# Patient Record
Sex: Female | Born: 1965 | Race: Black or African American | Hispanic: No | Marital: Single | Smoking: Former smoker
Health system: Southern US, Community
[De-identification: ages and names within clinical notes are randomized; demographics above are authoritative.]

## PROBLEM LIST (undated history)

## (undated) DIAGNOSIS — F329 Major depressive disorder, single episode, unspecified: Secondary | ICD-10-CM

## (undated) DIAGNOSIS — N898 Other specified noninflammatory disorders of vagina: Secondary | ICD-10-CM

## (undated) DIAGNOSIS — K219 Gastro-esophageal reflux disease without esophagitis: Secondary | ICD-10-CM

## (undated) DIAGNOSIS — F32A Depression, unspecified: Secondary | ICD-10-CM

## (undated) DIAGNOSIS — E119 Type 2 diabetes mellitus without complications: Secondary | ICD-10-CM

## (undated) DIAGNOSIS — D259 Leiomyoma of uterus, unspecified: Secondary | ICD-10-CM

## (undated) DIAGNOSIS — E785 Hyperlipidemia, unspecified: Secondary | ICD-10-CM

## (undated) DIAGNOSIS — I10 Essential (primary) hypertension: Secondary | ICD-10-CM

## (undated) DIAGNOSIS — Z720 Tobacco use: Secondary | ICD-10-CM

## (undated) HISTORY — DX: Major depressive disorder, single episode, unspecified: F32.9

## (undated) HISTORY — DX: Tobacco use: Z72.0

## (undated) HISTORY — DX: Essential (primary) hypertension: I10

## (undated) HISTORY — DX: Depression, unspecified: F32.A

## (undated) HISTORY — DX: Gastro-esophageal reflux disease without esophagitis: K21.9

## (undated) HISTORY — PX: OPERATIVE HYSTEROSCOPY: SUR664

## (undated) HISTORY — PX: ABDOMINAL HYSTERECTOMY: SHX81

## (undated) HISTORY — DX: Hyperlipidemia, unspecified: E78.5

## (undated) HISTORY — DX: Leiomyoma of uterus, unspecified: D25.9

## (undated) HISTORY — DX: Type 2 diabetes mellitus without complications: E11.9

## (undated) HISTORY — DX: Other specified noninflammatory disorders of vagina: N89.8

---

## 1997-11-23 ENCOUNTER — Encounter: Admission: RE | Admit: 1997-11-23 | Discharge: 1997-11-23 | Payer: Self-pay | Admitting: Obstetrics

## 1997-12-21 ENCOUNTER — Ambulatory Visit (HOSPITAL_COMMUNITY): Admission: RE | Admit: 1997-12-21 | Discharge: 1997-12-21 | Payer: Self-pay | Admitting: Obstetrics

## 1997-12-21 ENCOUNTER — Encounter: Admission: RE | Admit: 1997-12-21 | Discharge: 1997-12-21 | Payer: Self-pay | Admitting: Obstetrics

## 1997-12-26 ENCOUNTER — Inpatient Hospital Stay (HOSPITAL_COMMUNITY): Admission: AD | Admit: 1997-12-26 | Discharge: 1997-12-26 | Payer: Self-pay | Admitting: *Deleted

## 1998-11-22 ENCOUNTER — Other Ambulatory Visit: Admission: RE | Admit: 1998-11-22 | Discharge: 1998-11-22 | Payer: Self-pay | Admitting: Obstetrics

## 1998-11-22 ENCOUNTER — Encounter: Admission: RE | Admit: 1998-11-22 | Discharge: 1998-11-22 | Payer: Self-pay | Admitting: Obstetrics

## 1998-12-10 ENCOUNTER — Ambulatory Visit (HOSPITAL_COMMUNITY): Admission: RE | Admit: 1998-12-10 | Discharge: 1998-12-10 | Payer: Self-pay | Admitting: Obstetrics

## 1998-12-10 ENCOUNTER — Encounter: Payer: Self-pay | Admitting: Obstetrics

## 1998-12-20 ENCOUNTER — Encounter: Admission: RE | Admit: 1998-12-20 | Discharge: 1998-12-20 | Payer: Self-pay | Admitting: Obstetrics

## 1999-02-26 ENCOUNTER — Encounter: Admission: RE | Admit: 1999-02-26 | Discharge: 1999-02-26 | Payer: Self-pay | Admitting: Obstetrics & Gynecology

## 1999-02-28 ENCOUNTER — Encounter: Admission: RE | Admit: 1999-02-28 | Discharge: 1999-02-28 | Payer: Self-pay | Admitting: Hematology and Oncology

## 1999-04-18 ENCOUNTER — Encounter (INDEPENDENT_AMBULATORY_CARE_PROVIDER_SITE_OTHER): Payer: Self-pay | Admitting: *Deleted

## 1999-04-18 ENCOUNTER — Inpatient Hospital Stay (HOSPITAL_COMMUNITY): Admission: AD | Admit: 1999-04-18 | Discharge: 1999-04-21 | Payer: Self-pay | Admitting: Obstetrics & Gynecology

## 1999-04-30 ENCOUNTER — Encounter: Admission: RE | Admit: 1999-04-30 | Discharge: 1999-04-30 | Payer: Self-pay | Admitting: Obstetrics & Gynecology

## 1999-05-10 ENCOUNTER — Encounter: Admission: RE | Admit: 1999-05-10 | Discharge: 1999-05-10 | Payer: Self-pay | Admitting: Obstetrics & Gynecology

## 1999-05-15 ENCOUNTER — Encounter: Admission: RE | Admit: 1999-05-15 | Discharge: 1999-05-15 | Payer: Self-pay | Admitting: Hematology and Oncology

## 1999-05-31 ENCOUNTER — Encounter: Admission: RE | Admit: 1999-05-31 | Discharge: 1999-05-31 | Payer: Self-pay | Admitting: Internal Medicine

## 1999-09-18 ENCOUNTER — Encounter: Admission: RE | Admit: 1999-09-18 | Discharge: 1999-09-18 | Payer: Self-pay | Admitting: Internal Medicine

## 2000-02-11 ENCOUNTER — Encounter: Admission: RE | Admit: 2000-02-11 | Discharge: 2000-02-11 | Payer: Self-pay | Admitting: Obstetrics

## 2000-05-19 ENCOUNTER — Encounter: Admission: RE | Admit: 2000-05-19 | Discharge: 2000-05-19 | Payer: Self-pay | Admitting: Obstetrics & Gynecology

## 2000-06-10 ENCOUNTER — Inpatient Hospital Stay (HOSPITAL_COMMUNITY): Admission: AD | Admit: 2000-06-10 | Discharge: 2000-06-12 | Payer: Self-pay | Admitting: Obstetrics

## 2000-06-16 ENCOUNTER — Encounter: Admission: RE | Admit: 2000-06-16 | Discharge: 2000-06-16 | Payer: Self-pay | Admitting: Obstetrics & Gynecology

## 2000-06-23 ENCOUNTER — Ambulatory Visit (HOSPITAL_COMMUNITY): Admission: RE | Admit: 2000-06-23 | Discharge: 2000-06-23 | Payer: Self-pay

## 2000-06-29 ENCOUNTER — Inpatient Hospital Stay (HOSPITAL_COMMUNITY): Admission: AD | Admit: 2000-06-29 | Discharge: 2000-06-29 | Payer: Self-pay | Admitting: Obstetrics

## 2000-08-18 ENCOUNTER — Encounter: Admission: RE | Admit: 2000-08-18 | Discharge: 2000-08-18 | Payer: Self-pay | Admitting: Obstetrics & Gynecology

## 2000-09-03 ENCOUNTER — Inpatient Hospital Stay (HOSPITAL_COMMUNITY): Admission: AD | Admit: 2000-09-03 | Discharge: 2000-09-03 | Payer: Self-pay | Admitting: Obstetrics & Gynecology

## 2000-09-25 ENCOUNTER — Encounter: Admission: RE | Admit: 2000-09-25 | Discharge: 2000-09-25 | Payer: Self-pay | Admitting: Obstetrics & Gynecology

## 2000-09-25 ENCOUNTER — Ambulatory Visit (HOSPITAL_COMMUNITY): Admission: RE | Admit: 2000-09-25 | Discharge: 2000-09-25 | Payer: Self-pay | Admitting: Obstetrics & Gynecology

## 2000-11-24 ENCOUNTER — Encounter: Admission: RE | Admit: 2000-11-24 | Discharge: 2000-11-24 | Payer: Self-pay | Admitting: Obstetrics & Gynecology

## 2000-12-08 ENCOUNTER — Encounter: Admission: RE | Admit: 2000-12-08 | Discharge: 2000-12-08 | Payer: Self-pay | Admitting: Obstetrics & Gynecology

## 2001-01-05 ENCOUNTER — Encounter: Admission: RE | Admit: 2001-01-05 | Discharge: 2001-01-05 | Payer: Self-pay | Admitting: Obstetrics & Gynecology

## 2001-03-05 ENCOUNTER — Encounter: Admission: RE | Admit: 2001-03-05 | Discharge: 2001-03-05 | Payer: Self-pay | Admitting: Obstetrics & Gynecology

## 2001-04-06 ENCOUNTER — Encounter: Admission: RE | Admit: 2001-04-06 | Discharge: 2001-04-06 | Payer: Self-pay | Admitting: Obstetrics & Gynecology

## 2001-05-06 ENCOUNTER — Observation Stay (HOSPITAL_COMMUNITY): Admission: AD | Admit: 2001-05-06 | Discharge: 2001-05-07 | Payer: Self-pay | Admitting: Obstetrics & Gynecology

## 2001-05-21 ENCOUNTER — Encounter: Admission: RE | Admit: 2001-05-21 | Discharge: 2001-05-21 | Payer: Self-pay | Admitting: Obstetrics & Gynecology

## 2001-06-03 ENCOUNTER — Inpatient Hospital Stay (HOSPITAL_COMMUNITY): Admission: AD | Admit: 2001-06-03 | Discharge: 2001-06-03 | Payer: Self-pay | Admitting: Obstetrics & Gynecology

## 2001-07-02 ENCOUNTER — Encounter: Admission: RE | Admit: 2001-07-02 | Discharge: 2001-07-02 | Payer: Self-pay | Admitting: Obstetrics & Gynecology

## 2001-09-24 ENCOUNTER — Encounter (INDEPENDENT_AMBULATORY_CARE_PROVIDER_SITE_OTHER): Payer: Self-pay | Admitting: Pulmonary Disease

## 2001-09-24 ENCOUNTER — Encounter: Admission: RE | Admit: 2001-09-24 | Discharge: 2001-09-24 | Payer: Self-pay | Admitting: *Deleted

## 2001-09-24 ENCOUNTER — Encounter (INDEPENDENT_AMBULATORY_CARE_PROVIDER_SITE_OTHER): Payer: Self-pay | Admitting: Hospitalist

## 2001-09-24 LAB — CONVERTED CEMR LAB: Pap Smear: NORMAL

## 2001-10-08 ENCOUNTER — Encounter: Admission: RE | Admit: 2001-10-08 | Discharge: 2001-10-08 | Payer: Self-pay | Admitting: *Deleted

## 2001-10-10 ENCOUNTER — Encounter: Payer: Self-pay | Admitting: Obstetrics and Gynecology

## 2001-10-10 ENCOUNTER — Inpatient Hospital Stay (HOSPITAL_COMMUNITY): Admission: EM | Admit: 2001-10-10 | Discharge: 2001-10-25 | Payer: Self-pay | Admitting: *Deleted

## 2001-10-10 ENCOUNTER — Encounter: Payer: Self-pay | Admitting: Pulmonary Disease

## 2001-10-10 ENCOUNTER — Encounter (INDEPENDENT_AMBULATORY_CARE_PROVIDER_SITE_OTHER): Payer: Self-pay | Admitting: *Deleted

## 2001-10-11 ENCOUNTER — Encounter: Payer: Self-pay | Admitting: Family Medicine

## 2001-10-12 ENCOUNTER — Encounter: Payer: Self-pay | Admitting: Family Medicine

## 2001-10-13 ENCOUNTER — Encounter: Payer: Self-pay | Admitting: Pulmonary Disease

## 2001-10-14 ENCOUNTER — Encounter: Payer: Self-pay | Admitting: Family Medicine

## 2001-10-16 ENCOUNTER — Encounter: Payer: Self-pay | Admitting: Pulmonary Disease

## 2001-10-18 ENCOUNTER — Encounter: Payer: Self-pay | Admitting: Pulmonary Disease

## 2001-10-18 ENCOUNTER — Encounter: Admission: RE | Admit: 2001-10-18 | Discharge: 2001-10-18 | Payer: Self-pay | Admitting: Family Medicine

## 2001-10-20 ENCOUNTER — Encounter: Payer: Self-pay | Admitting: Pulmonary Disease

## 2001-10-23 ENCOUNTER — Encounter: Payer: Self-pay | Admitting: Pulmonary Disease

## 2001-10-28 ENCOUNTER — Inpatient Hospital Stay (HOSPITAL_COMMUNITY): Admission: AD | Admit: 2001-10-28 | Discharge: 2001-10-28 | Payer: Self-pay | Admitting: *Deleted

## 2001-11-30 ENCOUNTER — Encounter: Admission: RE | Admit: 2001-11-30 | Discharge: 2001-11-30 | Payer: Self-pay | Admitting: *Deleted

## 2002-05-06 ENCOUNTER — Encounter: Admission: RE | Admit: 2002-05-06 | Discharge: 2002-05-06 | Payer: Self-pay | Admitting: Internal Medicine

## 2003-04-27 ENCOUNTER — Encounter: Admission: RE | Admit: 2003-04-27 | Discharge: 2003-04-27 | Payer: Self-pay | Admitting: Obstetrics and Gynecology

## 2003-12-05 ENCOUNTER — Encounter: Admission: RE | Admit: 2003-12-05 | Discharge: 2003-12-05 | Payer: Self-pay | Admitting: Internal Medicine

## 2004-01-04 ENCOUNTER — Encounter: Admission: RE | Admit: 2004-01-04 | Discharge: 2004-01-04 | Payer: Self-pay | Admitting: Internal Medicine

## 2004-02-15 ENCOUNTER — Encounter: Admission: RE | Admit: 2004-02-15 | Discharge: 2004-02-15 | Payer: Self-pay | Admitting: Internal Medicine

## 2005-09-09 ENCOUNTER — Ambulatory Visit: Payer: Self-pay | Admitting: Internal Medicine

## 2005-11-19 ENCOUNTER — Ambulatory Visit: Payer: Self-pay | Admitting: Internal Medicine

## 2006-06-29 ENCOUNTER — Encounter (INDEPENDENT_AMBULATORY_CARE_PROVIDER_SITE_OTHER): Payer: Self-pay | Admitting: Hospitalist

## 2006-06-29 DIAGNOSIS — Z9079 Acquired absence of other genital organ(s): Secondary | ICD-10-CM | POA: Insufficient documentation

## 2006-06-29 DIAGNOSIS — D259 Leiomyoma of uterus, unspecified: Secondary | ICD-10-CM

## 2006-06-29 DIAGNOSIS — R58 Hemorrhage, not elsewhere classified: Secondary | ICD-10-CM | POA: Insufficient documentation

## 2006-06-29 DIAGNOSIS — F17201 Nicotine dependence, unspecified, in remission: Secondary | ICD-10-CM | POA: Insufficient documentation

## 2006-06-29 DIAGNOSIS — F17200 Nicotine dependence, unspecified, uncomplicated: Secondary | ICD-10-CM | POA: Insufficient documentation

## 2006-06-29 DIAGNOSIS — I10 Essential (primary) hypertension: Secondary | ICD-10-CM | POA: Insufficient documentation

## 2006-10-16 ENCOUNTER — Emergency Department (HOSPITAL_COMMUNITY): Admission: EM | Admit: 2006-10-16 | Discharge: 2006-10-17 | Payer: Self-pay | Admitting: Emergency Medicine

## 2006-11-24 ENCOUNTER — Telehealth (INDEPENDENT_AMBULATORY_CARE_PROVIDER_SITE_OTHER): Payer: Self-pay | Admitting: *Deleted

## 2006-12-01 ENCOUNTER — Encounter (INDEPENDENT_AMBULATORY_CARE_PROVIDER_SITE_OTHER): Payer: Self-pay | Admitting: Pulmonary Disease

## 2006-12-01 ENCOUNTER — Ambulatory Visit: Payer: Self-pay | Admitting: Hospitalist

## 2006-12-01 DIAGNOSIS — K219 Gastro-esophageal reflux disease without esophagitis: Secondary | ICD-10-CM

## 2006-12-01 LAB — CONVERTED CEMR LAB
BUN: 10 mg/dL (ref 6–23)
CO2: 27 meq/L (ref 19–32)
Calcium: 9.3 mg/dL (ref 8.4–10.5)
Chloride: 102 meq/L (ref 96–112)
Creatinine, Ser: 0.85 mg/dL (ref 0.40–1.20)
Glucose, Bld: 100 mg/dL — ABNORMAL HIGH (ref 70–99)
HCT: 45.1 % (ref 36.0–46.0)
Hemoglobin: 14.4 g/dL (ref 12.0–15.0)
MCHC: 31.9 g/dL (ref 30.0–36.0)
MCV: 98.3 fL (ref 78.0–100.0)
Platelets: 264 10*3/uL (ref 150–400)
Potassium: 4.4 meq/L (ref 3.5–5.3)
RBC: 4.59 M/uL (ref 3.87–5.11)
RDW: 13.7 % (ref 11.5–14.0)
Sodium: 138 meq/L (ref 135–145)
WBC: 8.1 10*3/uL (ref 4.0–10.5)

## 2007-09-06 ENCOUNTER — Emergency Department (HOSPITAL_COMMUNITY): Admission: EM | Admit: 2007-09-06 | Discharge: 2007-09-06 | Payer: Self-pay | Admitting: Family Medicine

## 2007-12-21 ENCOUNTER — Telehealth: Payer: Self-pay | Admitting: *Deleted

## 2007-12-27 ENCOUNTER — Encounter (INDEPENDENT_AMBULATORY_CARE_PROVIDER_SITE_OTHER): Payer: Self-pay | Admitting: *Deleted

## 2007-12-27 ENCOUNTER — Ambulatory Visit: Payer: Self-pay | Admitting: *Deleted

## 2007-12-27 LAB — CONVERTED CEMR LAB
BUN: 5 mg/dL — ABNORMAL LOW (ref 6–23)
Bilirubin Urine: NEGATIVE
CO2: 28 meq/L (ref 19–32)
Calcium: 10 mg/dL (ref 8.4–10.5)
Chloride: 99 meq/L (ref 96–112)
Creatinine, Ser: 0.67 mg/dL (ref 0.40–1.20)
Glucose, Bld: 105 mg/dL — ABNORMAL HIGH (ref 70–99)
Hemoglobin, Urine: NEGATIVE
Ketones, ur: NEGATIVE mg/dL
Leukocytes, UA: NEGATIVE
Nitrite: NEGATIVE
Potassium: 3.4 meq/L — ABNORMAL LOW (ref 3.5–5.3)
Protein, ur: NEGATIVE mg/dL
Sodium: 142 meq/L (ref 135–145)
Specific Gravity, Urine: 1.007 (ref 1.005–1.03)
Urine Glucose: NEGATIVE mg/dL
Urobilinogen, UA: 0.2 (ref 0.0–1.0)
pH: 7.5 (ref 5.0–8.0)

## 2008-03-17 ENCOUNTER — Emergency Department (HOSPITAL_COMMUNITY): Admission: EM | Admit: 2008-03-17 | Discharge: 2008-03-17 | Payer: Self-pay | Admitting: Family Medicine

## 2008-04-29 ENCOUNTER — Emergency Department (HOSPITAL_COMMUNITY): Admission: EM | Admit: 2008-04-29 | Discharge: 2008-04-29 | Payer: Self-pay | Admitting: Family Medicine

## 2008-07-26 ENCOUNTER — Telehealth (INDEPENDENT_AMBULATORY_CARE_PROVIDER_SITE_OTHER): Payer: Self-pay | Admitting: Internal Medicine

## 2008-08-01 ENCOUNTER — Ambulatory Visit: Payer: Self-pay | Admitting: Infectious Disease

## 2008-08-01 ENCOUNTER — Encounter (INDEPENDENT_AMBULATORY_CARE_PROVIDER_SITE_OTHER): Payer: Self-pay | Admitting: Internal Medicine

## 2008-08-02 DIAGNOSIS — E785 Hyperlipidemia, unspecified: Secondary | ICD-10-CM | POA: Insufficient documentation

## 2008-08-02 LAB — CONVERTED CEMR LAB
Cholesterol: 303 mg/dL — ABNORMAL HIGH (ref 0–200)
HDL: 36 mg/dL — ABNORMAL LOW (ref >39)
LDL Cholesterol: 233 mg/dL — ABNORMAL HIGH (ref 0–99)
Total CHOL/HDL Ratio: 8.4
Triglycerides: 170 mg/dL — ABNORMAL HIGH (ref <150)
VLDL: 34 mg/dL (ref 0–40)

## 2008-08-21 ENCOUNTER — Telehealth (INDEPENDENT_AMBULATORY_CARE_PROVIDER_SITE_OTHER): Payer: Self-pay | Admitting: Internal Medicine

## 2008-11-25 ENCOUNTER — Emergency Department (HOSPITAL_COMMUNITY): Admission: EM | Admit: 2008-11-25 | Discharge: 2008-11-25 | Payer: Self-pay | Admitting: Family Medicine

## 2009-03-09 ENCOUNTER — Ambulatory Visit: Payer: Self-pay | Admitting: Internal Medicine

## 2009-03-09 LAB — CONVERTED CEMR LAB
ALT: 8 units/L (ref 0–35)
AST: 10 units/L (ref 0–37)
Albumin: 4.6 g/dL (ref 3.5–5.2)
Alkaline Phosphatase: 59 units/L (ref 39–117)
BUN: 7 mg/dL (ref 6–23)
CO2: 27 meq/L (ref 19–32)
Calcium: 9.9 mg/dL (ref 8.4–10.5)
Candida species: NEGATIVE
Chloride: 99 meq/L (ref 96–112)
Cholesterol: 339 mg/dL — ABNORMAL HIGH (ref 0–200)
Creatinine, Ser: 0.69 mg/dL (ref 0.40–1.20)
Gardnerella vaginalis: NEGATIVE
Glucose, Bld: 91 mg/dL (ref 70–99)
HDL: 41 mg/dL (ref >39)
LDL Cholesterol: 266 mg/dL — ABNORMAL HIGH (ref 0–99)
Potassium: 4.2 meq/L (ref 3.5–5.3)
Sodium: 137 meq/L (ref 135–145)
Total Bilirubin: 0.4 mg/dL (ref 0.3–1.2)
Total CHOL/HDL Ratio: 8.3
Total Protein: 8.1 g/dL (ref 6.0–8.3)
Triglycerides: 161 mg/dL — ABNORMAL HIGH (ref <150)
VLDL: 32 mg/dL (ref 0–40)

## 2009-06-21 ENCOUNTER — Telehealth (INDEPENDENT_AMBULATORY_CARE_PROVIDER_SITE_OTHER): Payer: Self-pay | Admitting: Internal Medicine

## 2009-09-05 ENCOUNTER — Emergency Department (HOSPITAL_COMMUNITY): Admission: EM | Admit: 2009-09-05 | Discharge: 2009-09-05 | Payer: Self-pay | Admitting: Emergency Medicine

## 2009-09-26 ENCOUNTER — Emergency Department (HOSPITAL_COMMUNITY): Admission: EM | Admit: 2009-09-26 | Discharge: 2009-09-26 | Payer: Self-pay | Admitting: Family Medicine

## 2009-11-06 ENCOUNTER — Ambulatory Visit: Payer: Self-pay | Admitting: Internal Medicine

## 2009-11-09 ENCOUNTER — Telehealth: Payer: Self-pay | Admitting: *Deleted

## 2009-11-09 LAB — CONVERTED CEMR LAB
ALT: 9 units/L (ref 0–35)
AST: 10 units/L (ref 0–37)
Albumin: 4.7 g/dL (ref 3.5–5.2)
Alkaline Phosphatase: 56 units/L (ref 39–117)
BUN: 10 mg/dL (ref 6–23)
CO2: 24 meq/L (ref 19–32)
Calcium: 10 mg/dL (ref 8.4–10.5)
Candida species: NEGATIVE
Chlamydia, DNA Probe: NEGATIVE
Chloride: 101 meq/L (ref 96–112)
Cholesterol: 333 mg/dL — ABNORMAL HIGH (ref 0–200)
Creatinine, Ser: 0.69 mg/dL (ref 0.40–1.20)
GC Probe Amp, Genital: NEGATIVE
Gardnerella vaginalis: POSITIVE — AB
Glucose, Bld: 85 mg/dL (ref 70–99)
HDL: 42 mg/dL (ref >39)
LDL Cholesterol: 266 mg/dL — ABNORMAL HIGH (ref 0–99)
Potassium: 4.3 meq/L (ref 3.5–5.3)
Sodium: 137 meq/L (ref 135–145)
TSH: 0.86 microintl units/mL (ref 0.350–4.5)
Total Bilirubin: 0.4 mg/dL (ref 0.3–1.2)
Total CHOL/HDL Ratio: 7.9
Total Protein: 7.8 g/dL (ref 6.0–8.3)
Triglycerides: 123 mg/dL (ref <150)
VLDL: 25 mg/dL (ref 0–40)

## 2009-11-20 ENCOUNTER — Telehealth (INDEPENDENT_AMBULATORY_CARE_PROVIDER_SITE_OTHER): Payer: Self-pay | Admitting: Internal Medicine

## 2010-01-24 ENCOUNTER — Emergency Department (HOSPITAL_COMMUNITY): Admission: EM | Admit: 2010-01-24 | Discharge: 2010-01-24 | Payer: Self-pay | Admitting: Family Medicine

## 2010-02-06 ENCOUNTER — Telehealth: Payer: Self-pay | Admitting: Internal Medicine

## 2010-03-15 ENCOUNTER — Telehealth: Payer: Self-pay | Admitting: Internal Medicine

## 2010-04-26 ENCOUNTER — Emergency Department (HOSPITAL_COMMUNITY): Admission: EM | Admit: 2010-04-26 | Discharge: 2010-04-26 | Payer: Self-pay | Admitting: Family Medicine

## 2010-06-13 ENCOUNTER — Telehealth (INDEPENDENT_AMBULATORY_CARE_PROVIDER_SITE_OTHER): Payer: Self-pay | Admitting: *Deleted

## 2010-06-29 ENCOUNTER — Emergency Department (HOSPITAL_COMMUNITY)
Admission: EM | Admit: 2010-06-29 | Discharge: 2010-06-29 | Payer: Self-pay | Source: Home / Self Care | Admitting: Family Medicine

## 2010-07-11 ENCOUNTER — Telehealth (INDEPENDENT_AMBULATORY_CARE_PROVIDER_SITE_OTHER): Payer: Self-pay | Admitting: *Deleted

## 2010-07-18 ENCOUNTER — Telehealth: Payer: Self-pay | Admitting: Internal Medicine

## 2010-07-22 ENCOUNTER — Ambulatory Visit: Admission: RE | Admit: 2010-07-22 | Discharge: 2010-07-22 | Payer: Self-pay | Source: Home / Self Care

## 2010-07-22 DIAGNOSIS — F329 Major depressive disorder, single episode, unspecified: Secondary | ICD-10-CM | POA: Insufficient documentation

## 2010-07-22 LAB — CONVERTED CEMR LAB
ALT: <8 units/L (ref 0–35)
AST: 11 units/L (ref 0–37)
Albumin: 4.8 g/dL (ref 3.5–5.2)
Alkaline Phosphatase: 54 units/L (ref 39–117)
BUN: 10 mg/dL (ref 6–23)
CO2: 28 meq/L (ref 19–32)
Calcium: 9.9 mg/dL (ref 8.4–10.5)
Chloride: 98 meq/L (ref 96–112)
Cholesterol: 292 mg/dL — ABNORMAL HIGH (ref 0–200)
Creatinine, Ser: 0.75 mg/dL (ref 0.40–1.20)
Glucose, Bld: 98 mg/dL (ref 70–99)
HCT: 45.3 % (ref 36.0–46.0)
HDL: 40 mg/dL (ref >39)
HIV: NONREACTIVE
Hemoglobin: 15.3 g/dL — ABNORMAL HIGH (ref 12.0–15.0)
LDL Cholesterol: 220 mg/dL — ABNORMAL HIGH (ref 0–99)
MCHC: 33.8 g/dL (ref 30.0–36.0)
MCV: 94 fL (ref 78.0–100.0)
Platelets: 278 10*3/uL (ref 150–400)
Potassium: 3.8 meq/L (ref 3.5–5.3)
RBC: 4.82 M/uL (ref 3.87–5.11)
RDW: 12.9 % (ref 11.5–15.5)
Sodium: 140 meq/L (ref 135–145)
Total Bilirubin: 0.2 mg/dL — ABNORMAL LOW (ref 0.3–1.2)
Total CHOL/HDL Ratio: 7.3
Total Protein: 7.8 g/dL (ref 6.0–8.3)
Triglycerides: 158 mg/dL — ABNORMAL HIGH (ref <150)
VLDL: 32 mg/dL (ref 0–40)
WBC: 9.6 10*3/uL (ref 4.0–10.5)

## 2010-07-27 ENCOUNTER — Emergency Department (HOSPITAL_COMMUNITY)
Admission: EM | Admit: 2010-07-27 | Discharge: 2010-07-27 | Payer: Self-pay | Source: Home / Self Care | Admitting: Family Medicine

## 2010-07-29 LAB — WET PREP, GENITAL
Trich, Wet Prep: NONE SEEN
Yeast Wet Prep HPF POC: NONE SEEN

## 2010-07-29 LAB — POCT URINALYSIS DIPSTICK
Bilirubin Urine: NEGATIVE
Hgb urine dipstick: NEGATIVE
Ketones, ur: NEGATIVE mg/dL
Nitrite: NEGATIVE
Protein, ur: NEGATIVE mg/dL
Specific Gravity, Urine: <=1.005 (ref 1.005–1.030)
Urine Glucose, Fasting: NEGATIVE mg/dL
Urobilinogen, UA: 0.2 mg/dL (ref 0.0–1.0)
pH: 7 (ref 5.0–8.0)

## 2010-07-31 LAB — GC/CHLAMYDIA PROBE AMP, GENITAL
Chlamydia, DNA Probe: NEGATIVE
GC Probe Amp, Genital: NEGATIVE

## 2010-07-31 LAB — URINE CULTURE
Colony Count: 25000
Culture  Setup Time: 201201142021

## 2010-08-05 ENCOUNTER — Encounter: Payer: Self-pay | Admitting: Internal Medicine

## 2010-08-13 ENCOUNTER — Telehealth (INDEPENDENT_AMBULATORY_CARE_PROVIDER_SITE_OTHER): Payer: Self-pay | Admitting: *Deleted

## 2010-08-15 NOTE — Progress Notes (Signed)
Summary: Yeast infection  Phone Note Call from Patient Message from:  Patient on July 11, 2010 1:40 PM  Caller: Patient Call For: Whitney Post MD Summary of Call: Message  from would like to get something for a yeast infection.  Pt said that she has recently been on Metronitazole.  Attempts to call pt to ask about her discharge.  Having the cottage cheese like discharge.  Had gone to Urgent Care got an antibiotic for BV.  Pt said that she is having the vaginal itching as well  Pt uses the [facility] on [address]. Angelina Ok RN  July 11, 2010 1:48 PM  Initial call taken by: Angelina Ok RN,  July 11, 2010 1:43 PM  Follow-up for Phone Call        Will give one dose of Diflucan -if not better she needs to come in for evaluation Follow-up by: Julaine Fusi  DO,  July 11, 2010 1:47 PM    Prescriptions: FLUCONAZOLE 150 MG TABS (FLUCONAZOLE) take one by mouth, may repeat in 3 days if still having symptoms  #2 x 0   Entered and Authorized by:   Julaine Fusi  DO   Signed by:   Julaine Fusi  DO on 07/11/2010   Method used:   Electronically to        [facility] [phone]* (retail)       [address]       [place], [place]  [address]       Ph: [phone]       Fax: [phone]   RxID:   [id number]

## 2010-08-15 NOTE — Assessment & Plan Note (Signed)
Summary: EST-CK/FU/MEDS/CFB   Vital Signs:  Patient profile:   45 year old female Height:      65 inches (165.10 cm) Weight:      161.0 pounds (73.18 kg) BMI:     26.89 Temp:     98.8 degrees F (37.11 degrees C) oral Pulse rate:   93 / minute BP sitting:   129 / 93  (right arm)  Vitals Entered By: Chinita Pester RN (November 06, 2009 3:47 PM) CC: Having vaginal discharge (re-occurring).  Also med. refill. Is Patient Diabetic? No Pain Assessment Patient in pain? no      Nutritional Status BMI of 25 - 29 = overweight  Have you ever been in a relationship where you felt threatened, hurt or afraid?No   Does patient need assistance? Functional Status Self care Ambulation Normal   CC:  Having vaginal discharge (re-occurring).  Also med. refill.Marland Kitchen  History of Present Illness: 43yof with pmh of HLD, HTN, smoking is here for check up so that she can get refills.  Her major concern is vaginal discharge that has been coming and going for 7 months.  She has been treated for both yeast and BV at urgent care, which works for a little while and then the discharge comes back.  She has no fevers, chills or abd pain.  She has had a hysterectomy.  Otherwise no complaints.    Preventive Screening-Counseling & Management  Alcohol-Tobacco     Alcohol drinks/day: 0     Smoking Status: current     Smoking Cessation Counseling: yes     Packs/Day: 0.5     Year Started: smoking x 20 yrs  Caffeine-Diet-Exercise     Does Patient Exercise: yes     Type of exercise: WALKING  Current Medications (verified): 1)  Hydrochlorothiazide 25 Mg Tabs (Hydrochlorothiazide) .... Take 1 Tablet By Mouth Once A Day 2)  Lisinopril 10 Mg Tabs (Lisinopril) .... Take 1 Tablet By Mouth Once A Day 3)  Prilosec 20 Mg Cpdr (Omeprazole) .... Take 1 Tablet By Mouth Once Daily  Allergies: 1)  Metoprolol Tartrate (Metoprolol Tartrate)  Review of Systems       per hpi  Physical Exam  General:  alert and  well-developed.   Lungs:  normal respiratory effort and normal breath sounds.   Heart:  normal rate, regular rhythm, no murmur, and no gallop.   Genitalia:  normal introitus, no external lesions, and vaginal discharge.  discharge is thick and white without odor.  uterus is abcent.  no ulcerations. There is a cuff scar.   Impression & Recommendations:  Problem # 1:  VAGINITIS (ICD-616.10)  discharge on and off for 7 months.  has been treated repeatedly for BV and yeast. has had hysterectomy. sexually active with one partner only, and has been with him for a long time will check wet prep, gc and UA.  Orders: T-Urinalysis Dipstick only ([id number]) T-Wet Prep by Molecular Probe [phone]) T-Chlamydia & GC Probe, Genital ([id number])  Problem # 2:  HYPERLIPIDEMIA (ICD-272.4) had been on pravachol... but has not been on it for a long time. will check lipids and cmet today.  The following medications were removed from the medication list:    Pravachol 40 Mg Tabs (Pravastatin sodium) .Marland Kitchen... Take one tablet daily to lower cholesterol.  Orders: T-Lipid Profile ([id number]) T-Comprehensive Metabolic Panel ([id number])  Problem # 3:  SMOKER (ICD-305.1) still smoking about 1/2 ppd. we discussed this at length.  encouraged cessation, she will think about it.  Problem # 4:  HYPERTENSION (ICD-401.9) has not had lisinopril for a week.  bp is usually lower at work.   Her updated medication list for this problem includes:    Hydrochlorothiazide 25 Mg Tabs (Hydrochlorothiazide) .Marland Kitchen... Take 1 tablet by mouth once a day    Lisinopril 10 Mg Tabs (Lisinopril) .Marland Kitchen... Take 1 tablet by mouth once a day  BP today: 129/93 Prior BP: 109/74 (03/09/2009)  Labs Reviewed: K+: 4.2 (03/09/2009) Creat: : 0.69 (03/09/2009)   Chol: 339 (03/09/2009)   HDL: 41 (03/09/2009)   LDL: 266 (03/09/2009)   TG: 161 (03/09/2009)  Complete Medication List: 1)  Hydrochlorothiazide 25 Mg Tabs (Hydrochlorothiazide)  .... Take 1 tablet by mouth once a day 2)  Lisinopril 10 Mg Tabs (Lisinopril) .... Take 1 tablet by mouth once a day 3)  Prilosec 20 Mg Cpdr (Omeprazole) .... Take 1 tablet by mouth once daily  Other Orders: T-Hgb A1C (in-house) ([id number]) T-TSH [phone])  Patient Instructions: 1)  You had labwork done today, we will call you if there is anything that needs to be addressed. 2)  Please schedule a follow-up appointment in 3 months. 3)  Tobacco is very bad for your health and your loved ones! You Should stop smoking!. Prescriptions: LISINOPRIL 10 MG TABS (LISINOPRIL) Take 1 tablet by mouth once a day  #31 x 6   Entered and Authorized by:   Elby Showers MD   Signed by:   Elby Showers MD on 11/06/2009   Method used:   Electronically to        [facility]  [address] [phone]* (retail)       [address]       [place], [place]  [address]       Ph: [phone]       Fax: [phone]   RxID:   [id number] HYDROCHLOROTHIAZIDE 25 MG TABS (HYDROCHLOROTHIAZIDE) Take 1 tablet by mouth once a day  #31 x 6   Entered and Authorized by:   Elby Showers MD   Signed by:   Elby Showers MD on 11/06/2009   Method used:   Electronically to        [facility]  [address] [phone]* (retail)       [address]       [place], [place]  [address]       Ph: [phone]       Fax: [phone]   RxID:   [id number]   Prevention & Chronic Care Immunizations   Influenza vaccine: Not documented    Tetanus booster: 03/09/2009: Td    Pneumococcal vaccine: Not documented  Other Screening   Pap smear: Normal  (09/24/2001)   Pap smear action/deferral: Not indicated S/P hysterectomy  (11/06/2009)    Mammogram: Not documented   Mammogram action/deferral: Ordered  (03/09/2009)   Smoking status: current  (11/06/2009)   Smoking cessation counseling: yes  (11/06/2009)  Lipids   Total Cholesterol: 339  (03/09/2009)   Lipid panel action/deferral: Lipid  Panel ordered   LDL: 266  (03/09/2009)   LDL Direct: Not documented   HDL: 41  (03/09/2009)   Triglycerides: 161  (03/09/2009)    SGOT (AST): 10  (03/09/2009)   BMP action: Ordered   SGPT (ALT): 8  (03/09/2009) CMP ordered    Alkaline phosphatase: 59  (03/09/2009)   Total bilirubin: 0.4  (03/09/2009)    Lipid flowsheet reviewed?: Yes  Progress toward LDL goal: Unchanged  Hypertension   Last Blood Pressure: 129 / 93  (11/06/2009)   Serum creatinine: 0.69  (03/09/2009)   Serum potassium 4.2  (03/09/2009) CMP ordered     Hypertension flowsheet reviewed?: Yes   Progress toward BP goal: Deteriorated  Self-Management Support :   Personal Goals (by the next clinic visit) :      Personal blood pressure goal: 140/90  (11/06/2009)     Personal LDL goal: 100  (11/06/2009)    Patient will work on the following items until the next clinic visit to reach self-care goals:     Medications and monitoring: take my medicines every day, check my blood pressure, bring all of my medications to every visit  (11/06/2009)     Eating: eat more vegetables, use fresh or frozen vegetables, eat foods that are low in salt, eat baked foods instead of fried foods, eat fruit for snacks and desserts  (11/06/2009)     Activity: take a 30 minute walk every day, take the stairs instead of the elevator, park at the far end of the parking lot  (11/06/2009)    Hypertension self-management support: Education handout, Resources for patients handout, Written self-care plan  (11/06/2009)   Hypertension self-care plan printed.   Hypertension education handout printed    Lipid self-management support: Education handout, Resources for patients handout, Written self-care plan  (11/06/2009)   Lipid self-care plan printed.   Lipid education handout printed      Resource handout printed.   Nursing Instructions: Pap smear today   Process Orders Check Orders Results:     [facility]: ABN not required  for this insurance Tests Sent for requisitioning (November 06, 2009 4:43 PM):     11/06/2009: [facility] -- T-Lipid Profile [phone] (signed)     11/06/2009: [facility] -- T-Comprehensive Metabolic Panel [[id number]] (signed)     11/06/2009: [facility] -- T-TSH [phone] (signed)     11/06/2009: [facility] -- T-Wet Prep by Molecular Probe [phone] (signed)     11/06/2009: [facility] -- T-Chlamydia & GC Probe, Genital [[id number]] (signed)    Appended Document: EST-CK/FU/MEDS/CFB  Laboratory Results   Urine Tests  Date/Time Recieved: 04.26.11   4:40pm Date/Time Reported: 04.26.11   4:40pm  Routine Urinalysis   Color: lt. yellow Glucose: negative   (Normal Range: Negative) Bilirubin: negative   (Normal Range: Negative) Ketone: negative   (Normal Range: Negative) Spec. Gravity: 1.010   (Normal Range: 1.003-1.035) Blood: trace-intact   (Normal Range: Negative) pH: 5.0   (Normal Range: 5.0-8.0) Protein: negative   (Normal Range: Negative) Urobilinogen: 0.2   (Normal Range: 0-1) Nitrite: negative   (Normal Range: Negative) Leukocyte Esterace: small   (Normal Range: Negative)        Appended Document: Lab Order/a1c results    Lab Visit  Laboratory Results   Blood Tests   Date/Time Received: November 06, 2009 4:57 PM Date/Time Reported: Alric Quan  November 06, 2009 4:57 PM   HGBA1C: 5.3%   (Normal Range: Non-Diabetic - 3-6%   Control Diabetic - 6-8%)    Orders Today:   Appended Document: EST-CK/FU/MEDS/CFB Lisinopril and HCTZ  Rxs. called to [facility] pharmacy on [address] per pt.'s request.

## 2010-08-15 NOTE — Progress Notes (Signed)
Summary: Refill/gh  Phone Note Refill Request Message from:  Patient on July 18, 2010 9:51 AM  Refills Requested: Medication #1:  LISINOPRIL 10 MG TABS Take 1 tablet by mouth once a day  Medication #2:  PRAVACHOL 40 MG TABS Take one tablet daily to reduce cholesterol. Completely out of these meds.  Has an appointment on Monday.  Would like enough until the visit.  Last visit with labs was 11/06/2009.   Method Requested: Electronic Initial call taken by: Angelina Ok RN,  July 18, 2010 9:51 AM    Prescriptions: PRAVACHOL 40 MG TABS (PRAVASTATIN SODIUM) Take one tablet daily to reduce cholesterol.  #30 x 0   Entered and Authorized by:   Blanch Media MD   Signed by:   Blanch Media MD on 07/18/2010   Method used:   Electronically to        [facility] [phone]* (retail)       [address]       [place], [place]  [address]       Ph: [phone]       Fax: [phone]   RxID:   [id number] LISINOPRIL 10 MG TABS (LISINOPRIL) Take 1 tablet by mouth once a day  #30 x 0   Entered and Authorized by:   Blanch Media MD   Signed by:   Blanch Media MD on 07/18/2010   Method used:   Electronically to        [facility] [phone]* (retail)       [address]       [place], [place]  [address]       Ph: [phone]       Fax: [phone]   RxID:   [id number]

## 2010-08-15 NOTE — Progress Notes (Signed)
Summary: Yeast Infection  Phone Note Call from Patient   Caller: Patient Call For: Whitney Post MD Complaint: Abdominal Pain Action Taken: Provider Notified Summary of Call: Call from pt.  Said that she has a yeast infection-was tpld not to use the over the counter medication.  Pt was told to call for yeast medicine. Angelina Ok RN  February 06, 2010 10:54 AM  Initial call taken by: Angelina Ok RN,  February 06, 2010 10:54 AM  Follow-up for Phone Call        Diflucan 150 mg once.  Call in one tab.  If not effective, should be seen. Follow-up by: Ulyess Mort MD,  February 06, 2010 3:06 PM  Additional Follow-up for Phone Call Additional follow up Details #1::        Diflucan 150 mg tablet called to the [facility] 1 tablet by mouth x 1 with no refills.  Pt was called and informed that Diflucan 150 mg tablet had been called in.  If no relief she will need to come in for an appointment. Angelina Ok RN  February 07, 2010 11:42 AM  Additional Follow-up by: Angelina Ok RN,  February 07, 2010 11:42 AM

## 2010-08-15 NOTE — Assessment & Plan Note (Addendum)
Summary: Est-ck/fu/meds/cfb   Vital Signs:  Patient profile:   45 year old female Height:      65 inches (165.10 cm) Weight:      167.6 pounds (76.18 kg) BMI:     27.99 Temp:     97.1 degrees F (36.17 degrees C) oral Pulse rate:   105 / minute BP sitting:   129 / 77  (left arm)  Vitals Entered By: Stanton Kidney Ditzler RN (July 22, 2010 1:25 PM) CC: vaginal itching, burning Is Patient Diabetic? No Pain Assessment Patient in pain? no      Nutritional Status BMI of 25 - 29 = overweight Nutritional Status Detail appetite good  Have you ever been in a relationship where you felt threatened, hurt or afraid?denies   Does patient need assistance? Functional Status Self care Ambulation Normal Comments Past 3 days white vag discharge with itching. Has yeast odor.   Primary Care Provider:  Whitney Post MD  CC:  vaginal itching and burning.  History of Present Illness: 45yo W with HTN, HL, recurrent BV presents for evaluation of: 1. vaginal yeast infection? Patient has had multiple past yeast infections. She recently began experiencing vaginal itching/burning with cottage cheese white discharge around 12/29 after completing a course of flagyl for BV. She is very familiar with the symptoms of yeast infection and has had at least 3 other infections in the past 12 months. She was previously seen by urgent care in december -- pelvic exam was done with negative GC/Ch cultures. She reports being last tested for HIV one year ago, which was negative. She has one sexual partner, who she has been with exclusively for several years.  2. depression. She has recently been taking st johns wort (600mg  in the morning and at night) for tx of depressive symptoms (she has not noticed a difference with st johns wort). She says that for the past 4 months, she has felt more "emotional", cries easily, and has had some depressed mood. Depression runs in her family. She also reports sleeping more, having trouble  getting out of bed, eating more. She denies SI/HI.   Depression History:      The patient denies a depressed mood most of the day and a diminished interest in her usual daily activities.         Preventive Screening-Counseling & Management  Alcohol-Tobacco     Alcohol drinks/day: 0     Smoking Status: current     Smoking Cessation Counseling: yes     Packs/Day: 12-14 cigs per day     Year Started: smoking x 20 yrs  Caffeine-Diet-Exercise     Does Patient Exercise: yes     Type of exercise: WALKING     Exercise (avg: min/session): 30-60     Times/week: 5  Current Medications (verified): 1)  Hydrochlorothiazide 25 Mg Tabs (Hydrochlorothiazide) .... Take 1 Tablet By Mouth Once A Day 2)  Lisinopril 10 Mg Tabs (Lisinopril) .... Take 1 Tablet By Mouth Once A Day 3)  Prilosec 20 Mg Cpdr (Omeprazole) .... Take 1 Tablet By Mouth Once Daily 4)  Pravachol 40 Mg Tabs (Pravastatin Sodium) .... Take One Tablet Daily To Reduce Cholesterol. 5)  Metronidazole 500 Mg Tabs (Metronidazole) .... Take One Tablet Two Times A Day For 7 Days. 6)  Fluconazole 150 Mg Tabs (Fluconazole) .... 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 CV:  Denies chest pain or discomfort. GI:  Denies abdominal pain and change in bowel habits. GU:  Complains of discharge; denies abnormal vaginal bleeding and genital sores. Derm:  Complains of itching.  Physical Exam  General:  alert and cooperative to examination.   Head:  normocephalic and atraumatic.   Eyes:  vision grossly intact, pupils equal, pupils round, and pupils reactive to light.   Mouth:  pharynx pink and moist.   Neck:  supple.   Lungs:  normal breath sounds, no crackles, and no wheezes.   Heart:  normal rate, regular rhythm, no murmur, no gallop, and no rub.   Abdomen:  soft and non-tender.   Genitalia:  normal introitus and no external lesions. small amount of cottage cheese like vaginal discharge.  Msk:  normal ROM.   Extremities:  No edema.  Neurologic:  alert & oriented X3, cranial nervesgrossly intact, strength normal in all extremities, and sensation intact to light touch.   Skin:  turgor normal and no rashes.   Psych:  Oriented X3, memory intact for recent and remote, normally interactive, good eye contact, not anxious appearing, and not depressed appearing.     Impression & Recommendations:  Problem # 1:  VAGINITIS (ICD-616.10) Given recurrent candidal vaginitis, will prescribe weekly fluconazole for next several months in an effort to prevent recurrences. Will also recheck HIV Ab.   Her updated medication list for this problem includes:    Metronidazole 500 Mg Tabs (Metronidazole) .Marland Kitchen... Take one tablet two times a day for 7 days.  Orders: T-CBC No Diff ([id number]) T-HIV Antibody  (Reflex) ([id number])  Problem # 2:  HYPERLIPIDEMIA (ICD-272.4) Will check lipids today, although patient not fasting (reports difficulty returning for fasting lipid panel b/c of work).   Her updated medication list for this problem includes:    Pravachol 40 Mg Tabs (Pravastatin sodium) .Marland Kitchen... Take one tablet daily to reduce cholesterol.  Orders: T-Comprehensive  Metabolic Panel [phone]) T-Lipid Profile [id number])  Problem # 3:  HYPERTENSION (ICD-401.9) Controlled. Continue current managment. Will check CMET today.   Her updated medication list for this problem includes:    Hydrochlorothiazide 25 Mg Tabs (Hydrochlorothiazide) .Marland Kitchen... Take 1 tablet by mouth once a day    Lisinopril 10 Mg Tabs (Lisinopril) .Marland Kitchen... Take 1 tablet by mouth once a day  Orders: T-Comprehensive Metabolic Panel ([id number])  Problem # 4:  DEPRESSION (ICD-311) Patient reports depressive symptoms for past 4 months, not helped with [facility]. Patient advised of possible drug interactions with [facility] Wort -- she will consider stopping it. She is not ready to start antidepressant at this time but she will consider for the future.   Complete Medication List: 1)  Hydrochlorothiazide 25 Mg Tabs (Hydrochlorothiazide) .... Take 1 tablet by mouth once a day 2)  Lisinopril 10 Mg Tabs (Lisinopril) .... Take 1 tablet  by mouth once a day 3)  Prilosec 20 Mg Cpdr (Omeprazole) .... Take 1 tablet by mouth once daily 4)  Pravachol 40 Mg Tabs (Pravastatin sodium) .... Take one tablet daily to reduce cholesterol. 5)  Metronidazole 500 Mg Tabs (Metronidazole) .... Take one tablet two times a day for 7 days. 6)  Fluconazole 150 Mg Tabs (Fluconazole) .... Take one by mouth on day 1, take one by mouth on day 4, take one by mouth on day 7, then take one pill every week  Patient Instructions: 1)  Please schedule a follow-up appointment in 6 months. Prescriptions: FLUCONAZOLE 150 MG TABS (FLUCONAZOLE) take one by mouth on day 1, take one by mouth on day 4, take one by mouth on day 7, then take one pill every week  #6 x 4   Entered and Authorized by:   Whitney Post MD   Signed by:   Whitney Post MD on 07/22/2010   Method used:   Electronically to        [facility] [phone]* (retail)       [address]       [place], [place]  [address]       Ph: [phone]       Fax:  [phone]   RxID:   [id number] PRAVACHOL 40 MG TABS (PRAVASTATIN SODIUM) Take one tablet daily to reduce cholesterol.  #30 x 6   Entered and Authorized by:   Whitney Post MD   Signed by:   Whitney Post MD on 07/22/2010   Method used:   Electronically to        [facility] [phone]* (retail)       [address]       [place], [place]  [address]       Ph: [phone]       Fax: [phone]   RxID:   [id number] LISINOPRIL 10 MG TABS (LISINOPRIL) Take 1 tablet by mouth once a day  #30 x 6   Entered and Authorized by:   Whitney Post MD   Signed by:   Whitney Post MD on 07/22/2010   Method used:   Electronically to        [facility] [phone]* (retail)       [address]       [place], [place]  [address]       Ph: [phone]       Fax: [phone]   RxID:   [id number] HYDROCHLOROTHIAZIDE 25 MG TABS (HYDROCHLOROTHIAZIDE) Take 1 tablet by mouth once a day  #31 x 6   Entered and Authorized by:   Whitney Post MD   Signed by:   Whitney Post MD on 07/22/2010   Method used:   Electronically to        [facility] [phone]* (retail)       [address]       [place], [place]  [address]       Ph: [phone]       Fax: [phone]   RxID:   [id number]    Orders Added: 1)  T-Comprehensive Metabolic Panel [[id number]] 2)  T-CBC No Diff [[id number]] 3)  T-HIV Antibody  (Reflex) [[id number]] 4)  T-Lipid Profile [[id number]] 5)  Est. Patient Level IV [[address]]   Process Orders Check Orders Results:     [facility]: ABN not required for this insurance Tests Sent for requisitioning (July 22, 2010 11:07 PM):     07/22/2010: [facility] -- [employer] manager (signed)     07/22/2010: [facility] -- T-CBC No Diff [[id number]] (signed)     07/22/2010: [facility] -- T-HIV Antibody  (Reflex) [[id number]] (signed)     07/22/2010: [facility]  -- T-Lipid Profile [phone] (signed)     Prevention & Chronic Care Immunizations   Influenza vaccine: Not documented    Tetanus booster: 03/09/2009: Td    Pneumococcal vaccine: Not documented  Other Screening   Pap smear: Normal  (09/24/2001)   Pap smear action/deferral: Not indicated S/P hysterectomy  (11/06/2009)    Mammogram: Not documented   Mammogram action/deferral: Ordered  (03/09/2009)   Smoking status: current  (07/22/2010)   Smoking cessation counseling: yes  (07/22/2010)  Lipids   Total Cholesterol: 333  (11/06/2009)   Lipid panel action/deferral: Lipid Panel ordered   LDL: 266  (11/06/2009)   LDL Direct: Not documented   HDL: 42  (11/06/2009)   Triglycerides: 123  (11/06/2009)    SGOT (AST): 10  (11/06/2009)   BMP action: Ordered   SGPT (ALT): 9  (11/06/2009) CMP ordered    Alkaline phosphatase: 56  (11/06/2009)   Total bilirubin: 0.4  (11/06/2009)    Lipid flowsheet reviewed?: Yes   Progress toward LDL goal: Unchanged  Hypertension   Last Blood Pressure: 129 / 77  (07/22/2010)   Serum creatinine: 0.69  (11/06/2009)   Serum potassium 4.3  (11/06/2009) CMP ordered     Hypertension flowsheet reviewed?: Yes   Progress toward BP goal: At goal  Self-Management Support :   Personal Goals (by the next clinic visit) :      Personal blood pressure goal: 140/90  (11/06/2009)     Personal LDL goal: 100  (11/06/2009)    Patient will work on the following items until the next clinic visit to reach self-care goals:     Medications and monitoring: take my medicines every day, bring all of my medications to every visit  (07/22/2010)     Eating: eat more vegetables, use fresh or frozen vegetables, eat fruit for snacks and desserts  (07/22/2010)     Activity: take a 30 minute walk every day  (07/22/2010)    Hypertension self-management support: Written self-care plan, Education handout, Resources for patients handout  (07/22/2010)   Hypertension self-care  plan printed.   Hypertension education handout printed    Lipid self-management support: Written self-care plan, Education handout, Resources for patients handout  (07/22/2010)   Lipid self-care plan printed.   Lipid education handout printed      Resource handout printed.  Appended Document: Est-ck/fu/meds/cfb Lipid panel demonstrated significantly elevated total and LDL cholesterol (292 and 220, respectively). Called patient and informed her of results. Changed from pravastatin to higher potency statin. Selected simvastatin because it is generic and should be more affordable for patient, who does not have insurance, than other high potency statins. Will continue to follow hyperlipidemia at subsequent visits.   Prescriptions: SIMVASTATIN 40 MG TABS (SIMVASTATIN) Take 1 tablet by mouth once a day  #30 x 6   Entered and Authorized by:   Whitney Post MD   Signed by:   Whitney Post MD on 07/30/2010   Method used:   Electronically to        [facility] [phone]* (retail)       [address]       [place], [place]  [address]       Ph: [phone]  Fax: [phone]   RxID:   [id number]

## 2010-08-15 NOTE — Progress Notes (Signed)
Summary: vaginal yeast/ hla  Phone Note Call from Patient   Summary of Call: pt calls and states since she has taken the metronidazole she has developed a vaginal yeast, could you please send a script to the pharm? Initial call taken by: Marin Roberts RN,  Nov 20, 2009 10:12 AM  Follow-up for Phone Call        Pt. has had recurrent bacterial vaginosis and yeast infections repeatedly treated. We can treat this time. But if this keep happenning, Dr. Clent Ridges might want to consider sending her to gyn.  Will Treat with diflucan 150mg  times one which may be repeated in 3 days if she still has symptoms. Follow-up by: Zoila Shutter MD,  Nov 21, 2009 10:36 AM  Additional Follow-up for Phone Call Additional follow up Details #1::        Pt informed med is ready at pharmacy Additional Follow-up by: Merrie Roof RN,  Nov 21, 2009 10:53 AM    New/Updated Medications: FLUCONAZOLE 150 MG TABS (FLUCONAZOLE) take one by mouth, may repeat in 3 days if still having symptoms Prescriptions: FLUCONAZOLE 150 MG TABS (FLUCONAZOLE) take one by mouth, may repeat in 3 days if still having symptoms  #2 x 1   Entered by:   Zoila Shutter MD   Authorized by:   Elby Showers MD   Signed by:   Zoila Shutter MD on 11/21/2009   Method used:   Electronically to        [facility] [phone]* (retail)       [address]       [place], [place]  [address]       Ph: [phone]       Fax: [phone]   RxID:   [id number]

## 2010-08-15 NOTE — Progress Notes (Signed)
----   Converted from flag ---- ---- 11/09/2009 11:38 AM, Chinita Pester RN wrote: Pt. was called and made awared of bact.vaginosis and new rxs for Metronidazole and Pravachol per Dr. Clent Ridges.  ---- 11/09/2009 9:59 AM, Elby Showers MD wrote: Please let Ms. Shaver know that she has bacterial vaginosis and I have written for a 7 day prescription of metronidazole.  Also her cholesterol is high, and given that she also smokes and has high blood pressure we will need to put her on a medication to bring it down.  Prescription for pravachol written. thank you, Santina Evans ------------------------------

## 2010-08-15 NOTE — Progress Notes (Signed)
Summary: Refill/gh  Phone Note Refill Request Message from:  Fax from Pharmacy on March 15, 2010 2:28 PM  Refills Requested: Medication #1:  PRAVACHOL 40 MG TABS Take one tablet daily to reduce cholesterol.   Last Refilled: 02/20/2010  Method Requested: Electronic Initial call taken by: Angelina Ok RN,  March 15, 2010 2:28 PM  Follow-up for Phone Call        Last FLP horrible. Was to F/U 3 months after March appt. Sent flag to sch appt. Follow-up by: Blanch Media MD,  March 15, 2010 3:35 PM    Prescriptions: PRAVACHOL 40 MG TABS (PRAVASTATIN SODIUM) Take one tablet daily to reduce cholesterol.  #30 x 3   Entered by:   Blanch Media MD   Authorized by:   Marland Kitchen [facility] ATTENDING DESKTOP   Signed by:   Blanch Media MD on 03/15/2010   Method used:   Faxed to ...       [facility] [address] [phone]* (retail)       [address]       [place], [place]  [address]       Ph: [phone]       Fax: [phone]   RxID:   [id number]

## 2010-08-15 NOTE — Progress Notes (Signed)
Summary: Refill/gh  Phone Note Refill Request Message from:  Patient on June 13, 2010 11:01 AM  Refills Requested: Medication #1:  LISINOPRIL 10 MG TABS Take 1 tablet by mouth once a day  Medication #2:  PRAVACHOL 40 MG TABS Take one tablet daily to reduce cholesterol. Last office visit and labs were 10/2009.   Method Requested: Electronic Initial call taken by: Angelina Ok RN,  June 13, 2010 11:01 AM  Follow-up for Phone Call        Last appt April. Cancelled Nov appt. Will give 1 month and pt must keep appt to get anymore med refills. I sent a flag to Ms Lissa Hoard to schedule an appt.  Follow-up by: Blanch Media MD,  June 13, 2010 11:15 AM    Prescriptions: PRAVACHOL 40 MG TABS (PRAVASTATIN SODIUM) Take one tablet daily to reduce cholesterol.  #30 x 0   Entered and Authorized by:   Blanch Media MD   Signed by:   Blanch Media MD on 06/13/2010   Method used:   Electronically to        [facility] [phone]* (retail)       [address]       [place], [place]  [address]       Ph: [phone]       Fax: [phone]   RxID:   [id number] LISINOPRIL 10 MG TABS (LISINOPRIL) Take 1 tablet by mouth once a day  #30 x 0   Entered and Authorized by:   Blanch Media MD   Signed by:   Blanch Media MD on 06/13/2010   Method used:   Electronically to        [facility] [phone]* (retail)       [address]       [place], [place]  [address]       Ph: [phone]       Fax: [phone]   RxID:   [id number]

## 2010-08-21 NOTE — Progress Notes (Addendum)
Summary: BV  Phone Note Call from Patient   Caller: Patient Call For: Whitney Post MD Summary of Call: Call from pt said that she has a Bacterial Infection.  Said that the Flagyl does not work. Has white discharge.  Just started has a fishy odor has had this before.  Has always been given Flagyl that does not work.  Wants to know what she should do.  Can she get something else.  Partner is never treated.  Does use the condoms.  Pt doesnot have intercourse during the treatment phase.  Uses the [facility] on [address]. Initial call taken by: Angelina Ok RN,  August 13, 2010 12:17 PM  Follow-up for Phone Call        Will try cleocin- she needs evaluatiion for this if this does not work. Prior wet prep showed BV. Follow-up by: Julaine Fusi  DO,  August 13, 2010 12:44 PM  Additional Follow-up for Phone Call Additional follow up Details #1::        Pt called and informed that prescription has been sent to the pharmacy.  Pt to call if no relief.Angelina Ok RN  August 13, 2010 2:52 PM  Additional Follow-up by: Angelina Ok RN,  August 13, 2010 2:52 PM    New/Updated Medications: CLEOCIN 300 MG CAPS (CLINDAMYCIN HCL) Take 1 tablet by mouth two times a day Prescriptions: CLEOCIN 300 MG CAPS (CLINDAMYCIN HCL) Take 1 tablet by mouth two times a day  #14 x 0   Entered and Authorized by:   Julaine Fusi  DO   Signed by:   Julaine Fusi  DO on 08/13/2010   Method used:   Electronically to        [facility] [phone]* (retail)       [address]       [place], [place]  [address]       Ph: [phone]       Fax: [phone]   RxID:   [id number]

## 2010-09-23 LAB — POCT URINALYSIS DIPSTICK
Bilirubin Urine: NEGATIVE
Glucose, UA: NEGATIVE mg/dL
Hgb urine dipstick: NEGATIVE
Ketones, ur: NEGATIVE mg/dL
Nitrite: NEGATIVE
Protein, ur: NEGATIVE mg/dL
Specific Gravity, Urine: 1.01 (ref 1.005–1.030)
Urobilinogen, UA: 0.2 mg/dL (ref 0.0–1.0)
pH: 7 (ref 5.0–8.0)

## 2010-09-23 LAB — WET PREP, GENITAL
Trich, Wet Prep: NONE SEEN
Yeast Wet Prep HPF POC: NONE SEEN

## 2010-09-23 LAB — GC/CHLAMYDIA PROBE AMP, GENITAL
Chlamydia, DNA Probe: NEGATIVE
GC Probe Amp, Genital: NEGATIVE

## 2010-09-23 LAB — POCT PREGNANCY, URINE: Preg Test, Ur: NEGATIVE

## 2010-09-25 LAB — POCT PREGNANCY, URINE: Preg Test, Ur: NEGATIVE

## 2010-09-25 LAB — GC/CHLAMYDIA PROBE AMP, GENITAL
Chlamydia, DNA Probe: NEGATIVE
GC Probe Amp, Genital: NEGATIVE

## 2010-09-25 LAB — WET PREP, GENITAL
Trich, Wet Prep: NONE SEEN
Yeast Wet Prep HPF POC: NONE SEEN

## 2010-09-29 LAB — GC/CHLAMYDIA PROBE AMP, GENITAL: Chlamydia, DNA Probe: NEGATIVE

## 2010-09-29 LAB — WET PREP, GENITAL
Trich, Wet Prep: NONE SEEN
WBC, Wet Prep HPF POC: NONE SEEN
Yeast Wet Prep HPF POC: NONE SEEN

## 2010-10-02 LAB — WET PREP, GENITAL: Yeast Wet Prep HPF POC: NONE SEEN

## 2010-10-02 LAB — GC/CHLAMYDIA PROBE AMP, GENITAL: GC Probe Amp, Genital: NEGATIVE

## 2010-10-04 LAB — WET PREP, GENITAL
Clue Cells Wet Prep HPF POC: NONE SEEN
Trich, Wet Prep: NONE SEEN

## 2010-11-29 NOTE — Op Note (Signed)
. [facility]  Patient:    Ann Stone, Ann Stone Visit Number: [id number] MRN: [id number]          Service Type: MED Location: MICU [id number] Attending Physician:  Willow Ora Dictated by:   Shearon Balo, M.D. Proc. Date: 10/11/01 Admit Date:  10/10/2001   CC:         Conni Elliot, M.D.   Operative Report  PREOPERATIVE DIAGNOSIS:  A 45 year old black female with uterine hemorrhage secondary to leiomyomata with severe anemia.  POSTOPERATIVE DIAGNOSIS:  A 45 year old black female with uterine hemorrhage secondary to leiomyomata with severe anemia.  PROCEDURE:  Total abdominal hysterectomy.  SURGEON:  Shearon Balo, M.D.  ASSISTANT:  Conni Elliot, M.D.  ANESTHESIA:  General endotracheal.  COMPLICATIONS:  None.  ESTIMATED BLOOD LOSS:  20 cc.  FINDINGS:  Dense adhesions of the anterior uterus to the anterior abdominal wall, normal ovaries and tubes bilaterally, enlarged uterus, multiple leiomyomata.  DISPOSITION:  Intensive care unit in critical condition.  INDICATIONS FOR PROCEDURE:  The patient is a 45 year old black female who is well known to me through the [facility], who presented to the hospital on October 10, 2001, with onset of vaginal bleeding earlier in that day.  She was admitted to [facility] after being transferred from [facility] to the emergency department secondary to tachycardia.  She was admitted to the [facility], and overnight continued to have heavy vaginal bleeding despite treatment with IV Premarin.  The patients hemoglobin dropped from initial of 11.2 to 6.9 to 5.2 the following morning. The patient is a Air traffic controller Witness, and during my previous interactions with the patient has declined blood transfusions.  The issue of blood transfusion was discussed with the patient again early in the morning of surgery, however, the patient and her family members declined blood transfusion  for her.  The patient was evaluated by me on the morning of surgery, and consult with interventional radiology was performed for emergency embolization of her uterine arteries.  The decision was made to proceed with a technique to minimize any blood loss that may be associated with surgery.  The patient was taken for uterine artery embolization, but this procedure was unsuccessful and emergency hysterectomy was arranged.  DESCRIPTION OF PROCEDURE:  The patient was taken to the operating room where she was placed under general endotracheal anesthesia.  She was prepped and draped in a sterile fashion.  A midline vertical incision was performed and carried down to the fascia with the Bovie.  The fascia was entered sharply and dissected superiorly and inferiorly.  The midline was identified, and the underlying ______ and sharply with Metzenbaum scissors.  There were dense adhesions to the anterior uterus, to the anterior abdominal wall which required extensive dissection.  This was performed with both Bovie and Metzenbaum scissors.  Once the anterior abdominal wall was deflected away from the uterus, the pelvis was relatively free of adhesions to this point.  The patients right round ligament was identified and ligated with a 0 Vicryl suture.  This was transected with a Bovie, and the round ligament was ______ along the infundibulopelvic vessels.  The right uterine ovarian pedicle was isolated and clamped with a Heaney clamp.  A second clamp was placed proximal to the uterus.  This was transected and doubly ligated with 0 Vicryl sutures. The patients left round ligament was identified in a similar fashion and ligated.  Skin transected with the Bovie, and the  round ligament was dissected along the infundibulopelvic vessels.  The utero-ovarian pedicle was again identified and clamped with a Heaney clamp.  A second clamp was placed proximal to the uterus.  This was transected and ligated with 0  Vicryl sutures.  A bladder flap was created by dissecting the peritoneum across the anterior surface of the uterus.  The uterine vessels were skeletonized and clamped with Heaney clamps.  These were transected and ligated with 0 Vicryl sutures.  Alternating bites were taken with Heaney clamps along the remaining corpus and cervix.  These were all transected and ligated with 0 Vicryl sutures.  Heaney sutures were placed at the corners of the vaginal cuff at the level of the uterosacral ligaments.  Again, these were ligated with 0 Vicryl sutures.  The vagina was entered, and the vaginal cuff was dissected away from the cervix.  The uterus was handed off for pathology.  The remaining vaginal cuff was closed with interrupted sutures of 0 Vicryl.  The pelvis was irrigated with copious amounts of saline.  All pedicles were inspected and noted to be hemostatic.  The bowel retractor was removed from the abdomen. The fascia was closed with a looped 0 PDS in a modified Smead-Jones fashion. The subcutaneous tissues were irrigated with copious amounts of saline, and the subcutaneous tissue was reapproximated with remaining 0 Vicryl sutures. Staples were used to close the skin edges.  The patient tolerated the procedure well.  Intraoperative hemoglobin was 2.3.  The intravenous fluids during the case were 500 cc of Hespan, 1000 cc of normal saline, and 2400 cc of lactated ringers.  The patient was taken directly to the intensive care unit where she remained intubated and in critical condition.  Sponge, needle, and instrument counts were correct at the end of the procedure. Dictated by:   Shearon Balo, M.D. Attending Physician:  Willow Ora DD:  10/12/01 TD:  10/13/01 Job: [id number]

## 2010-11-29 NOTE — Discharge Summary (Signed)
[facility]  Patient:    Ann Stone, Ann Stone                       MRN: [id number] Adm. Date:  [id number] Disc. Date: 06/12/00 Attending:  Tammi Sou Dictator:   Zella Ball, M.D.                           Discharge Summary  DATE OF BIRTH:                May 28, 1966.  DISCHARGE DIAGNOSES:          1. Uterine fibroids, with history of myomectomy                                  for fibroids in 2000.                               2. Severe anemia secondary to uterine bleeding.                               3. Hypertension.  DISCHARGE MEDICATIONS:        1. Provera 10 mg one pill p.o. q.d.                               2. Premarin 2.5 mg one pill t.i.d. x 3 days,                                  then one pill b.i.d.                               3. FeSO4 325 mg one tab p.o. t.i.d.                               4. Colace 100 mg one pill q.d. or b.i.d. p.r.n.                                  constipation.  PRESENTING HISTORY:           This is a 45 year old G1, P0 who has been seen at the [facility] previously for uterine fibroids, with fibroid removal in October of 2000, who had re-presented to the GYN clinic with heavy periods times the past several months.  She was seen in the GYN clinic one week before presentation to [facility] with a complaint of heavy menstrual bleeding and at that time, was started on oral Provera, which patient said at the time decreased her menstrual bleeding, but when she was finished with her Provera prescription, she began to bleed quite heavily vaginally once again.  Patients only significant medical history is a history of hypertension and the aforementioned myomectomy for fibroids.  MEDICATION ON PRESENTATION:   Hydrochlorothiazide.  ALLERGIES:                    No known drug allergies.  PHYSICAL EXAMINATION ON PRESENTATION:  Patient had a temperature of 99.0, respiratory rate of 20 and  pulse of 106 and blood pressure of 131/61. GENERAL:  Patient was alert and in no acute distress.  HEENT:  Sclerae were clear.  Tongue and mucous membranes were pale; however, moist.  LUNGS:  Clear bilaterally.  CARDIOVASCULAR:  Tachycardic to the 130s but regular rhythm. ABDOMEN:  Positive bowel sounds.  Nontender.  No guarding.  PELVIC:  Vaginal exam showed normal external female genitalia.  Vaginal vault had moderate bleeding with some large clots.  Cervix was grossly within normal limits.  PRESENTING LABORATORY DATA:   Patient had a hemoglobin of 4.1 on presentation, a hematocrit of 14.4.  She had a wet prep which was done which was negative, GC and Chlamydia probes were negative and beta hCG was negative.  ASSESSMENT:                   Assessment at that time was a patient with very profound anemia secondary to menorrhagia, secondary to likely a recurrence of uterine fibroids; therefore, patient was admitted for medical control of her uterine bleeding.  HOSPITAL COURSE:              Patient was admitted and placed on IV Premarin 25 mg q.6h. and with marked decrease in her uterine bleeding.  After two days of being on the IV Premarin, patient was switched to an oral Provera and Premarin regimen as well as had iron added to her medication regimen.  Patient seemed to respond quite well and on date of discharge, patient was having very mild uterine bleeding.  Due to the fact that the patient was a [employer], she was unable or unwilling to accept blood products and therefore, transfusion could not be performed on this patient.  Secondary to this also, power of attorney and advanced directives were gone over with the patient and it was explained to the patient should she have another profound episode of bleeding, if she were to come into the hospital obtunded or otherwise unable to make decisions for herself, likely she would receive blood products, therefore, it would be prudent  before she leaves the hospital to establish her advanced directive that this indeed would be against her wishes.  These papers were given to the patient and I believe she was to give them to social work prior to her discharge from the hospital.  DISPOSITION:                  Patient was discharged in stable condition.  FOLLOWUP:                     Patient was to follow up on Tuesday, December 4th, at the GYN clinic with Dr. Roseanna Rainbow.  SPECIAL INSTRUCTIONS:         Patient was instructed to greatly limit her activity level this weekend and to be sure to take all of her medications and the risks of another severe bleed were gone over in depth with the patient. DD:  06/12/00 TD:  06/12/00 Job: [id number]

## 2010-11-29 NOTE — H&P (Signed)
[facility]  Patient:    Ann Stone, Ann Stone Visit Number: [id number] MRN: [id number]          Service Type: MED Location: [phone] Attending Physician:  Willow Ora Dictated by:   Vear Clock, M.D. Admit Date:  10/10/2001                           History and Physical  PRIMARY MEDICAL DOCTOR:  Patient is unassigned.  GYNECOLOGIST:  Sees Dr. Shearon Balo for GYN issues.  CHIEF COMPLAINT:  Seizures.  HISTORY OF PRESENT ILLNESS:  [age]-year-old African American female who presents with new-onset grand mal seizure.  Patient presented to [facility] this morning due to increased vaginal bleeding.  She has known and significant history of dysfunctional uterine bleeding secondary to a submucosal leiomyoma.  She is currently planning to have a TAH after a trial of Lupron Depot, as she has failed trials with OCPs and Depo-Provera. At [facility] this morning, patient was found to be tachycardic and was sent to [facility] for evaluation.  She was stabilized here with normal sinus tachycardia and potassium repletion and set for discharge, when she became confused upon standing from a chair and subsequently had a generalized tonic-clonic seizure, which is her first ever.  Positive loss of bladder continence, positive postictal state.  We were called by EDP for admission.  Note:  Denies sick contacts.  Only new medications were eight ibuprofen yesterday.  PAST MEDICAL HISTORY: 1. Hypertension. 2. Submucosal leiomyoma. 3. Dysfunctional uterine bleeding secondary to #2, status post fibroidectomy    in 1999 with worsening dysfunctional uterine bleeding six months    thereafter.  MEDICATIONS: 1. Hydrochlorothiazide 25 mg p.o. q.d. 2. Ferrous sulfate 325 mg q.i.d. 3. Note:  Patient previously on OCPs per day.  When this failed, patient was    changed to two shots of Depo-Provera q.2wks.  She improved and  this and    graduated to one shot of Depo-Provera on June 26, 2001 and    September 24, 2001.  ALLERGIES:  No known drug allergies, however, patient will not receive blood products secondary to religious beliefs.  REVIEW OF SYSTEMS:  Positive for heat intolerance, tachycardia, decreased p.o., slight diarrhea and constipation.  Negative for bloody stools, headache, fatigue, weight changes, shortness of breath, fever and chills, nausea, vomiting, neck muscle or joint pains or dysuria.  SOCIAL HISTORY:  Patient lives with her parents, has a high school education. No children but cares for three family members children during the day. Positive tobacco, one-half pack per day.  Rare alcohol.  Patient is a Curator.  No blood products.  FAMILY HISTORY:  Mother is alive with lupus, diabetes, fibroids and status post thyromegaly.  Father is alive with hypertension and diabetes.  Brothers and sisters healthy.  No family history of breast or uterine cancer.  Several aunts with fibroids, status post TAHs.  PHYSICAL EXAMINATION:  VITAL SIGNS:  T 99.4, pulse 121 to 141, blood pressure 124/60, respirations 22, saturating 99% on room air.  GENERAL:  Patient talkative, in no acute distress, lying in bed.  HEENT:  Normal fundi.  TMs clear.  OP without erythema.  PERRLA. Normocephalic/atraumatic.  Positive conjunctival pallor.  NECK:  Supple.  No lymphadenopathy.  Questionable right versus greater than left thyromegaly without nodules.  CARDIOVASCULAR:  Regular rhythm with tachycardia.  No murmurs, rubs, or gallops.  LUNGS:  Clear to auscultation bilaterally.  ABDOMEN:  Fundus at the level of the umbilicus, nontender.  EXTREMITIES:  No clubbing, cyanosis, edema or calf tenderness.  NEUROLOGIC:  Cranial nerves grossly intact.  Good strength to any range of motion.  DTRs 1+ bilaterally.  No focal deficits.  LABORATORY AND ACCESSORY DATA:  CK 216, MB 1.3, index 0.6 and troponin  0.01. TSH 1.560.  WBC 10.8, 53% neutrophils, 41% lymphocytes, hemoglobin 9.4, which is decreased from approximately 11 at [facility] this morning, platelets 238,000.  Head CT normal.  ASSESSMENT:  [age]-year-old African American with new-onset seizure, increased heart rate and dysfunctional uterine bleeding.  PLAN: 1. DUB:  Premarin 25 mg IV x1, may repeat in 6 to 12 hours p.r.n.  This should    stop bleeding immediately; if so, can consult Dr. Orlene Erm in a.m. for further    plan.  Questionable need for hysterectomy or Lupron at this time. 2. 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  Patient will not accept blood transfusion, which is very    concerning.  Will readdress if absolutely necessary but will abide by    patients religious beliefs, according to her wishes.  As patient had    significant decrease in hemoglobin from [facility],    will recheck CBC now and proceed accordingly.  Consider serial CBCs    overnight, although this would further deplete her already diminished blood    supply. 3. Tachycardia:  Patient states she has baseline increased heart rate but    records are not currently available.  We will monitor her in step-down unit    overnight.  Could be secondary to acute blood loss versus baseline versus    questionable other etiology.  If febrile overnight, will check blood    cultures x2. 4. Seizure:  Very unusual for seizure to be secondary to rapid/acute blood    loss but we have no other explanation at this time.  We will check    electrolytes.  Head CT normal.  Consider neurology consult in a.m.,    especially with repeat seizure activity. 5. Hypertension:  Patient not currently with elevated BP and at high risk for    decreased blood pressure with ongoing bleeding.  Will hold medications for    now. Dictated by:   Vear Clock, M.D. Attending Physician:  Willow Ora DD:   10/10/01 TD:  10/11/01 Job: [id number]

## 2010-11-29 NOTE — Discharge Summary (Signed)
Ann Stone, Ann Stone                            ACCOUNT NO.:  [id number]   MEDICAL RECORD NO.:  [id number]                  PATIENT TYPE:   LOCATION:                                       FACILITY:   PHYSICIAN:  Jeoffrey Massed, M.D.             DATE OF BIRTH:   DATE OF ADMISSION:  10/10/2001  DATE OF DISCHARGE:  10/25/2001                                 DISCHARGE SUMMARY   ADMISSION DIAGNOSES:  1. Dysfunctional uterine bleeding.  2. Anemia.  3. Tachycardia.  4. Seizure.  5. Known Jehovah's Witness.   DISCHARGE DIAGNOSES:  1. Dysfunctional uterine bleeding.  2. Anemia.  3. Tachycardia.  4. Seizure.  5. Known Jehovah's Witness.  6. Status post total abdominal hysterectomy.   DISCHARGE MEDICATIONS:  1. Hydrochlorothiazide 25 mg one tab daily to be started after she follows     up with her primary M.D.  2. Iron sulfate 325 mg one tab daily.  3. Folate 1 mg one tab daily for three months.   CONSULTANTS:  1. OB/GYN, Dr. Enid Cutter, was consulted on admission regarding     dysfunctional uterine bleeding.  Please see hospital course for further     information on this.  2. Critical care medicine was consulted on October 11, 2001 for ventilator     management and critical care issues, status post total abdominal     hysterectomy.  3. Interventional radiology on October 11, 2001.  Interventional radiology was     consulted for attempted uterine artery embolization for dysfunctional     uterine bleeding.  Unfortunately, this procedure was unsuccessful.   PROCEDURES:  1. On October 11, 2001, uterine artery embolization was attempted by     interventional radiology, however, this was ineffective and the patient     had to be taken for emergent hysterectomy.  2. Emergent total abdominal hysterectomy performed on October 11, 2001 by Dr.     Orlene Erm and Dr. Conni Elliot.   HISTORY OF PRESENT ILLNESS:  For complete H&P, please see resident H&P in  chart.  Briefly, this was a  45 year old African American female who had a  known history of dysfunctional uterine bleeding secondary to submucosal  leiomyomata.  She was known to Dr. Orlene Erm in OB/GYN and in the past, she had  maintained that her religious beliefs (Jehovah's Witness) precluded her from  receiving any blood transfusions.   The patient presented to [facility] on October 10, 2001  with vaginal bleeding that had begun that day.  She was given some IV fluids  and remained persistently tachycardic, so was transferred to the [facility] for further evaluation.  At [facility] Emergency Department, she  was noted to have continued vaginal bleeding and a resting tachycardia in  the range of 120 to 140.  Blood pressure remained in normal to low-normal  range.  The patient was  initially alert and talkative and in no distress.  Neurologic exam revealed no deficits and laboratory work did reveal a  hemoglobin level of 9.4, which was down from a hemoglobin of 11 earlier in  the day at [facility].  Again, she refused transfusion and further  stabilization was continued with normal saline infusion.  While in the  emergency department, she proceeded to have a generalized tonic-clonic  seizure which was presumed to be secondary to rapid acute blood loss.  She  was admitted to the hospital after this and hospital course is as follows by  problem:   HOSPITAL COURSE:  1. DYSFUNCTIONAL UTERINE BLEEDING:  Initially, an attempt to control     bleeding was made with Premarin IV.  Dr. Orlene Erm of OB/GYN was consulted     and he recommended continuation of normal saline as well as the Premarin     and he came and evaluated the patient.  He consulted interventional     radiology for possible uterine artery embolization if she did not respond     to the Premarin.  Further discussions were carried out with the patient     regarding the need for blood transfusion and she continued to decline     them  and her family was also adamant about declining these.  The patient     continued to have vaginal bleeding and remained tachycardic.  Hemoglobin     dropped to 9.4 and then to 6.9.  Interventional radiology attempted to     perform uterine artery embolization but this was unsuccessful.     Hemoglobin was then 5.2.  It was determined that the last chance to stop     her bleeding would be emergent hysterectomy, and this was carried out on     October 11, 2001 by Dr. Orlene Erm and Dr. Gavin Potters.  Intraoperative hemoglobin     was 2.3.  Please see dictation of operative note for findings.  The     patient did survive the procedure and was transferred in critical     condition to the ICU on the ventilator.  Pulmonary/critical care medicine     was consulted and assisted with ventilator management as well as     management of her anemia.  Postoperatively, she continued on the     ventilator while critically ill in order to decrease metabolic demands.     She remained with a compensatory tachycardia secondary to her profound     anemia.  She and her family continued to refuse transfusions.  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  She showed no signs of multiorgan dysfunction.   1. ANEMIA:  As stated before in the initial H&P, her hemoglobin did continue     to drop and reached a low on the third day after surgery at 2.2.  She was     begun on aggressive erythropoietin and IV iron therapy.  Her hemoglobin     did begin to rise after reaching this low of 2.2 and was at 3.6 on October 18, 2001.  Her hemoglobin and hematocrit continued to gradually respond to     erythropoietin and InFeD therapy and she was slowly weaned off the InFeD     and put onto oral iron therapy after she was extubated.  At the     recommendations of critical care medicine, her Epogen  was continued up    until the time of discharge but was not continued as an  outpatient.     Hemoglobin level on the day prior to discharge was 6.6.  Of note, she did     remain with a slight tachycardia all the way up until the time of     discharge in the range of 110 to 115 without any signs of heart failure.   1. SEIZURE:  The patient did suffer that initial seizure in the [facility]     Emergency Department upon presentation but did not have any further     seizure activity.  No further testing was done to look into possible     etiologies for the seizure other than rapid acute blood loss and likely     cerebral ischemia.   1. HYPERTENSION:  This was a chronic diagnosis but of course with her     initial low-normal blood pressure and medical situation,     antihypertensives were held during the hospitalization and she was     instructed to follow up this problem with her primary M.D. after     discharge.  She had no hypertension during this hospitalization.    DISPOSITION:  On October 25, 2001, the patient was discharged to home in  stable and much improved condition on the previously mentioned discharge  medications.  She was instructed to follow up with Dr. Orlene Erm later in the  week.                                               Jeoffrey Massed, M.D.    PHM/MEDQ  D:  03/06/2002  T:  03/08/2002  Job:  [id number]   cc:   Enid Cutter, M.D.   Bradly Bienenstock, M.D.  [facility]  Family Practice Resident  [place] [address]  Fax: [phone]   Oley Balm. Sung Amabile, M.D. [facility]

## 2011-01-14 ENCOUNTER — Other Ambulatory Visit: Payer: Self-pay | Admitting: *Deleted

## 2011-01-14 MED ORDER — FLUCONAZOLE 150 MG PO TABS: ORAL_TABLET | ORAL | Status: DC

## 2011-01-14 NOTE — Telephone Encounter (Signed)
Pt last seen Jan 2012. Will ask that appt be sch.  Will give enough to take weekly for 2 months so that she has a chance to get in to be seen

## 2011-01-14 NOTE — Telephone Encounter (Signed)
Pt was called and informed that her prescription had been refilled and that she will need an appointment within the next 2 months.

## 2011-01-14 NOTE — Telephone Encounter (Signed)
Pt said that she was told by her previous doctor that she is prone to chronic yeast infections and should continue to be on the medication to prevent flar ups.  Pt said that she is out of refills at this time.

## 2011-02-19 ENCOUNTER — Encounter: Payer: Self-pay | Admitting: Internal Medicine

## 2011-03-06 ENCOUNTER — Encounter: Payer: Self-pay | Admitting: Internal Medicine

## 2011-03-13 ENCOUNTER — Encounter: Payer: Self-pay | Admitting: Internal Medicine

## 2011-03-13 ENCOUNTER — Ambulatory Visit (INDEPENDENT_AMBULATORY_CARE_PROVIDER_SITE_OTHER): Payer: Self-pay | Admitting: Internal Medicine

## 2011-03-13 VITALS — BP 119/77 | HR 80 | Temp 98.4°F | Ht 65.0 in | Wt 166.8 lb

## 2011-03-13 DIAGNOSIS — E785 Hyperlipidemia, unspecified: Secondary | ICD-10-CM

## 2011-03-13 DIAGNOSIS — N760 Acute vaginitis: Secondary | ICD-10-CM

## 2011-03-13 DIAGNOSIS — I10 Essential (primary) hypertension: Secondary | ICD-10-CM

## 2011-03-13 DIAGNOSIS — N898 Other specified noninflammatory disorders of vagina: Secondary | ICD-10-CM

## 2011-03-13 DIAGNOSIS — F329 Major depressive disorder, single episode, unspecified: Secondary | ICD-10-CM

## 2011-03-13 DIAGNOSIS — F17200 Nicotine dependence, unspecified, uncomplicated: Secondary | ICD-10-CM

## 2011-03-13 MED ORDER — METRONIDAZOLE 0.75 % VA GEL: VAGINAL | Status: DC

## 2011-03-13 MED ORDER — SIMVASTATIN 40 MG PO TABS: 40.0000 mg | ORAL_TABLET | Freq: Every day | ORAL | Status: DC

## 2011-03-13 MED ORDER — HYDROCHLOROTHIAZIDE 25 MG PO TABS: 25.0000 mg | ORAL_TABLET | Freq: Every day | ORAL | Status: DC

## 2011-03-13 MED ORDER — LISINOPRIL 10 MG PO TABS: 10.0000 mg | ORAL_TABLET | Freq: Every day | ORAL | Status: DC

## 2011-03-13 MED ORDER — METRONIDAZOLE 500 MG PO TABS: 500.0000 mg | ORAL_TABLET | Freq: Two times a day (BID) | ORAL | Status: AC

## 2011-03-13 MED ORDER — FLUCONAZOLE 150 MG PO TABS: ORAL_TABLET | ORAL | Status: DC

## 2011-03-13 NOTE — Assessment & Plan Note (Signed)
Patient on hydrochlorothiazide 25 mg and lisinopril 10 mg. Blood pressure today 119/77. No change to medication at today's visit. We'll recheck in 6 months.

## 2011-03-13 NOTE — Assessment & Plan Note (Signed)
See progress note.

## 2011-03-13 NOTE — Patient Instructions (Addendum)
You were seen today for vaginal discharge. We are going to give you flagyl (metronidazole) to be taken twice a day for 7 days. We will also give you some of that medicine in gel form that you can use as if it were a lubricant before having sex to avoid future infections. If your symptoms worsen or do not improve in 5 days please feel free to call us back. Please come back some morning before you have eaten to get a fasting cholesterol check. We will see you back in 6 months. If you are having problems or feel you need to be seen sooner please call our office. Our number is [phone]. Please strongly consider quitting smoking!This is one of the best things you can do for your body!  Smoking Cessation This document explains the best ways for you to quit smoking and new treatments to help. It lists new medicines that can double or triple your chances of quitting and quitting for good. It also considers ways to avoid relapses and concerns you may have about quitting, including weight gain. NICOTINE: A POWERFUL ADDICTION If you have tried to quit smoking, you know how hard it can be. It is hard because nicotine is a very addictive drug. For some people, it can be as addictive as heroin or cocaine. Usually, people make 2 or 3 tries, or more, before finally being able to quit. Each time you try to quit, you can learn about what helps and what hurts. Quitting takes hard work and a lot of effort, but you can quit smoking. QUITTING SMOKING IS ONE OF THE MOST IMPORTANT THINGS YOU WILL EVER DO:  You will live longer, feel better, and live better.   The impact on your body of quitting smoking is felt almost immediately:   Within 20 minutes, blood pressure decreases. Pulse returns to its normal level.   After 8 hours, carbon monoxide levels in the blood return to normal. Oxygen level increases.   After 24 hours, chance of heart attack starts to decrease. Breath, hair, and body stop smelling like smoke.    After 48 hours, damaged nerve endings begin to recover. Sense of taste and smell improve.   After 72 hours, the body is virtually free of nicotine. Bronchial tubes relax and breathing becomes easier.   After 2 to 12 weeks, lungs can hold more air. Exercise becomes easier and circulation improves.   Quitting will lower your chance of having a heart attack, stroke, cancer, or lung disease:   After 1 year, the risk of coronary heart disease is cut in half.   After 5 years, the risk of stroke falls to the same as a nonsmoker.   After 10 years, the risk of lung cancer is cut in half and the risk of other cancers decreases significantly.   After 15 years, the risk of coronary heart disease drops, usually to the level of a nonsmoker.   If you are pregnant, quitting smoking will improve your chances of having a healthy baby.   The people you live with, especially your children, will be healthier.   You will have extra money to spend on things other than cigarettes.  FIVE KEYS TO QUITTING Studies have shown that these 5 steps will help you quit smoking and quit for good. You have the best chances of quitting if you use them together: 1. Get ready.  2. Get support and encouragement.  3. Learn new skills and behaviors.  4. Get medicine to reduce  your nicotine addiction and use it correctly.  5. Be prepared for relapse or difficult situations. Be determined to continue trying to quit, even if you do not succeed at first.  1. GET READY  Set a quit date.   Change your environment.   Get rid of ALL cigarettes, ashtrays, matches, and lighters in your home, car, and place of work.   Do not let people smoke in your home.   Review your past attempts to quit. Think about what worked and what did not.   Once you quit, do not smoke. NOT EVEN A PUFF!  2. GET SUPPORT AND ENCOURAGEMENT Studies have shown that you have a better chance of being successful if you have help. You can get support in  many ways.  Tell your family, friends, and coworkers that you are going to quit and need their support. Ask them not to smoke around you.   Talk to your caregivers (doctor, dentist, nurse, pharmacist, psychologist, and/or smoking counselor).   Get individual, group, or telephone counseling and support. The more counseling you have, the better your chances are of quitting. Programs are available at [employer] and health centers. Call your local health department for information about programs in your area.   Spiritual beliefs and practices may help some smokers quit.   Quit meters are Photographer that keep track of quit statistics, such as amount of "quit-time," cigarettes not smoked, and money saved.   Many smokers find one or more of the many self-help books available useful in helping them quit and stay off tobacco.  3. LEARN NEW SKILLS AND BEHAVIORS  Try to distract yourself from urges to smoke. Talk to someone, go for a walk, or occupy your time with a task.   When you first try to quit, change your routine. Take a different route to work. Drink tea instead of coffee. Eat breakfast in a different place.   Do something to reduce your stress. Take a hot bath, exercise, or read a book.   Plan something enjoyable to do every day. Reward yourself for not smoking.   Explore interactive web-based programs that specialize in helping you quit.  4. GET MEDICINE AND USE IT CORRECTLY Medicines can help you stop smoking and decrease the urge to smoke. Combining medicine with the above behavioral methods and support can quadruple your chances of successfully quitting smoking. The [place]. Food and Drug Administration (FDA) has approved 7 medicines to help you quit smoking. These medicines fall into 3 categories.  Nicotine replacement therapy (delivers nicotine to your body without the negative effects and risks of smoking):   Nicotine gum: Available  over-the-counter.   Nicotine lozenges: Available over-the-counter.   Nicotine inhaler: Available by prescription.   Nicotine nasal spray: Available by prescription.   Nicotine skin patches (transdermal): Available by prescription and over-the-counter.   Antidepressant medicine (helps people abstain from smoking, but how this works is unknown):   Bupropion sustained-release (SR) tablets: Available by prescription.   Nicotinic receptor partial agonist (simulates the effect of nicotine in your brain):   Varenicline tartrate tablets: Available by prescription.   Ask your caregiver for advice about which medicines to use and how to use them. Carefully read the information on the package.   Everyone who is trying to quit may benefit from using a medicine. If you are pregnant or trying to become pregnant, nursing an infant, you are under age 25, or you smoke fewer than 10 cigarettes per  day, talk to your caregiver before taking any nicotine replacement medicines.   You should stop using a nicotine replacement product and call your caregiver if you experience nausea, dizziness, weakness, vomiting, fast or irregular heartbeat, mouth problems with the lozenge or gum, or redness or swelling of the skin around the patch that does not go away.   Do not use any other product containing nicotine while using a nicotine replacement product.   Talk to your caregiver before using these products if you have diabetes, heart disease, asthma, stomach ulcers, you had a recent heart attack, you have high blood pressure that is not controlled with medicine, a history of irregular heartbeat, or you have been prescribed medicine to help you quit smoking.  5. BE PREPARED FOR RELAPSE OR DIFFICULT SITUATIONS  Most relapses occur within the first 3 months after quitting. Do not be discouraged if you start smoking again. Remember, most people try several times before they finally quit.   You may have symptoms of  withdrawal because your body is used to nicotine. You may crave cigarettes, be irritable, feel very hungry, cough often, get headaches, or have difficulty concentrating.   The withdrawal symptoms are only temporary. They are strongest when you first quit, but they will go away within 10 to 14 days.  Here are some difficult situations to watch for:  Alcohol. Avoid drinking alcohol. Drinking lowers your chances of successfully quitting.   Caffeine. Try to reduce the amount of caffeine you consume. It also lowers your chances of successfully quitting.   Other smokers. Being around smoking can make you want to smoke. Avoid smokers.   Weight gain. Many smokers will gain weight when they quit, usually less than 10 pounds. Eat a healthy diet and stay active. Do not let weight gain distract you from your main goal, quitting smoking. Some medicines that help you quit smoking may also help delay weight gain. You can always lose the weight gained after you quit.   Bad mood or depression. There are a lot of ways to improve your mood other than smoking.  If you are having problems with any of these situations, talk to your caregiver. SPECIAL SITUATIONS OR CONDITIONS Studies suggest that everyone can quit smoking. Your situation or condition can give you a special reason to quit.  Pregnant women/New mothers: By quitting, you protect your baby's health and your own.   Hospitalized patients: By quitting, you reduce health problems and help healing.   Heart attack patients: By quitting, you reduce your risk of a second heart attack.   Lung, head, and neck cancer patients: By quitting, you reduce your chance of a second cancer.   Parents of children and adolescents: By quitting, you protect your children from illnesses caused by secondhand smoke.  QUESTIONS TO THINK ABOUT Think about the following questions before you try to stop smoking. You may want to talk about your answers with your caregiver.  Why  do you want to quit?   If you tried to quit in the past, what helped and what did not?   What will be the most difficult situations for you after you quit? How will you plan to handle them?   Who can help you through the tough times? Your family? Friends? Caregiver?   What pleasures do you get from smoking? What ways can you still get pleasure if you quit?  Here are some questions to ask your caregiver:  How can you help me to be successful at  quitting?   What medicine do you think would be best for me and how should I take it?   What should I do if I need more help?   What is smoking withdrawal like? How can I get information on withdrawal?  Quitting takes hard work and a lot of effort, but you can quit smoking. FOR MORE INFORMATION Smokefree.gov (http://www.davis-sullivan.com/) provides free, accurate, evidence-based information and professional assistance to help support the immediate and long-term needs of people trying to quit smoking. Document Released: 06/24/2001 Document Re-Released: 12/18/2009 [facility] Patient Information 2011 [place], [place].  Depression You have signs of depression. This is a common problem. It can occur at any age. It is often hard to recognize. People can suffer from depression and still have moments of enjoyment. Depression interferes with your basic ability to function in life. It upsets your relationships, sleep, eating, and work habits. CAUSES Depression is believed to be caused by an imbalance in brain chemicals. It may be triggered by an unpleasant event. Relationship crises, a death in the family, financial worries, retirement, or other stressors are normal causes of depression. Depression may also start for no known reason. Other factors that may play a part include medical illnesses, some medicines, genetics, and alcohol or drug abuse. SYMPTOMS  Feeling unhappy or worthless.   Long-lasting (chronic) tiredness or worn-out feeling.   Self-destructive  thoughts and actions.   Not being able to sleep or sleeping too much.   Eating more than usual or not eating at all.   Headaches or feeling anxious.   Trouble concentrating or making decisions.   Unexplained physical problems and substance abuse.  TREATMENT Depression usually gets better with treatment. This can include:  Antidepressant medicines. It can take weeks before the proper dose is achieved and benefits are reached.   Talking with a therapist, clergyperson, counselor, or friend. These people can help you gain insight into your problem and regain control of your life.   Eating a good diet.   Getting regular physical exercise, such as walking for 30 minutes every day.   Not abusing alcohol or drugs.  Treating depression often takes 6 months or longer. This length of treatment is needed to keep symptoms from returning. Call your caregiver and arrange for follow-up care as suggested. SEEK IMMEDIATE MEDICAL CARE IF:  You start to have thoughts of hurting yourself or others.   Call your local emergency services (911 in [place]   Go to your local medical emergency department.   Call the National Suicide Prevention Lifeline: 1-800-273-TALK (908)727-3450).  Document Released: 06/30/2005 Document Re-Released: 12/18/2009 [facility] Patient Information 2011 [place], [place].

## 2011-03-13 NOTE — Assessment & Plan Note (Signed)
Patiently currently smoking approximately one pack per day. She does not wish to quit at today's visit. I did counsel her to quit and give her information on quitting. She states she'll think about and we'll talk about it at future visit.

## 2011-03-13 NOTE — Assessment & Plan Note (Signed)
I did order a fasting lipid panel at today's visit. She will return to get this blood work drawn. She is currently taking simvastatin 40 mg daily. No change to medication at today's visit.

## 2011-03-13 NOTE — Progress Notes (Signed)
Subjective:    Patient ID: Ann Stone, female    DOB: [birth year], 45 y.o.   MRN: [id number]  HPI: The patient is a 45 year old female comes in today for a routine checkup visit who is also having vaginal discharge. She states that the discharge started 2-3 days ago and is creamy in color. She states that she has had prior yeast infections in the past however this does not seem typical for her and she is on a prophylactic dosing once weekly. She states that she is also had BV in the past and this is similar to this episode. She states that if she was not get this treated probably in about a week or so that discharge would be a lot increased, there be burning, and this malady much worse. She states that she has routinely been checked for HIV, gonorrhea, chlamydia. She has not changed sexual partners in the past 6 months. She has only one sexual partner. She has not had any fevers or chills at home. She is having no other symptoms. She has been taking her medications as prescribed. She states that she has been feeling occasional periods of decreased mood in the past 3 months. She states that they normally last just a couple days and felt close to what her PMS symptoms felt like back when she had periods. She said that she never feels suicidal, and that she never feels homicidal. She states that usually only lasts a day or so and then goes away. She is still smoking approximately one pack per day and does not wish to quit at this time.   Review of Systems  Constitutional: Negative.  Negative for fever, chills, activity change, appetite change, fatigue and unexpected weight change.  HENT: Negative.   Eyes: Negative.   Respiratory: Negative for cough and chest tightness.   Cardiovascular: Negative.  Negative for chest pain.  Gastrointestinal: Negative.  Negative for nausea, vomiting, abdominal pain, diarrhea, constipation and blood in stool.  Genitourinary: Positive for vaginal discharge. Negative for  dysuria, urgency, frequency, hematuria, flank pain, decreased urine volume, vaginal bleeding, enuresis, difficulty urinating, genital sores, vaginal pain, menstrual problem, pelvic pain and dyspareunia.       Patient states discharge is creamy and white in origin. States this is like a prior episode of BV that she had.  Musculoskeletal: Negative.   Skin: Negative.   Neurological: Negative.   Hematological: Negative.   Psychiatric/Behavioral: Positive for dysphoric mood. Negative for suicidal ideas, hallucinations, behavioral problems, confusion, sleep disturbance, self-injury, decreased concentration and agitation. The patient is not nervous/anxious and is not hyperactive.     Vitals: BP: 119/77 Pulse: 80 Temperature: 98.27F Height: 5 feet 5 inches Weight: 166 pounds     Objective:   Physical Exam  Constitutional: She is oriented to person, place, and time. She appears well-developed and well-nourished.  HENT:  Head: Normocephalic and atraumatic.  Eyes: EOM are normal. Pupils are equal, round, and reactive to light.  Neck: Normal range of motion. Neck supple. No tracheal deviation present. No thyromegaly present.  Cardiovascular: Normal rate, regular rhythm and normal heart sounds.   No murmur heard. Pulmonary/Chest: Effort normal and breath sounds normal. No respiratory distress. She has no wheezes. She has no rales.  Abdominal: Soft. Bowel sounds are normal. She exhibits no distension. There is no tenderness. There is no rebound and no guarding.  Genitourinary: Vaginal discharge found.       Patient status post hysterectomy, there was a slight white discharge in  the vaginal vault.  Musculoskeletal: Normal range of motion. She exhibits no edema and no tenderness.  Lymphadenopathy:    She has no cervical adenopathy.  Neurological: She is alert and oriented to person, place, and time. No cranial nerve deficit.  Skin: Skin is warm and dry. No rash noted. No erythema. No pallor.    Psychiatric: She has a normal mood and affect. Her behavior is normal. Judgment and thought content normal.          Assessment & Plan:   1. Vaginal discharge-we did do a pelvic exam today, and check a wet prep. She is already on prophylactic treatment for yeast infection so this seems less likely. Patient states this is like her prior episode of BV. There is no a lot of discharge on exam, however what prep was sent. We will treat prophylactically for BV with metronidazole 500 twice daily for 7 days. We will call her with the results of the wet prep if there is are any changes. If the symptoms do not resolve in the next 3-4 days she is asked to call us back and possibly come back. She has had multiple negative screenings for gonorrhea and chlamydia. She has not changed sexual partners so it does not seem prudent to check a gonorrhea Chlamydia today. We also told the patient to have her partner shower prior to sex in order to avoid infection and we prescribed her a vaginal metronidazole gel to use as prophylaxis for bacterial vaginosis.  2. Please see problem-oriented charting.  3. Disposition-patient will be seen back in 6 months. She is asked to call our office if her vaginal discharge does not resolve or worsens.

## 2011-03-13 NOTE — Assessment & Plan Note (Signed)
Patient currently unmedicated, she states that she does have occasional decrease in her mood. She states that they'll with rebound. She is not suicidal or homicidal at today's visit. She states that she's never been suicidal. I advised her to increase her exercise level and to work on taking time for herself. I've advised her that this does worsen or she does feel like she might need medication in the future to please call our office we would love to see her back.

## 2011-03-14 LAB — WET PREP BY MOLECULAR PROBE
Candida species: NEGATIVE
Trichomonas vaginosis: NEGATIVE

## 2011-04-04 LAB — WET PREP, GENITAL: Yeast Wet Prep HPF POC: NONE SEEN

## 2011-04-04 LAB — GC/CHLAMYDIA PROBE AMP, GENITAL: Chlamydia, DNA Probe: NEGATIVE

## 2011-04-14 LAB — POCT URINALYSIS DIP (DEVICE)
Ketones, ur: NEGATIVE
Nitrite: NEGATIVE
Operator id: 235561
Protein, ur: NEGATIVE
Urobilinogen, UA: 1
pH: 8.5 — ABNORMAL HIGH

## 2011-04-14 LAB — WET PREP, GENITAL: Trich, Wet Prep: NONE SEEN

## 2011-04-14 LAB — GC/CHLAMYDIA PROBE AMP, GENITAL: GC Probe Amp, Genital: NEGATIVE

## 2011-04-16 LAB — WET PREP, GENITAL
Trich, Wet Prep: NONE SEEN
Yeast Wet Prep HPF POC: NONE SEEN

## 2011-04-16 LAB — POCT URINALYSIS DIP (DEVICE)
Bilirubin Urine: NEGATIVE
Glucose, UA: NEGATIVE
Ketones, ur: NEGATIVE
Operator id: 247071
Protein, ur: 30 — AB
Specific Gravity, Urine: 1.015

## 2011-04-16 LAB — POCT PREGNANCY, URINE: Preg Test, Ur: NEGATIVE

## 2011-04-16 LAB — GC/CHLAMYDIA PROBE AMP, GENITAL
Chlamydia, DNA Probe: NEGATIVE
GC Probe Amp, Genital: NEGATIVE

## 2011-09-06 ENCOUNTER — Other Ambulatory Visit: Payer: Self-pay | Admitting: Family Medicine

## 2011-09-07 NOTE — Telephone Encounter (Signed)
Refill request

## 2011-09-09 ENCOUNTER — Other Ambulatory Visit: Payer: Self-pay | Admitting: *Deleted

## 2011-09-09 MED ORDER — METRONIDAZOLE 0.75 % VA GEL: VAGINAL | Status: DC

## 2011-09-09 MED ORDER — FLUCONAZOLE 150 MG PO TABS: 150.0000 mg | ORAL_TABLET | Freq: Every day | ORAL | Status: DC

## 2011-09-09 NOTE — Telephone Encounter (Signed)
Will Rx but pt needs to be seen, does she have any future appointments? If not, can you schedule her with me? Thanks!

## 2011-09-09 NOTE — Telephone Encounter (Signed)
Message sent to front desk

## 2011-09-11 NOTE — Telephone Encounter (Signed)
Correction made on directions of diflucan, pt to take once a week not daily.

## 2011-09-24 ENCOUNTER — Encounter: Payer: Self-pay | Admitting: Internal Medicine

## 2011-09-24 ENCOUNTER — Ambulatory Visit (INDEPENDENT_AMBULATORY_CARE_PROVIDER_SITE_OTHER): Payer: Self-pay | Admitting: Internal Medicine

## 2011-09-24 VITALS — BP 121/74 | HR 89 | Temp 99.4°F | Ht 65.0 in | Wt 170.3 lb

## 2011-09-24 DIAGNOSIS — F329 Major depressive disorder, single episode, unspecified: Secondary | ICD-10-CM

## 2011-09-24 DIAGNOSIS — I10 Essential (primary) hypertension: Secondary | ICD-10-CM

## 2011-09-24 DIAGNOSIS — E785 Hyperlipidemia, unspecified: Secondary | ICD-10-CM

## 2011-09-24 MED ORDER — LISINOPRIL 10 MG PO TABS: 10.0000 mg | ORAL_TABLET | Freq: Every day | ORAL | Status: DC

## 2011-09-24 MED ORDER — SIMVASTATIN 40 MG PO TABS: 40.0000 mg | ORAL_TABLET | Freq: Every day | ORAL | Status: DC

## 2011-09-24 MED ORDER — CITALOPRAM HYDROBROMIDE 20 MG PO TABS: 20.0000 mg | ORAL_TABLET | Freq: Every day | ORAL | Status: DC

## 2011-09-24 MED ORDER — FLUCONAZOLE 150 MG PO TABS: ORAL_TABLET | ORAL | Status: DC

## 2011-09-24 MED ORDER — HYDROCHLOROTHIAZIDE 25 MG PO TABS: 25.0000 mg | ORAL_TABLET | Freq: Every day | ORAL | Status: DC

## 2011-09-24 MED ORDER — METRONIDAZOLE 0.75 % VA GEL: VAGINAL | Status: DC

## 2011-09-24 NOTE — Assessment & Plan Note (Signed)
Well controlled. Will continue current regimen.

## 2011-09-24 NOTE — Assessment & Plan Note (Signed)
LDL of 222 in January of 2012. Will need to repeat a lipid panel next office visit because she may need to increase her simvastatin dosage. I will defer this to her PCP -Will continue simvastatin 40 mg by mouth daily for now

## 2011-09-24 NOTE — Progress Notes (Signed)
History of present illness: [name] is a 46 year old woman with past medical history hyperlipidemia, hypertension, GERD, depression presents today for followup. Patient has not been seen since August of 2012.  She was calling for refill but the direction on the refill for the Diflucan was wrong (q daily instead of q weekly) so she came in today.  She denies any complaints such as vaginal discharge, n/v, abdominal pain, SOB.  She does have 6 months of depression in which she feels sad, loss of interest, + guilt, sleeping more, does not enjoy things she used to do. She states that she just goes to work and then home and sleep.  She denies any suicidal or homicidal ideation. She would like to be on SSRI but states that she can probably cannot afford it.  Would like to try it if it's on the $4 plan at [facility]. No other complaints today.  PH Q9 :  11 which is moderate depression  Review of system: as per history of present illness  PE: General: alert, well-developed, and cooperative to examination.  Lungs: normal respiratory effort, no accessory muscle use, normal breath sounds, no crackles, and no wheezes. Heart: normal rate, regular rhythm, no murmur, no gallop, and no rub.  Abdomen: soft, non-tender, normal bowel sounds, no distention, no guarding, no rebound tenderness, no hepatomegaly, and no splenomegaly.  Msk: no joint swelling, no joint warmth, and no redness over joints.  Pulses: 2+ DP/PT pulses bilaterally Extremities: No cyanosis, clubbing, edema Neurologic: alert & oriented X3, cranial nerves II-XII intact, strength normal in all extremities, sensation intact to light touch, and gait normal.  Psych: slightly depressed.

## 2011-09-24 NOTE — Patient Instructions (Signed)
Start taking Celexa 20 mg by mouth daily, this medication may make you more depressed or if he starts feeling suicidal, you need to inform our office and stopped taking the medication. Continued your current medications Followup with Dr. Anselm Jungling in 2-3 weeks

## 2011-09-24 NOTE — Assessment & Plan Note (Signed)
Not well-controlled. She reports having depression symptoms in the past 6 months. She denies any grief reaction or adjustment. I did her PHQ-9 question which shows a score of 11-moderate depression. Patient does report taking St Wort in the past but now stopped because it does not help her anymore. She was not started on an SSRI due to financial problem. She would like to start an antidepressant today. -Will start Celexa 20 mg by mouth daily. I explained the side effects of SSRI including increasing depression, suicidal, homicidal ideation. Patient was instructed to stop the medication if she experienced these symptoms and call office. She voiced her understanding. -Will followup in 2-3 weeks to reevaluate. I will do another PHQ- 9 question next office visit to see if there is any improvement because we may need to try to treat up the dosage

## 2011-10-14 ENCOUNTER — Encounter: Payer: Self-pay | Admitting: Internal Medicine

## 2011-10-29 ENCOUNTER — Ambulatory Visit (INDEPENDENT_AMBULATORY_CARE_PROVIDER_SITE_OTHER): Payer: Self-pay | Admitting: Internal Medicine

## 2011-10-29 ENCOUNTER — Encounter: Payer: Self-pay | Admitting: Internal Medicine

## 2011-10-29 VITALS — BP 123/78 | HR 91 | Temp 97.8°F | Wt 168.0 lb

## 2011-10-29 DIAGNOSIS — E785 Hyperlipidemia, unspecified: Secondary | ICD-10-CM

## 2011-10-29 DIAGNOSIS — F329 Major depressive disorder, single episode, unspecified: Secondary | ICD-10-CM

## 2011-10-29 NOTE — Assessment & Plan Note (Signed)
Check FLP Forward results to pcp if not at goal

## 2011-10-29 NOTE — Progress Notes (Signed)
Patient ID: Ann Stone, female   DOB: Jul 13, 1966, 46 y.o.   MRN: [id number]  46 Y/o w with pmh listed below comes for checkup No complaints today Complaint with medications No medications side effect Uptodate on refills See individual a/p for further details.   Physical exam  General Appearance:     Filed Vitals:   10/29/11 1507  BP: 123/78  Pulse: 91  Temp: 97.8 F (36.6 C)  TempSrc: Oral  Weight: 168 lb (76.204 kg)     Alert, cooperative, no distress, appears stated age  Head:    Normocephalic, without obvious abnormality, atraumatic  Eyes:    PERRL, conjunctiva/corneas clear, EOM's intact, fundi    benign, both eyes       Neck:   Supple, symmetrical, trachea midline, no adenopathy;       thyroid:  No enlargement/tenderness/nodules; no carotid   bruit or JVD  Lungs:     Clear to auscultation bilaterally, respirations unlabored  Chest wall:    No tenderness or deformity  Heart:    Regular rate and rhythm, S1 and S2 normal, no murmur, rub   or gallop  Abdomen:     Soft, non-tender, bowel sounds active all four quadrants,    no masses, no organomegaly  Extremities:   Extremities normal, atraumatic, no cyanosis or edema  Pulses:   2+ and symmetric all extremities  Skin:   Skin color, texture, turgor normal, no rashes or lesions  Neurologic:  nonfocal grossly    ROS  Constitutional: Denies fever, chills, diaphoresis, appetite change and fatigue.  Respiratory: Denies SOB, DOE, cough, chest tightness,  and wheezing.   Cardiovascular: Denies chest pain, palpitations and leg swelling.  Gastrointestinal: Denies nausea, vomiting, abdominal pain, diarrhea, constipation, blood in stool and abdominal distention.  Skin: Denies pallor, rash and wound.  Neurological: Denies dizziness, light-headedness, numbness and headaches.

## 2011-10-29 NOTE — Assessment & Plan Note (Signed)
Patient is feeling much better PHQ 9- 2-3 Celexa 20 mg for 4 weeks now  Continue current dose.  Encouraged her to let us know if symptoms getting worse so we can go up on the dose or change to a different medication

## 2011-10-30 LAB — LIPID PANEL
HDL: 41 mg/dL (ref >39)
LDL Cholesterol: 134 mg/dL — ABNORMAL HIGH (ref 0–99)
Total CHOL/HDL Ratio: 5.2 Ratio
Triglycerides: 197 mg/dL — ABNORMAL HIGH (ref <150)
VLDL: 39 mg/dL (ref 0–40)

## 2012-02-25 ENCOUNTER — Encounter: Payer: Self-pay | Admitting: Internal Medicine

## 2012-02-25 ENCOUNTER — Ambulatory Visit (INDEPENDENT_AMBULATORY_CARE_PROVIDER_SITE_OTHER): Payer: Self-pay | Admitting: Internal Medicine

## 2012-02-25 VITALS — BP 123/76 | HR 98 | Temp 100.0°F | Ht 65.0 in | Wt 174.5 lb

## 2012-02-25 DIAGNOSIS — F17200 Nicotine dependence, unspecified, uncomplicated: Secondary | ICD-10-CM

## 2012-02-25 DIAGNOSIS — I10 Essential (primary) hypertension: Secondary | ICD-10-CM

## 2012-02-25 DIAGNOSIS — F329 Major depressive disorder, single episode, unspecified: Secondary | ICD-10-CM

## 2012-02-25 DIAGNOSIS — E785 Hyperlipidemia, unspecified: Secondary | ICD-10-CM

## 2012-02-25 MED ORDER — METRONIDAZOLE 0.75 % VA GEL: VAGINAL | Status: DC

## 2012-02-25 MED ORDER — HYDROCHLOROTHIAZIDE 25 MG PO TABS: 25.0000 mg | ORAL_TABLET | Freq: Every day | ORAL | Status: DC

## 2012-02-25 MED ORDER — SIMVASTATIN 40 MG PO TABS: 40.0000 mg | ORAL_TABLET | Freq: Every day | ORAL | Status: DC

## 2012-02-25 MED ORDER — FLUCONAZOLE 150 MG PO TABS: ORAL_TABLET | ORAL | Status: DC

## 2012-02-25 MED ORDER — CITALOPRAM HYDROBROMIDE 40 MG PO TABS: 40.0000 mg | ORAL_TABLET | Freq: Every day | ORAL | Status: DC

## 2012-02-25 MED ORDER — LISINOPRIL 10 MG PO TABS: 10.0000 mg | ORAL_TABLET | Freq: Every day | ORAL | Status: DC

## 2012-02-25 NOTE — Assessment & Plan Note (Signed)
Blood pressure well-controlled at today's visit on hydrochlorothiazide and lisinopril. Will not change her medical regimen at today's visit. We'll see her back in 6 months.

## 2012-02-25 NOTE — Assessment & Plan Note (Addendum)
The patient states that the Celexa originally prescribed in May did help partially but she is still having a little bit of depression. Will increase her dose from 20 mg to 40 mg at today's visit. Denies SI/HI.

## 2012-02-25 NOTE — Progress Notes (Signed)
Subjective:     Patient ID: Ann Stone, female   DOB: 01-13-1966, 46 y.o.   MRN: [id number]  HPI The patient is a 46 year old female who comes in today for a regular checkup visit. She has significant past medical history of hyperlipidemia, smoking, hypertension, GERD. She also has history of depression and was re\re she only started on Celexa and may. She states that it was working originally however she's not getting quit the benefit she used to at this time. She also is continuing to smoke but would like to quit soon. No other complaints at today's visit.   Review of Systems  Constitutional: Negative for fever, chills, activity change, fatigue and unexpected weight change.  Respiratory: Negative for choking, chest tightness and wheezing.   Cardiovascular: Negative for chest pain, palpitations and leg swelling.  Gastrointestinal: Negative for nausea, vomiting, abdominal pain, diarrhea and constipation.  Musculoskeletal: Negative.   Skin: Negative.   Neurological: Negative.   Psychiatric/Behavioral: Positive for dysphoric mood. Negative for suicidal ideas, hallucinations, behavioral problems, confusion, disturbed wake/sleep cycle, self-injury, decreased concentration and agitation. The patient is not nervous/anxious and is not hyperactive.        Objective:   Physical Exam  Constitutional: She is oriented to person, place, and time. She appears well-developed and well-nourished.  HENT:  Head: Normocephalic and atraumatic.  Eyes: EOM are normal. Pupils are equal, round, and reactive to light.  Neck: Normal range of motion. Neck supple.  Cardiovascular: Normal rate and regular rhythm.   Pulmonary/Chest: Effort normal and breath sounds normal.  Abdominal: Soft. Bowel sounds are normal.  Musculoskeletal: Normal range of motion.  Neurological: She is alert and oriented to person, place, and time.  Skin: Skin is warm and dry.  Psychiatric: She has a normal mood and affect. Her behavior is  normal. Judgment and thought content normal.       Assessment/Plan:   1. Please see problem-oriented charting.  2. Disposition-the patient will have increasing her Celexa to 40 mg daily, no other changes to medications at today's visit. Will be seen back in 6 months.

## 2012-02-25 NOTE — Patient Instructions (Addendum)
You were seen today for a check up and we are going to increase your celexa dose to 40 mg daily. Until you get the new prescription you can take 2 of your current pills daily. We will see you back in 6 months if everything is going well. If you have problems or questions before then please call our office at [phone].

## 2012-02-25 NOTE — Assessment & Plan Note (Signed)
The patient is currently still smoking about half to one pack per day. She states that she would like to quit and she is planning to do the patches. I did talk to her about 1 800 quit now.

## 2012-02-25 NOTE — Assessment & Plan Note (Signed)
Patient continues to take simvastatin 40 mg daily and advised to work on diet and exercise to bring her cholesterol closer to goal. She does not wish to switch medicines at this time as there are no $4 medications that are stronger than her current medication.

## 2012-03-24 ENCOUNTER — Encounter: Payer: Self-pay | Admitting: Internal Medicine

## 2012-03-24 ENCOUNTER — Ambulatory Visit (INDEPENDENT_AMBULATORY_CARE_PROVIDER_SITE_OTHER): Payer: Self-pay | Admitting: Internal Medicine

## 2012-03-24 VITALS — BP 132/85 | HR 94 | Temp 98.8°F | Ht 65.0 in | Wt 178.3 lb

## 2012-03-24 DIAGNOSIS — S0300XA Dislocation of jaw, unspecified side, initial encounter: Secondary | ICD-10-CM | POA: Insufficient documentation

## 2012-03-24 DIAGNOSIS — X58XXXA Exposure to other specified factors, initial encounter: Secondary | ICD-10-CM

## 2012-03-24 MED ORDER — CYCLOBENZAPRINE HCL 10 MG PO TABS: 10.0000 mg | ORAL_TABLET | Freq: Three times a day (TID) | ORAL | Status: AC | PRN

## 2012-03-24 MED ORDER — NAPROXEN 500 MG PO TABS: 500.0000 mg | ORAL_TABLET | Freq: Two times a day (BID) | ORAL | Status: AC

## 2012-03-24 NOTE — Patient Instructions (Addendum)
Start taking Naproxen 500mg  one tablet twice daily x 14 days Start taking Flexeril 10mg  up to 3 tablets daily as needed, be caution that this medication may make you very drowsy Follow up with Dr. Anselm Jungling in 2 weeks if no improvement

## 2012-03-24 NOTE — Progress Notes (Signed)
History of present illness: [name] is a 46 year old woman with past medical history of hypertension, smoker, hyperlipidemia, GERD, depression presents today for jaw pain. She states that the jaw pain has been going on for 2 weeks.  She also had an episode similar to this last year and it resolved.  This time she has ear pain which last for a few seconds and she can hear it popping and then lock her jaw, cannot really opens her mouth big.  Has pain when she chews on food.  She has been taking ibuprofen ~6 tablets per day with some relief.  Pain is worse when she wakes up in the morning.  She states that her depression is improving with the increased dose of celexa Headache: no  Ear pain//stuffiness:yes, popping sensation Facial pain:no  ROS: as per HPI  PE: General: alert, well-developed, and cooperative to examination.  Mouth: tenderness to palpation at TMJ on right side, no tenderness to palpation of the mastication muscle, no erythema or drainage noted.  Mouth opening is limited. No cavities Lungs: normal respiratory effort, no accessory muscle use, normal breath sounds, no crackles, and no wheezes. Heart: normal rate, regular rhythm, no murmur, no gallop, and no rub.  Abdomen: soft, non-tender, normal bowel sounds, no distention, no guarding, no rebound tenderness Neurologic: alert & oriented X3, cranial nerves II-XII intact, strength normal in all extremities, sensation intact to light touch, and gait normal.  Skin: turgor normal and no rashes.  Psych: Oriented X3, memory intact for recent and remote, normally interactive, good eye contact, not anxious appearing, and not depressed appearing.

## 2012-03-24 NOTE — Assessment & Plan Note (Signed)
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 Patient has been taking ibuprofen sporadically with some relief. -Will prescribe naproxen 500 mg by mouth twice a day x14 days -Prescribe flexeril 10 mg by mouth 8 hours when necessary #20 -I also gave patient information on TMJ from up to date:    Things that she can do a home to help with her TMJ include:  Avoid doing things that make the pain worse, such opening your mouth too wide.  Eat soft foods that don't require a lot of chewing.  Practice relaxing - You can learn methods to relax your body, such as doing deep breathing exercises. Ask your doctor or nurse about these methods. Relaxing the mind can help with how the body feels pain. People can learn to quiet their pain or make it less bothersome.  Use ice packs to ease the pain - Use a bag of ice, bag of frozen peas, or cold gel pack once every 2 hours, for 20 minutes each time. Put a towel or cloth between the ice and your skin. Do not put the ice directly on your skin.  Put heat on the painful area - Wet a clean washcloth with warm water and put it on the area. When the washcloth cools, reheat it with warm water and put it back on. Repeat these steps for 10 to 15 minutes every few hours.  Avoid stimulants, such as coffee, tea, colas, or decongestant medicines, since these can make your anxiety worse. -Followup in 2 weeks if no improvement, she may need mouth guard the patient does not have insurance.

## 2012-11-10 ENCOUNTER — Other Ambulatory Visit: Payer: Self-pay | Admitting: Internal Medicine

## 2012-11-10 DIAGNOSIS — F329 Major depressive disorder, single episode, unspecified: Secondary | ICD-10-CM

## 2013-01-12 ENCOUNTER — Other Ambulatory Visit: Payer: Self-pay | Admitting: *Deleted

## 2013-01-12 MED ORDER — FLUCONAZOLE 150 MG PO TABS: ORAL_TABLET | ORAL | Status: DC

## 2013-01-12 NOTE — Telephone Encounter (Signed)
Will fill this rx with no refills but she needs to be seen back in the office before any more refills can be given.  Dr. Dorise Hiss

## 2013-02-26 ENCOUNTER — Other Ambulatory Visit: Payer: Self-pay | Admitting: Internal Medicine

## 2013-02-28 NOTE — Telephone Encounter (Signed)
Pt has appt 8/29 w/ you, plans to keep it

## 2013-03-11 ENCOUNTER — Encounter: Payer: Self-pay | Admitting: Internal Medicine

## 2013-03-17 ENCOUNTER — Other Ambulatory Visit: Payer: Self-pay | Admitting: Internal Medicine

## 2013-03-28 ENCOUNTER — Other Ambulatory Visit: Payer: Self-pay | Admitting: *Deleted

## 2013-03-28 DIAGNOSIS — F329 Major depressive disorder, single episode, unspecified: Secondary | ICD-10-CM

## 2013-03-28 MED ORDER — HYDROCHLOROTHIAZIDE 25 MG PO TABS: ORAL_TABLET | ORAL | Status: DC

## 2013-03-28 MED ORDER — CITALOPRAM HYDROBROMIDE 40 MG PO TABS: 40.0000 mg | ORAL_TABLET | Freq: Every day | ORAL | Status: DC

## 2013-03-28 MED ORDER — LISINOPRIL 10 MG PO TABS: 10.0000 mg | ORAL_TABLET | Freq: Every day | ORAL | Status: DC

## 2013-03-28 NOTE — Telephone Encounter (Signed)
Pt is asking for 1 month refill.  She will make appointment before she runs out.

## 2013-03-28 NOTE — Telephone Encounter (Signed)
Pt informed

## 2013-03-28 NOTE — Telephone Encounter (Signed)
Patient no showed for last appointment and needs to be seen prior to future refills. Please schedule her a visit and clarify to her that she needs to come in.  Dr. Dorise Hiss

## 2013-04-21 ENCOUNTER — Other Ambulatory Visit: Payer: Self-pay | Admitting: *Deleted

## 2013-04-21 DIAGNOSIS — F329 Major depressive disorder, single episode, unspecified: Secondary | ICD-10-CM

## 2013-04-21 NOTE — Telephone Encounter (Signed)
These will be declined as she was informed 1 month ago that she needed to be seen within the month before her medications ran out and then she did not show to an appointment. She can call in and make an appointment with someone in the clinic emergently for medication refill.  Dr. Dorise Hiss

## 2013-04-22 NOTE — Telephone Encounter (Signed)
Pt is out of meds, scheduled appointment made for Monday so she can get refills.  Also has appointment 11/3 with [facility]

## 2013-04-25 ENCOUNTER — Ambulatory Visit (INDEPENDENT_AMBULATORY_CARE_PROVIDER_SITE_OTHER): Payer: Self-pay | Admitting: Internal Medicine

## 2013-04-25 ENCOUNTER — Encounter: Payer: Self-pay | Admitting: Internal Medicine

## 2013-04-25 VITALS — BP 158/85 | HR 85 | Temp 97.0°F | Ht 65.0 in | Wt 198.2 lb

## 2013-04-25 DIAGNOSIS — F329 Major depressive disorder, single episode, unspecified: Secondary | ICD-10-CM

## 2013-04-25 DIAGNOSIS — I10 Essential (primary) hypertension: Secondary | ICD-10-CM

## 2013-04-25 DIAGNOSIS — E785 Hyperlipidemia, unspecified: Secondary | ICD-10-CM

## 2013-04-25 DIAGNOSIS — F3289 Other specified depressive episodes: Secondary | ICD-10-CM

## 2013-04-25 MED ORDER — CITALOPRAM HYDROBROMIDE 40 MG PO TABS: 40.0000 mg | ORAL_TABLET | Freq: Every day | ORAL | Status: DC

## 2013-04-25 MED ORDER — HYDROCHLOROTHIAZIDE 25 MG PO TABS: ORAL_TABLET | ORAL | Status: DC

## 2013-04-25 MED ORDER — BLOOD PRESSURE KIT: 1.0000 | PACK | Freq: Two times a day (BID) | Status: DC

## 2013-04-25 MED ORDER — SIMVASTATIN 40 MG PO TABS: 40.0000 mg | ORAL_TABLET | Freq: Every day | ORAL | Status: DC

## 2013-04-25 MED ORDER — LISINOPRIL 20 MG PO TABS: 20.0000 mg | ORAL_TABLET | Freq: Every day | ORAL | Status: DC

## 2013-04-25 NOTE — Progress Notes (Signed)
Patient ID: Ann Stone, female   DOB: March 08, 1966, 47 y.o.   MRN: [id number]   Subjective:   HPI: [name] is a 47 y.o. with this history of depression, hypertension, hyperlipidemia to cigarette smoking, presents to the clinic for routine followup visit.  She has no specific complaints. She requests for refill of her medications. Her last visit was more than a year ago. She reports, that she's been trying to spread out her pills of high blood pressure so that they can last to this appointment. However, she took her hydrochlorothiazide and lisinopril in the morning of presentation.  Kindly see the A&P for the status of the pt's chronic medical problems.  Past Medical History  Diagnosis Date  . Leiomyoma of uterus     s/p hysterectomy, hx of uterine hemorrhage  . HTN (hypertension)   . Hyperlipemia   . Depression   . Tobacco abuse   . GERD (gastroesophageal reflux disease)    Current Outpatient Prescriptions  Medication Sig Dispense Refill  . citalopram (CELEXA) 40 MG tablet Take 1 tablet (40 mg total) by mouth daily.  30 tablet  0  . fluconazole (DIFLUCAN) 150 MG tablet TAKE ONE TABLET BY MOUTH WEEKLY, FOR FLARES TAKE 1 TABLET ONCE DAILY FOR 3 DAYS.  7 tablet  0  . hydrochlorothiazide (HYDRODIURIL) 25 MG tablet TAKE ONE TABLET BY MOUTH EVERY DAY  90 tablet  3  . lisinopril (PRINIVIL,ZESTRIL) 20 MG tablet Take 1 tablet (20 mg total) by mouth daily.  30 tablet  3  . metroNIDAZOLE (METROGEL VAGINAL) 0.75 % vaginal gel Apply to your vagina prior to having sex.  70 g  3  . simvastatin (ZOCOR) 40 MG tablet Take 1 tablet (40 mg total) by mouth at bedtime.  30 tablet  6  . Blood Pressure KIT 1 kit by Does not apply route 2 (two) times daily.  1 each  0   No current facility-administered medications for this visit.   No family history on file. History   Social History  . Marital Status: Single    Spouse Name: N/A    Number of Children: N/A  . Years of Education: N/A   Social  History Main Topics  . Smoking status: Current Every Day Smoker -- 0.50 packs/day    Types: Cigarettes  . Smokeless tobacco: None  . Alcohol Use: No  . Drug Use: No  . Sexual Activity: None   Other Topics Concern  . None   Social History Narrative  . None   Review of Systems: Constitutional: Denies fever, chills, diaphoresis, appetite change and fatigue.  Respiratory: Denies SOB, DOE, cough, chest tightness, and wheezing. Denies chest pain. Cardiovascular: No chest pain, palpitations and leg swelling.  Gastrointestinal: No abdominal pain, nausea, vomiting, bloody stools Genitourinary: No dysuria, frequency, hematuria, or flank pain.  Musculoskeletal: No myalgias, back pain, joint swelling, arthralgias  Psych: She reports that her depression symptoms where initially well controlled with Citalopram but recently this medication has not been very effective. No SI or SA.   Objective:  Physical Exam: Filed Vitals:   04/25/13 1350  BP: 158/85  Pulse: 85  Temp: 97 F (36.1 C)  TempSrc: Oral  Height: 5\' 5"  (1.651 m)  Weight: 198 lb 3.2 oz (89.903 kg)  SpO2: 99%   General: Well nourished. No acute distress.  HEENT: Normal oral mucosa. MMM.  Lungs: CTA bilaterally. Heart: RRR; no extra sounds or murmurs  Abdomen: Non-distended, normal BS, soft, nontender; no hepatosplenomegaly  Extremities: No pedal edema. No joint swelling or tenderness. Neurologic: Normal EOM,  Alert and oriented x3. No obvious neurologic/cranial nerve deficits.  Assessment & Plan:  I have discussed my assessment and plan  with  my attending in the clinic, Dr. Josem Kaufmann  as detailed under problem based charting.

## 2013-04-25 NOTE — Assessment & Plan Note (Signed)
BP Readings from Last 3 Encounters:  04/25/13 158/85  03/24/12 132/85  02/25/12 123/76    Lab Results  Component Value Date   NA 140 07/22/2010   K 3.8 07/22/2010   CREATININE 0.75 07/22/2010    Assessment: Blood pressure control: moderately elevated Progress toward BP goal:  deteriorated Comments: she has been away from clinic for more than a year. She has been spreading out her meds lately so that they can last longer.  Plan: Medications:  Increase Lisinopril from 10 mg to 20 mg daily. Continue with HCTZ at 25 mg once daily.  Educational resources provided:   Self management tools provided: home blood pressure logbook;home blood pressure meter Other plans: Will check BMET today  Will follow up in one months with PCP.

## 2013-04-25 NOTE — Patient Instructions (Addendum)
General Instructions: Please increase the dose of Lisinopril to 20 mg daily  Please take the rest of your medications as before  Please follow up with your regular doctors in one months.    Treatment Goals:  Goals (1 Years of Data) as of 04/25/13         As of Today 03/24/12     Blood Pressure    . Blood Pressure < 140/90  158/85 132/85      Progress Toward Treatment Goals:  Treatment Goal 04/25/2013  Blood pressure deteriorated    Self Care Goals & Plans:  Self Care Goal 04/25/2013  Manage my medications take my medicines as prescribed; bring my medications to every visit; refill my medications on time    No flowsheet data found.   Care Management & Community Referrals:  No flowsheet data found.

## 2013-04-25 NOTE — Progress Notes (Signed)
Case discussed with Dr. Kazibwe at the time of the visit.  We reviewed the resident's history and exam and pertinent patient test results.  I agree with the assessment, diagnosis and plan of care documented in the resident's note. 

## 2013-04-25 NOTE — Assessment & Plan Note (Signed)
Pt reports that she feels like her depression is less well controlled of late. No SI or SA. She has been on Citalopram for 1 year and it used to be more effective when she first started. I offered to change her medication today but she wishes to discuss this with her Dr Dorise Hiss during her appt on 05/16/2013. Will defer to Dr Dorise Hiss.

## 2013-04-26 LAB — BASIC METABOLIC PANEL WITH GFR
Calcium: 9 mg/dL (ref 8.4–10.5)
Chloride: 99 mEq/L (ref 96–112)
Creat: 0.62 mg/dL (ref 0.50–1.10)
GFR, Est Non African American: >89 mL/min
Potassium: 3.5 mEq/L (ref 3.5–5.3)

## 2013-05-16 ENCOUNTER — Encounter: Payer: Self-pay | Admitting: Internal Medicine

## 2013-05-16 ENCOUNTER — Ambulatory Visit (INDEPENDENT_AMBULATORY_CARE_PROVIDER_SITE_OTHER): Payer: Self-pay | Admitting: Internal Medicine

## 2013-05-16 VITALS — BP 136/91 | HR 76 | Temp 98.5°F | Ht 65.0 in | Wt 199.5 lb

## 2013-05-16 DIAGNOSIS — E785 Hyperlipidemia, unspecified: Secondary | ICD-10-CM

## 2013-05-16 DIAGNOSIS — I10 Essential (primary) hypertension: Secondary | ICD-10-CM

## 2013-05-16 DIAGNOSIS — F329 Major depressive disorder, single episode, unspecified: Secondary | ICD-10-CM

## 2013-05-16 DIAGNOSIS — F17200 Nicotine dependence, unspecified, uncomplicated: Secondary | ICD-10-CM

## 2013-05-16 DIAGNOSIS — Z23 Encounter for immunization: Secondary | ICD-10-CM

## 2013-05-16 MED ORDER — FLUCONAZOLE 150 MG PO TABS: ORAL_TABLET | ORAL | Status: DC

## 2013-05-16 MED ORDER — FLUOXETINE HCL 20 MG PO CAPS: 20.0000 mg | ORAL_CAPSULE | Freq: Every day | ORAL | Status: DC

## 2013-05-16 NOTE — Assessment & Plan Note (Signed)
  Assessment: Progress toward smoking cessation:  smoking the same amount Barriers to progress toward smoking cessation:  stress Comments: She would like to try to quit after the holidays  Plan: Instruction/counseling given:  I counseled patient on the dangers of tobacco use, advised patient to stop smoking, and reviewed strategies to maximize success. Educational resources provided:    Self management tools provided:    Medications to assist with smoking cessation:  None Patient agreed to the following self-care plans for smoking cessation:    Other plans:

## 2013-05-16 NOTE — Assessment & Plan Note (Signed)
BP Readings from Last 3 Encounters:  05/16/13 150/92  04/25/13 158/85  03/24/12 132/85   Recheck BP today was 134/90  Lab Results  Component Value Date   NA 139 04/25/2013   K 3.5 04/25/2013   CREATININE 0.62 04/25/2013    Assessment: Blood pressure control: mildly elevated Progress toward BP goal:  improved Comments:   Plan: Medications:  Continue HCTZ 25 mg daily and increase lisinopril to 20 mg daily Educational resources provided:   Self management tools provided: instructions for home blood pressure monitoring Other plans: Last BMP normal without hypokalemia or signs of renal insufficiency

## 2013-05-16 NOTE — Patient Instructions (Addendum)
General Instructions:  We will have you stop the celexa and start fluoxetine (prozac). It should be $4 at [facility] and will work on your mood as well. We will see you back in February and can make adjustments then. If you have any problems prior to then please call us and come in sooner.   We have given you the flu shot today.   Call us with any questions or problems at [phone].  Treatment Goals:  Goals (1 Years of Data) as of 05/16/13         As of Today 04/25/13 03/24/12     Blood Pressure    . Blood Pressure < 140/90  150/92 158/85 132/85      Progress Toward Treatment Goals:  Treatment Goal 05/16/2013  Blood pressure improved  Stop smoking smoking the same amount    Self Care Goals & Plans:  Self Care Goal 05/16/2013  Manage my medications take my medicines as prescribed  Monitor my health keep track of my weight  Eat healthy foods eat foods that are low in salt  Be physically active take a walk every day  Meeting treatment goals maintain the current self-care plan    No flowsheet data found.   Care Management & Community Referrals:  Referral 05/16/2013  Referrals made for care management support none needed

## 2013-05-16 NOTE — Progress Notes (Signed)
Case discussed with Dr. Kollar soon after the resident saw the patient.  We reviewed the resident's history and exam and pertinent patient test results.  I agree with the assessment, diagnosis, and plan of care documented in the resident's note. 

## 2013-05-16 NOTE — Assessment & Plan Note (Signed)
Continue Simvastatin 40 mg daily and will recheck lipid panel at next visit. Last LDL 134 and at goal.

## 2013-05-16 NOTE — Progress Notes (Signed)
Subjective:     Patient ID: Ann Stone, female   DOB: June 26, 1966, 47 y.o.   MRN: [id number]  HPI  The patient is a 47 year old female who comes in today for a regular checkup visit. She was recently seen and evaluated for her blood pressure and her lisinopril was increased from 10 mg to 20 mg daily and she states that she has not made that change yet. She does not think that her celexa is working well for her moods and she still has some mood swings. She has significant past medical history of hyperlipidemia, smoking, hypertension. She is smoking about 2/3 of a pack per day and would like to stop smoking but thinks that a realistic time to try to quit would be after the holidays so we will revisit that at next visit. No other complaints at today's visit. She denies chest pain or breathing problems. She states that she does not have any new stressors in her life. She would like to get the flu shot.   Review of Systems  Constitutional: Negative for fever, chills, activity change, fatigue and unexpected weight change.  Respiratory: Negative for choking, chest tightness and wheezing.   Cardiovascular: Negative for chest pain, palpitations and leg swelling.  Gastrointestinal: Negative for nausea, vomiting, abdominal pain, diarrhea and constipation.  Musculoskeletal: Negative.   Skin: Negative.   Neurological: Negative.   Psychiatric/Behavioral: Positive for dysphoric mood. Negative for suicidal ideas, hallucinations, behavioral problems, confusion, sleep disturbance, self-injury, decreased concentration and agitation. The patient is not nervous/anxious and is not hyperactive.        Objective:   Physical Exam  Constitutional: She is oriented to person, place, and time. She appears well-developed and well-nourished.  HENT:  Head: Normocephalic and atraumatic.  Eyes: EOM are normal. Pupils are equal, round, and reactive to light.  Neck: Normal range of motion. Neck supple.  Cardiovascular:  Normal rate and regular rhythm.   No murmur heard. Pulmonary/Chest: Effort normal and breath sounds normal.  Abdominal: Soft. Bowel sounds are normal.  Musculoskeletal: Normal range of motion.  Neurological: She is alert and oriented to person, place, and time.  Skin: Skin is warm and dry.  Psychiatric: She has a normal mood and affect. Her behavior is normal. Judgment and thought content normal.       Assessment/Plan:   1. Please see problem-oriented charting.  2. Disposition-the patient will stop celexa and start prozac 20 mg daily. She will try to bring back papers for [facility]. She will increase lisinopril to 20 mg daily. She was given flu shot. Will be seen back in February.

## 2013-05-16 NOTE — Assessment & Plan Note (Signed)
The patient is not having benefit from celexa and will stop and switch to prozac 20 mg daily. Have advised her that full effect may take 4-6 weeks and if she is not feeling better or if she feels worse to call us back and come back sooner than February.

## 2013-05-18 ENCOUNTER — Ambulatory Visit (INDEPENDENT_AMBULATORY_CARE_PROVIDER_SITE_OTHER): Payer: Self-pay | Admitting: Internal Medicine

## 2013-05-18 ENCOUNTER — Encounter: Payer: Self-pay | Admitting: Internal Medicine

## 2013-05-18 VITALS — BP 142/85 | HR 99 | Temp 99.1°F | Ht 65.0 in | Wt 199.6 lb

## 2013-05-18 DIAGNOSIS — I10 Essential (primary) hypertension: Secondary | ICD-10-CM

## 2013-05-18 DIAGNOSIS — T7840XA Allergy, unspecified, initial encounter: Secondary | ICD-10-CM

## 2013-05-18 DIAGNOSIS — F17200 Nicotine dependence, unspecified, uncomplicated: Secondary | ICD-10-CM

## 2013-05-18 NOTE — Progress Notes (Signed)
HPI The patient is a 47 y.o. female with a history of HTN, tobacco use, depression, presenting for an acute visit for left shoulder pain.  The patient received the flu vaccine 2 days ago in her left shoulder.  Since that time, the patient notes a dull left shoulder pain which developed a few hours later, worse with palpation, then progressed to swelling as well.  She notes associated warmth at the area.  Symptoms have starting to improve.  She has been taking ibuprofen for pain.  No difficulty breathing, tongue/lip swelling, itching, rash, flushing, LH, rhinorrhea, watery eyes.  The patient states she has never taken the flu shot before.  However, she notes that when she has received a TB skin test in the past, she had significant swelling at the site, similar to this.  She notes no allergies to eggs or latex.  The patient is trying to cut back on smoking, now smoking about one half of a pack per day. She is not using any smoking cessation aids.  ROS: General: no fevers, chills, changes in weight, changes in appetite Skin: see HPI HEENT: no blurry vision, hearing changes, sore throat Pulm: no dyspnea, coughing, wheezing CV: no chest pain, palpitations, shortness of breath Abd: no abdominal pain, nausea/vomiting, diarrhea/constipation GU: no dysuria, hematuria, polyuria Ext: see HPI Neuro: no weakness, numbness, or tingling  Filed Vitals:   05/18/13 1334  BP: 142/85  Pulse: 99  Temp: 99.1 F (37.3 C)    PEX General: alert, cooperative, and in no apparent distress HEENT: pupils equal round and reactive to light, vision grossly intact, oropharynx clear and non-erythematous.  No swelling in lips or tongue Neck: supple, no lymphadenopathy Lungs: clear to ascultation bilaterally, normal work of respiration, no wheezes, rales, ronchi Heart: regular rate and rhythm, no murmurs, gallops, or rubs Abdomen: soft, non-tender, non-distended, normal bowel sounds Extremities: left deltoid with a  large area of edema, mild warmth, and mild tenderness to palpation.  No significant erythema, surrounding erythema/skin changes, or drainage. Neurologic: alert & oriented X3, cranial nerves II-XII intact, strength grossly intact, sensation intact to light touch  Current Outpatient Prescriptions on File Prior to Visit  Medication Sig Dispense Refill  . Blood Pressure KIT 1 kit by Does not apply route 2 (two) times daily.  1 each  0  . fluconazole (DIFLUCAN) 150 MG tablet TAKE ONE TABLET BY MOUTH WEEKLY, FOR FLARES TAKE 1 TABLET ONCE DAILY FOR 3 DAYS.  7 tablet  3  . FLUoxetine (PROZAC) 20 MG capsule Take 1 capsule (20 mg total) by mouth daily.  30 capsule  2  . hydrochlorothiazide (HYDRODIURIL) 25 MG tablet TAKE ONE TABLET BY MOUTH EVERY DAY  90 tablet  3  . lisinopril (PRINIVIL,ZESTRIL) 20 MG tablet Take 1 tablet (20 mg total) by mouth daily.  30 tablet  3  . simvastatin (ZOCOR) 40 MG tablet Take 1 tablet (40 mg total) by mouth at bedtime.  30 tablet  6   No current facility-administered medications on file prior to visit.    Assessment/Plan

## 2013-05-18 NOTE — Assessment & Plan Note (Signed)
The patient presents with localized swelling, tenderness of her left shoulder, likely representing a mild focal allergic reaction to her flu vaccine two days ago. The patient has no systemic symptoms, including no airway swelling or tongue swelling. She notes a similar reaction with a TB skin test in the past. Her reaction is likely to one of the preservatives in the vaccine, rather than the virus itself, though she notes no egg or latex allergy. -area improving on its own -can take benadryl, as well as ibuprofen or tylenol for swelling and pain -patient can likely take the flu vaccine in the future, but will likely have this reaction again.  A discussion of risks and benefits is warranted before her next flu shot (benefits will likely increase with age)

## 2013-05-18 NOTE — Patient Instructions (Signed)
General Instructions: The swelling in your arm is likely a small allergic reaction.  This will improve on its own, but you can take Benadryl (25-50 mg every 6 hours as needed) and ibuprofen for your symptoms.  This doesn't mean that you can never get the flu shot again, but you will likely have this reaction again.  If you develop symptoms of airway, tongue, or lip swelling, please seek immediate care.  Quitting smoking is the best thing you can do for your health!  Please review the pamphlet I've given you.  Please return for a follow-up visit in 6 months.   Treatment Goals:  Goals (1 Years of Data) as of 05/18/13         As of Today 05/16/13 05/16/13 04/25/13 03/24/12     Blood Pressure    . Blood Pressure < 140/90  142/85 136/91 150/92 158/85 132/85      Progress Toward Treatment Goals:  Treatment Goal 05/18/2013  Blood pressure at goal  Stop smoking smoking less    Self Care Goals & Plans:  Self Care Goal 05/16/2013  Manage my medications take my medicines as prescribed  Monitor my health keep track of my weight  Eat healthy foods eat foods that are low in salt  Be physically active take a walk every day  Meeting treatment goals maintain the current self-care plan    No flowsheet data found.   Care Management & Community Referrals:  Referral 05/18/2013  Referrals made for care management support none needed

## 2013-05-18 NOTE — Assessment & Plan Note (Signed)
BP Readings from Last 3 Encounters:  05/18/13 142/85  05/16/13 136/91  04/25/13 158/85    Lab Results  Component Value Date   NA 139 04/25/2013   K 3.5 04/25/2013   CREATININE 0.62 04/25/2013    Assessment: Blood pressure control: controlled Progress toward BP goal:  at goal Comments: BP only mildly elevated, in the setting of pain. Will not change medications at this time.  Plan: Medications:  continue current medications Educational resources provided:   Self management tools provided:   Other plans: Recheck at next visit

## 2013-05-18 NOTE — Assessment & Plan Note (Signed)
  Assessment: Progress toward smoking cessation:  smoking less Barriers to progress toward smoking cessation:  withdrawal symptoms Comments: The patient is smoking less on her own, but does not want to try smoking cessation aid at this time.  Plan: Instruction/counseling given:  I counseled patient on the dangers of tobacco use, advised patient to stop smoking, and reviewed strategies to maximize success. Educational resources provided:    Self management tools provided:    Medications to assist with smoking cessation:  None Patient agreed to the following self-care plans for smoking cessation:    Other plans: Reassess at next visit

## 2013-05-20 NOTE — Progress Notes (Signed)
Case discussed with Dr. Brown at the time of the visit.  We reviewed the resident's history and exam and pertinent patient test results.  I agree with the assessment, diagnosis, and plan of care documented in the resident's note. 

## 2013-05-29 ENCOUNTER — Other Ambulatory Visit: Payer: Self-pay | Admitting: Internal Medicine

## 2013-05-30 ENCOUNTER — Ambulatory Visit: Payer: Self-pay

## 2013-05-31 ENCOUNTER — Ambulatory Visit: Payer: Self-pay

## 2013-06-05 ENCOUNTER — Emergency Department (INDEPENDENT_AMBULATORY_CARE_PROVIDER_SITE_OTHER)
Admission: EM | Admit: 2013-06-05 | Discharge: 2013-06-05 | Disposition: A | Discharge status: Home / Self Care | Payer: Self-pay | Source: Home / Self Care | Attending: Emergency Medicine | Admitting: Emergency Medicine

## 2013-06-05 ENCOUNTER — Encounter (HOSPITAL_COMMUNITY): Payer: Self-pay | Admitting: Emergency Medicine

## 2013-06-05 DIAGNOSIS — M461 Sacroiliitis, not elsewhere classified: Secondary | ICD-10-CM

## 2013-06-05 DIAGNOSIS — M47818 Spondylosis without myelopathy or radiculopathy, sacral and sacrococcygeal region: Secondary | ICD-10-CM

## 2013-06-05 LAB — POCT URINALYSIS DIP (DEVICE)
Glucose, UA: NEGATIVE mg/dL
Ketones, ur: NEGATIVE mg/dL
Leukocytes, UA: NEGATIVE
Nitrite: NEGATIVE
Protein, ur: NEGATIVE mg/dL
Urobilinogen, UA: 0.2 mg/dL (ref 0.0–1.0)
pH: 7 (ref 5.0–8.0)

## 2013-06-05 MED ORDER — KETOROLAC TROMETHAMINE 60 MG/2ML IM SOLN
60.0000 mg | Freq: Once | INTRAMUSCULAR | Status: AC
  Administered 2013-06-05: 60 mg via INTRAMUSCULAR

## 2013-06-05 MED ORDER — METHYLPREDNISOLONE ACETATE 80 MG/ML IJ SUSP
80.0000 mg | Freq: Once | INTRAMUSCULAR | Status: AC
  Administered 2013-06-05: 80 mg via INTRAMUSCULAR

## 2013-06-05 MED ORDER — KETOROLAC TROMETHAMINE 60 MG/2ML IM SOLN
INTRAMUSCULAR | Status: AC
  Filled 2013-06-05: qty 2

## 2013-06-05 MED ORDER — PREDNISONE 20 MG PO TABS: ORAL_TABLET | ORAL | Status: DC

## 2013-06-05 MED ORDER — MELOXICAM 15 MG PO TABS: 15.0000 mg | ORAL_TABLET | Freq: Every day | ORAL | Status: DC

## 2013-06-05 MED ORDER — TRAMADOL HCL 50 MG PO TABS: 100.0000 mg | ORAL_TABLET | Freq: Three times a day (TID) | ORAL | Status: DC | PRN

## 2013-06-05 MED ORDER — METHYLPREDNISOLONE ACETATE 80 MG/ML IJ SUSP
INTRAMUSCULAR | Status: AC
  Filled 2013-06-05: qty 1

## 2013-06-05 NOTE — ED Notes (Signed)
C/O low mid-back pain without injury x approx1.5 wks.  Walking is okay, sitting is okay, but going from sitting to standing position is very painful.  Today pain has radiated down bilat legs; denies any parasthesias.  Has tried IBU, Tyl, and heat packs without relief.

## 2013-06-05 NOTE — ED Provider Notes (Signed)
Chief Complaint:   Chief Complaint  Patient presents with  . Back Pain    History of Present Illness:   Ann Stone is a 47 year old female who has had a one half week history of pain in the right lower back area with radiation to the right buttock. She points to the area of the sacroiliac joint. She denies any radiation down the leg, numbness, tingling, or weakness. She has no bladder or bowel dysfunction. No abdominal pain, fever, chills, unintentional weight loss, or other joint pain. The pain is worse if she gets up from a sitting or lying position, and in general seems to be worse after immobility and better with activity. She tried Tylenol, ibuprofen, and heat without much improvement. She has not had back problems before and has no history of a back injury.  Review of Systems:  Other than noted above, the patient denies any of the following symptoms: Systemic:  No fever, chills, severe fatigue, or unexplained weight loss. GI:  No abdominal pain, nausea, vomiting, diarrhea, constipation, incontinence of bowel, or blood in stool. GU:  No dysuria, frequency, urgency, or hematuria. No incontinence of urine or difficulty urinating.  M-S:  No neck pain, joint pain, arthritis, or myalgias. Neuro:  No paresthesias, saddle anesthesia, muscular weakness, or progressive neurological deficit.  PMFSH:  Past medical history, family history, social history, meds, and allergies were reviewed. Specifically, there is no history of cancer, major trauma, osteoporosis, immunosuppression, or HIV infection. She has high blood pressure and takes hydrochlorothiazide, lisinopril, and fluoxetine.  Physical Exam:   Vital signs:  BP 105/65  Pulse 85  Temp(Src) 99.2 F (37.3 C) (Oral)  Resp 16  SpO2 98% General:  Alert, oriented, in no distress. Abdomen:  Soft, non-tender.  No organomegaly or mass.  No pulsatile midline abdominal mass or bruit. Back:  There is tenderness to palpation right over the right  sacroiliac joint. Her back has a fairly good range of motion with pain on movement. Straight leg raising was negative. Faber maneuver was positive. Fadir maneuver was negative. Neuro:  Normal muscle strength, sensations and DTRs. Extremities: Pedal pulses were full, there was no edema. Skin:  Clear, warm and dry.  No rash.  Labs:   Results for orders placed during the hospital encounter of 06/05/13  POCT URINALYSIS DIP (DEVICE)      Result Value Range   Glucose, UA NEGATIVE  NEGATIVE mg/dL   Bilirubin Urine NEGATIVE  NEGATIVE   Ketones, ur NEGATIVE  NEGATIVE mg/dL   Specific Gravity, Urine 1.020  1.005 - 1.030   Hgb urine dipstick NEGATIVE  NEGATIVE   pH 7.0  5.0 - 8.0   Protein, ur NEGATIVE  NEGATIVE mg/dL   Urobilinogen, UA 0.2  0.0 - 1.0 mg/dL   Nitrite NEGATIVE  NEGATIVE   Leukocytes, UA NEGATIVE  NEGATIVE    Course in Urgent Care Center:   A thin Toradol 60 mg IM and Depo-Medrol 80 mg IM.  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  Plan:   1.  Meds:  The following meds were prescribed:   Discharge Medication List as of 06/05/2013  7:22 PM    START taking these medications   Details  meloxicam (MOBIC) 15 MG tablet Take 1 tablet (15 mg total) by mouth daily., Starting 06/05/2013, Until Discontinued, Normal    predniSONE (DELTASONE) 20 MG tablet 3 daily for 5 days, 2 daily for 5 days, 1 daily for 5 days., Normal  traMADol (ULTRAM) 50 MG tablet Take 2 tablets (100 mg total) by mouth every 8 (eight) hours as needed., Starting 06/05/2013, Until Discontinued, Normal        2.  Patient Education/Counseling:  The patient was given appropriate handouts, self care instructions, and instructed in symptomatic relief. The patient was encouraged to try to be as active as possible and given some exercises to do followed by moist heat.  3.  Follow up:  The patient was told to follow up if no better in 3 to 4 days, if becoming worse in any way, and given  some red flag symptoms such as worsening pain or new neurological symptoms which would prompt immediate return.  Follow up with Dr. Trudee Grip if no better in 2 weeks.     Reuben Likes, MD 06/05/13 2121

## 2013-06-06 ENCOUNTER — Telehealth: Payer: Self-pay | Admitting: *Deleted

## 2013-06-06 NOTE — Telephone Encounter (Signed)
Pt states she has 2 new scripts, she was started on prozac and then increased her lisinopril to 20mg , she started these on the same day and has had severe nausea she always takes these in the morning, the nausea goes away at night and is gone when she awakes then she takes the pills and it starts again, please advise. You may call her at [phone]

## 2013-06-07 NOTE — Telephone Encounter (Signed)
She can try moving the prozac to night time. I doubt that it is the lisinopril since she has already been on that.   If she is still having nausea with moving prozac to night time she should call us back.   Thanks,  Dr. Dorise Hiss

## 2013-06-07 NOTE — Telephone Encounter (Signed)
Spoke w/ pt she is agreeable and will call clinic if it doesn't work

## 2013-09-12 ENCOUNTER — Emergency Department (INDEPENDENT_AMBULATORY_CARE_PROVIDER_SITE_OTHER)
Admission: EM | Admit: 2013-09-12 | Discharge: 2013-09-12 | Disposition: A | Discharge status: Home / Self Care | Payer: Self-pay | Source: Home / Self Care | Attending: Emergency Medicine | Admitting: Emergency Medicine

## 2013-09-12 ENCOUNTER — Emergency Department (INDEPENDENT_AMBULATORY_CARE_PROVIDER_SITE_OTHER): Payer: Self-pay

## 2013-09-12 ENCOUNTER — Encounter (HOSPITAL_COMMUNITY): Payer: Self-pay | Admitting: Emergency Medicine

## 2013-09-12 DIAGNOSIS — M7989 Other specified soft tissue disorders: Secondary | ICD-10-CM

## 2013-09-12 DIAGNOSIS — IMO0002 Reserved for concepts with insufficient information to code with codable children: Secondary | ICD-10-CM

## 2013-09-12 DIAGNOSIS — M79643 Pain in unspecified hand: Secondary | ICD-10-CM

## 2013-09-12 DIAGNOSIS — M1710 Unilateral primary osteoarthritis, unspecified knee: Secondary | ICD-10-CM

## 2013-09-12 DIAGNOSIS — M79609 Pain in unspecified limb: Secondary | ICD-10-CM

## 2013-09-12 MED ORDER — NAPROXEN 500 MG PO TABS: 500.0000 mg | ORAL_TABLET | Freq: Two times a day (BID) | ORAL | Status: DC

## 2013-09-12 MED ORDER — TRAMADOL HCL 50 MG PO TABS: 50.0000 mg | ORAL_TABLET | Freq: Four times a day (QID) | ORAL | Status: DC | PRN

## 2013-09-12 NOTE — ED Provider Notes (Signed)
Medical screening examination/treatment/procedure(s) were performed by non-physician practitioner and as supervising physician I was immediately available for consultation/collaboration.  Philipp Deputy, M.D.   Harden Mo, MD 09/12/13 2232

## 2013-09-12 NOTE — ED Notes (Signed)
Pt  Reports pain  /  Swelling  r  Hand    -  He  Reports       She  Woke  Up  With  The  Symptoms           She  denys  Any  Injury

## 2013-09-12 NOTE — Discharge Instructions (Signed)
Musculoskeletal Pain Musculoskeletal pain is muscle and boney aches and pains. These pains can occur in any part of the body. Your caregiver may treat you without knowing the cause of the pain. They may treat you if blood or urine tests, X-rays, and other tests were normal.  CAUSES There is often not a definite cause or reason for these pains. These pains may be caused by a type of germ (virus). The discomfort may also come from overuse. Overuse includes working out too hard when your body is not fit. Boney aches also come from weather changes. Bone is sensitive to atmospheric pressure changes. HOME CARE INSTRUCTIONS   Ask when your test results will be ready. Make sure you get your test results.  Only take over-the-counter or prescription medicines for pain, discomfort, or fever as directed by your caregiver. If you were given medications for your condition, do not drive, operate machinery or power tools, or sign legal documents for 24 hours. Do not drink alcohol. Do not take sleeping pills or other medications that may interfere with treatment.  Continue all activities unless the activities cause more pain. When the pain lessens, slowly resume normal activities. Gradually increase the intensity and duration of the activities or exercise.  During periods of severe pain, bed rest may be helpful. Lay or sit in any position that is comfortable.  Putting ice on the injured area.  Put ice in a bag.  Place a towel between your skin and the bag.  Leave the ice on for 15 to 20 minutes, 3 to 4 times a day.  Follow up with your caregiver for continued problems and no reason can be found for the pain. If the pain becomes worse or does not go away, it may be necessary to repeat tests or do additional testing. Your caregiver may need to look further for a possible cause. SEEK IMMEDIATE MEDICAL CARE IF:  You have pain that is getting worse and is not relieved by medications.  You develop chest pain  that is associated with shortness or breath, sweating, feeling sick to your stomach (nauseous), or throw up (vomit).  Your pain becomes localized to the abdomen.  You develop any new symptoms that seem different or that concern you. MAKE SURE YOU:   Understand these instructions.  Will watch your condition.  Will get help right away if you are not doing well or get worse. Document Released: 06/30/2005 Document Revised: 09/22/2011 Document Reviewed: 03/04/2013 [facility] Patient Information 2014 [place]. RICE: Routine Care for Injuries The routine care of many injuries includes Rest, Ice, Compression, and Elevation (RICE). HOME CARE INSTRUCTIONS  Rest is needed to allow your body to heal. Routine activities can usually be resumed when comfortable. Injured tendons and bones can take up to 6 weeks to heal. Tendons are the cord-like structures that attach muscle to bone.  Ice following an injury helps keep the swelling down and reduces pain.  Put ice in a plastic bag.  Place a towel between your skin and the bag.  Leave the ice on for 15-20 minutes, 03-04 times a day. Do this while awake, for the first 24 to 48 hours. After that, continue as directed by your caregiver.  Compression helps keep swelling down. It also gives support and helps with discomfort. If an elastic bandage has been applied, it should be removed and reapplied every 3 to 4 hours. It should not be applied tightly, but firmly enough to keep swelling down. Watch fingers or toes for  swelling, bluish discoloration, coldness, numbness, or excessive pain. If any of these problems occur, remove the bandage and reapply loosely. Contact your caregiver if these problems continue. °· Elevation helps reduce swelling and decreases pain. With extremities, such as the arms, hands, legs, and feet, the injured area should be placed near or above the level of the heart, if possible. °SEEK IMMEDIATE MEDICAL CARE IF: °· You have  persistent pain and swelling. °· You develop redness, numbness, or unexpected weakness. °· Your symptoms are getting worse rather than improving after several days. °These symptoms may indicate that further evaluation or further X-rays are needed. Sometimes, X-rays may not show a small broken bone (fracture) until 1 week or 10 days later. Make a follow-up appointment with your caregiver. Ask when your X-ray results will be ready. Make sure you get your X-ray results. °Document Released: 10/12/2000 Document Revised: 09/22/2011 Document Reviewed: 11/29/2010 °[employer]® Patient Information ©2014 [employer]. ° °

## 2013-09-12 NOTE — ED Provider Notes (Signed)
CSN: [id number]     Arrival date & time 09/12/13  1023 History   First MD Initiated Contact with Patient 09/12/13 1059     Chief Complaint  Patient presents with  . Hand Problem   (Consider location/radiation/quality/duration/timing/severity/associated sxs/prior Treatment) HPI Comments: 47-year-old female presents for evaluation of right hand pain and swelling. This has been present since she woke up yesterday morning. The pain and swelling are localized along the ulnar side of her hand, worse on the dorsal surface. She denies any injury. The pain and swelling are slightly better today than they were because she applied ice and took ibuprofen. She has never had this happen before. She denies any increased activity with hand. She denies any numbness in the hand. She has no history of DVT or PE and no recent travel. She does not take any hormonal contraceptives.   Past Medical History  Diagnosis Date  . Leiomyoma of uterus     s/p hysterectomy, hx of uterine hemorrhage  . HTN (hypertension)   . Hyperlipemia   . Depression   . Tobacco abuse   . GERD (gastroesophageal reflux disease)    Past Surgical History  Procedure Laterality Date  . Operative hysteroscopy    . Abdominal hysterectomy     History reviewed. No pertinent family history. History  Substance Use Topics  . Smoking status: Current Every Day Smoker -- 1.00 packs/day    Types: Cigarettes  . Smokeless tobacco: Not on file     Comment: PATIENT CUTTING BACK   . Alcohol Use: Yes     Comment: occasional   OB History   Grav Para Term Preterm Abortions TAB SAB Ect Mult Living                 Review of Systems  Constitutional: Negative for fever and chills.  Eyes: Negative for visual disturbance.  Respiratory: Negative for cough and shortness of breath.   Cardiovascular: Negative for chest pain, palpitations and leg swelling.  Gastrointestinal: Negative for nausea, vomiting and abdominal pain.  Endocrine: Negative for  polydipsia and polyuria.  Genitourinary: Negative for dysuria, urgency and frequency.  Musculoskeletal:       See history of present illness  Skin: Negative for rash.  Neurological: Negative for dizziness, weakness and light-headedness.    Allergies  Metoprolol tartrate  Home Medications   Current Outpatient Rx  Name  Route  Sig  Dispense  Refill  . Blood Pressure KIT   Does not apply   1 kit by Does not apply route 2 (two) times daily.   1 each   0   . fluconazole (DIFLUCAN) 150 MG tablet      TAKE ONE TABLET BY MOUTH WEEKLY, FOR FLARES TAKE 1 TABLET ONCE DAILY FOR 3 DAYS.   7 tablet   3   . fluconazole (DIFLUCAN) 150 MG tablet      TAKE ONE TABLET BY MOUTH ONCE A WEEK, FOR FLARES TAKE 1 TABLET ONCE DAILY FOR 3 DAYS   7 tablet   4   . FLUoxetine (PROZAC) 20 MG capsule   Oral   Take 1 capsule (20 mg total) by mouth daily.   30 capsule   2   . hydrochlorothiazide (HYDRODIURIL) 25 MG tablet      TAKE ONE TABLET BY MOUTH EVERY DAY   90 tablet   3   . lisinopril (PRINIVIL,ZESTRIL) 20 MG tablet   Oral   Take 1 tablet (20 mg total) by mouth daily.     30 tablet   3   . meloxicam (MOBIC) 15 MG tablet   Oral   Take 1 tablet (15 mg total) by mouth daily.   15 tablet   0   . naproxen (NAPROSYN) 500 MG tablet   Oral   Take 1 tablet (500 mg total) by mouth 2 (two) times daily.   30 tablet   0   . predniSONE (DELTASONE) 20 MG tablet      3 daily for 5 days, 2 daily for 5 days, 1 daily for 5 days.   30 tablet   0   . simvastatin (ZOCOR) 40 MG tablet   Oral   Take 1 tablet (40 mg total) by mouth at bedtime.   30 tablet   6   . traMADol (ULTRAM) 50 MG tablet   Oral   Take 2 tablets (100 mg total) by mouth every 8 (eight) hours as needed.   30 tablet   0   . traMADol (ULTRAM) 50 MG tablet   Oral   Take 1 tablet (50 mg total) by mouth every 6 (six) hours as needed.   15 tablet   0    BP 142/86  Pulse 72  Temp(Src) 98.6 F (37 C) (Oral)  Resp 16   SpO2 100% Physical Exam  Nursing note and vitals reviewed. Constitutional: She is oriented to person, place, and time. Vital signs are normal. She appears well-developed and well-nourished. No distress.  HENT:  Head: Normocephalic and atraumatic.  Cardiovascular: Normal rate.   Pulmonary/Chest: Effort normal. No respiratory distress.  Musculoskeletal:       Right hand: She exhibits tenderness (Over the fourth and fifth metacarpals, more tender on the dorsum) and bony tenderness. She exhibits normal range of motion (Pain over 4th and 5th difits, dorsal hand, with flexion and extension of the fourth and fifth digits against resistance, extension is worse than flexion. ) and normal capillary refill. Normal sensation noted. Normal strength noted.  Neurological: She is alert and oriented to person, place, and time. She has normal strength. Coordination normal.  Skin: Skin is warm and dry. No rash noted. She is not diaphoretic.  Psychiatric: She has a normal mood and affect. Judgment normal.    ED Course  Procedures (including critical care time) Labs Review Labs Reviewed - No data to display Imaging Review Dg Hand Complete Right  09/12/2013   CLINICAL DATA:  Right hand swelling.  No injury.  EXAM: RIGHT HAND - COMPLETE 3+ VIEW  COMPARISON:  None.  FINDINGS: There is no evidence of fracture or dislocation. There is no evidence of arthropathy or other focal bone abnormality. Mild diffuse soft tissue swelling within the hand. No radiopaque foreign bodies.  IMPRESSION: No acute bony abnormality.   Electronically Signed   By: Rolm Baptise M.D.   On: 09/12/2013 11:57     MDM   1. Hand pain   2. Hand swelling    MSK pain.  Treat with NSAIDs and tramadol, f/u if not improving    Meds ordered this encounter  Medications  . naproxen (NAPROSYN) 500 MG tablet    Sig: Take 1 tablet (500 mg total) by mouth 2 (two) times daily.    Dispense:  30 tablet    Refill:  0    Order Specific Question:   Supervising Provider    Answer:  Lynne Leader, [place]  . traMADol (ULTRAM) 50 MG tablet    Sig: Take 1 tablet (50 mg total) by mouth every  6 (six) hours as needed.    Dispense:  15 tablet    Refill:  0    Order Specific Question:  Supervising Provider    Answer:  COREY, EVAN, S [[id number]]       Zachary H Baker, PA-C 09/12/13 1218 

## 2013-09-27 ENCOUNTER — Other Ambulatory Visit: Payer: Self-pay | Admitting: Internal Medicine

## 2013-10-17 ENCOUNTER — Other Ambulatory Visit: Payer: Self-pay | Admitting: Internal Medicine

## 2013-10-18 NOTE — Telephone Encounter (Signed)
Will allow 1 month supply but she needs visit before further refills as she was supposed to return for lab work in December.   Dr. Doug Sou

## 2013-10-18 NOTE — Telephone Encounter (Signed)
Request sent to front desk

## 2013-10-20 ENCOUNTER — Encounter: Payer: Self-pay | Admitting: Internal Medicine

## 2013-12-02 ENCOUNTER — Encounter: Payer: Self-pay | Admitting: Internal Medicine

## 2013-12-02 ENCOUNTER — Ambulatory Visit (INDEPENDENT_AMBULATORY_CARE_PROVIDER_SITE_OTHER): Payer: Self-pay | Admitting: Internal Medicine

## 2013-12-02 VITALS — BP 157/91 | HR 86 | Temp 97.9°F | Ht 65.0 in | Wt 198.7 lb

## 2013-12-02 DIAGNOSIS — F3289 Other specified depressive episodes: Secondary | ICD-10-CM

## 2013-12-02 DIAGNOSIS — F329 Major depressive disorder, single episode, unspecified: Secondary | ICD-10-CM

## 2013-12-02 DIAGNOSIS — F17200 Nicotine dependence, unspecified, uncomplicated: Secondary | ICD-10-CM

## 2013-12-02 DIAGNOSIS — I10 Essential (primary) hypertension: Secondary | ICD-10-CM

## 2013-12-02 MED ORDER — FLUOXETINE HCL 20 MG PO CAPS: 20.0000 mg | ORAL_CAPSULE | Freq: Every day | ORAL | Status: DC

## 2013-12-02 MED ORDER — FLUCONAZOLE 150 MG PO TABS: ORAL_TABLET | ORAL | Status: DC

## 2013-12-02 MED ORDER — HYDROCHLOROTHIAZIDE 25 MG PO TABS: ORAL_TABLET | ORAL | Status: DC

## 2013-12-02 MED ORDER — LORATADINE 10 MG PO CAPS: 10.0000 mg | ORAL_CAPSULE | Freq: Every day | ORAL | Status: DC

## 2013-12-02 MED ORDER — LISINOPRIL 20 MG PO TABS
20.0000 mg | ORAL_TABLET | Freq: Every day | ORAL | Status: DC
Start: 2013-12-02 — End: 2014-11-23

## 2013-12-02 MED ORDER — TRAMADOL HCL 50 MG PO TABS: 100.0000 mg | ORAL_TABLET | Freq: Three times a day (TID) | ORAL | Status: DC | PRN

## 2013-12-02 NOTE — Progress Notes (Signed)
Subjective:     Patient ID: Ann Stone, female   DOB: March 23, 1966, 48 y.o.   MRN: [id number]  HPI The patient is a 48 year old female who comes in today for a regular checkup visit. She has been continuing to have a lot of stress in her life and has been out of some of her medications for the last 3-4 days. She has been taking some OTC cold medications as well for some sinus congestion. She denes headache, chills, fevers. She is having some drainage and pressure. She is doing better with prozac although she is still having a lot of stress. She is trying to walk and decrease her weight. She denies SI/HI. She has significant past medical history of hyperlipidemia, smoking, hypertension. No other complaints at today's visit. She denies chest pain or breathing problems.   Review of Systems  Constitutional: Negative for fever, chills, activity change, fatigue and unexpected weight change.  HENT: Positive for congestion and sinus pressure.   Respiratory: Negative for choking, chest tightness and wheezing.   Cardiovascular: Negative for chest pain, palpitations and leg swelling.  Gastrointestinal: Negative for nausea, vomiting, abdominal pain, diarrhea and constipation.  Musculoskeletal: Negative.   Skin: Negative.   Neurological: Negative.  Negative for headaches.  Psychiatric/Behavioral: Positive for dysphoric mood. Negative for suicidal ideas, hallucinations, behavioral problems, confusion, sleep disturbance, self-injury, decreased concentration and agitation. The patient is not nervous/anxious and is not hyperactive.        Objective:   Physical Exam  Constitutional: She is oriented to person, place, and time. She appears well-developed and well-nourished.  HENT:  Head: Normocephalic and atraumatic.  Right Ear: External ear normal.  Left Ear: External ear normal.  Nose: Nose normal.  Mouth/Throat: Oropharynx is clear and moist.  Some clear drainage at the back of the throat without  erythema.   Eyes: EOM are normal. Pupils are equal, round, and reactive to light.  Neck: Normal range of motion. Neck supple.  Cardiovascular: Normal rate and regular rhythm.   No murmur heard. Pulmonary/Chest: Effort normal and breath sounds normal.  Abdominal: Soft. Bowel sounds are normal.  Musculoskeletal: Normal range of motion.  Neurological: She is alert and oriented to person, place, and time.  Skin: Skin is warm and dry.  Psychiatric: She has a normal mood and affect. Her behavior is normal. Judgment and thought content normal.       Assessment/Stone:   1. Please see problem-oriented charting.  2. Disposition-Will check BMP today. Will be seen back in 3 months or so for BP check. She will continue to work on weight loss and exercise. She will trial claritin for her allergies.

## 2013-12-02 NOTE — Assessment & Plan Note (Signed)
Patient is doing much better with the prozac and is working on walking to help reduce stress and weight.

## 2013-12-02 NOTE — Assessment & Plan Note (Signed)
BP Readings from Last 3 Encounters:  12/02/13 157/91  09/12/13 142/86  06/05/13 105/65    Lab Results  Component Value Date   NA 139 04/25/2013   K 3.5 04/25/2013   CREATININE 0.62 04/25/2013    Assessment: Blood pressure control: mildly elevated Progress toward BP goal:  unchanged Comments:   Plan: Medications:  continue current medications, HCTZ 25 mg daily, lisinopril 20 mg daily Educational resources provided: brochure;handout;video Self management tools provided: instructions for home blood pressure monitoring Other plans: check BMP today

## 2013-12-02 NOTE — Assessment & Plan Note (Signed)
Given current levels of stress in her life she would rather not quit at this time but she will continue to think about it.

## 2013-12-02 NOTE — Patient Instructions (Signed)
We have refilled your medicines today and given you a medicine for your allergies. The medicine for allergies is called claritin (also called loratidine). It should be $4 at [facility] for 1 month.   We will check on your kidneys today and call you if there are any problems with your blood.   Come back in about 3 months to check on your blood pressure. Call the clinic if you are feeling bad before then at [phone].

## 2013-12-03 LAB — BASIC METABOLIC PANEL WITH GFR
BUN: 5 mg/dL — AB (ref 6–23)
CHLORIDE: 100 meq/L (ref 96–112)
CO2: 32 mEq/L (ref 19–32)
Calcium: 9.3 mg/dL (ref 8.4–10.5)
Creat: 0.61 mg/dL (ref 0.50–1.10)
GFR, Est African American: >89 mL/min
GLUCOSE: 84 mg/dL (ref 70–99)
Potassium: 3.7 mEq/L (ref 3.5–5.3)
SODIUM: 142 meq/L (ref 135–145)

## 2013-12-10 NOTE — Progress Notes (Signed)
Case discussed with Dr. Doug Sou soon after the resident saw the patient.  We reviewed the resident's history and exam and pertinent patient test results.  I agree with the assessment, diagnosis, and plan of care documented in the resident's note.  The decision not to escalate her antihypertensive regimen was made under the assumption that her currently mildly elevated blood pressure may have been secondary to the cold remedies that she has been using recently.  The blood pressure will be reassessed at the follow-up visit.

## 2014-05-10 ENCOUNTER — Ambulatory Visit: Payer: Self-pay | Admitting: Internal Medicine

## 2014-05-16 ENCOUNTER — Ambulatory Visit (INDEPENDENT_AMBULATORY_CARE_PROVIDER_SITE_OTHER): Payer: Self-pay | Admitting: Internal Medicine

## 2014-05-16 ENCOUNTER — Encounter: Payer: Self-pay | Admitting: Internal Medicine

## 2014-05-16 VITALS — BP 131/76 | HR 96 | Temp 98.5°F | Ht 65.0 in | Wt 205.0 lb

## 2014-05-16 DIAGNOSIS — N760 Acute vaginitis: Secondary | ICD-10-CM

## 2014-05-16 DIAGNOSIS — B9689 Other specified bacterial agents as the cause of diseases classified elsewhere: Secondary | ICD-10-CM | POA: Insufficient documentation

## 2014-05-16 DIAGNOSIS — I10 Essential (primary) hypertension: Secondary | ICD-10-CM

## 2014-05-16 DIAGNOSIS — A499 Bacterial infection, unspecified: Secondary | ICD-10-CM

## 2014-05-16 MED ORDER — METRONIDAZOLE 0.75 % VA GEL: 1.0000 | Freq: Two times a day (BID) | VAGINAL | Status: DC

## 2014-05-16 NOTE — Assessment & Plan Note (Signed)
BP Readings from Last 3 Encounters:  05/16/14 131/76  12/02/13 157/91  09/12/13 142/86    Lab Results  Component Value Date   NA 142 12/02/2013   K 3.7 12/02/2013   CREATININE 0.61 12/02/2013    Assessment: Blood pressure control: controlled Progress toward BP goal:  at goal Comments: well controlled today at acute care visit  Plan: Medications:  continue current medications HCZ 25 mg daily and lisinopril 20 mg daily Educational resources provided:   Self management tools provided:   Other plans: none

## 2014-05-16 NOTE — Addendum Note (Signed)
Addended by: Orson Gear on: 05/16/2014 01:29 PM   Modules accepted: Orders

## 2014-05-16 NOTE — Progress Notes (Signed)
   Subjective:    Patient ID: Ann Stone, female    DOB: 1965-09-22, 48 y.o.   MRN: [id number]  HPI  Ann Stone is a 48 year old woman s/p hysterectomy 2/2 fibroid bleeds w HTN, HL who presents for suspected bacterial vaginosis (BV). 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 She originally would treat it with flagyl pills but then was switched to flagyl gel which she thinks is more effective. This caused candidiasis and she now takes diflucan along with her flagyl. She went to fill her flagyl gel rx and was told at the pharmacy that she no longer had this listed as a medication. She says she has had white milky discharge x 1 week with associated itching and a fishy odor. She has had the same one sexual partner x 2 years and uses condoms. She denies any history of STD other than BV. She denies any vaginal bleeding, vaginal pain, dysuria, pain with sex. She otherwise feels in good health with no complaints.  Review of Systems  Constitutional: Negative for fever, chills and diaphoresis.  Respiratory: Negative for shortness of breath.   Cardiovascular: Negative for chest pain.  Gastrointestinal: Negative for nausea, vomiting, abdominal pain and rectal pain.  Genitourinary: Positive for vaginal discharge. Negative for dysuria, vaginal bleeding, vaginal pain, pelvic pain and dyspareunia.       Objective:   Physical Exam  Constitutional: She appears well-developed and well-nourished. No distress.  HENT:  Mouth/Throat: Oropharynx is clear and moist.  Eyes: EOM are normal. Pupils are equal, round, and reactive to light.  Cardiovascular: Normal rate, regular rhythm, normal heart sounds and intact distal pulses.  Exam reveals no gallop and no friction rub.   No murmur heard. Pulmonary/Chest: Effort normal and breath sounds normal. No respiratory distress. She has no wheezes.  Abdominal: Soft. Bowel sounds are normal. She exhibits no distension. There is no tenderness.  Well healed vertical midline  incision 2/2 hysterectomy  Genitourinary: No erythema, tenderness or bleeding in the vagina. No foreign body around the vagina. No signs of injury around the vagina. Vaginal discharge found.  No cervix visualized 2/2 hysterectomy. White discharge throughout vaginal canal  Skin: She is not diaphoretic.  Vitals reviewed.         Assessment & Stone:

## 2014-05-16 NOTE — Assessment & Plan Note (Signed)
A: See HPI. 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 There is the characteristic milky white appearance with fishy odor. She has benefited from flagyl gel in the past and we will give her a prescription for flagyl gel. She is active with 1 sexual partner x 2 years and uses condoms. We will still do GC/chlamydia and full wet prep for other STD as well as HIV. -f/u GC/chlamydia and wet prep results -will call patient and provide update if additional diagnoses are made -metronidazole vaginal gel 0.75% 70 g prescription provided

## 2014-05-16 NOTE — Patient Instructions (Signed)
It was a pleasure to see you today. We have given you a prescription for flagyl vaginal gel for your bacterial vaginosis (BV).  Please return to clinic or seek medical attention if you have any new or worsening vaginal discharge, vaginal pain, or other worrisome medical condition. We look forward to seeing you again as needed.  Lottie Mussel, MD  General Instructions:   Please bring your medicines with you each time you come to clinic.  Medicines may include prescription medications, over-the-counter medications, herbal remedies, eye drops, vitamins, or other pills.   Progress Toward Treatment Goals:  Treatment Goal 12/02/2013  Blood pressure unchanged  Stop smoking smoking the same amount    Self Care Goals & Plans:  Self Care Goal 05/16/2014  Manage my medications take my medicines as prescribed; bring my medications to every visit; refill my medications on time  Monitor my health -  Eat healthy foods eat more vegetables; eat foods that are low in salt; eat baked foods instead of fried foods  Be physically active -  Stop smoking cut down the number of cigarettes smoked  Meeting treatment goals -    No flowsheet data found.   Care Management & Community Referrals:  Referral 12/02/2013  Referrals made for care management support none needed

## 2014-05-21 NOTE — Progress Notes (Signed)
Internal Medicine Clinic Attending  I saw and evaluated the patient.  I personally confirmed the key portions of the history and exam documented by Dr. Ethelene Hal and I reviewed pertinent patient test results.  The assessment, diagnosis, and plan were formulated together and I agree with the documentation in the resident's note.  I was also present for Pelvic Exam.

## 2014-05-30 LAB — CERVICOVAGINAL ANCILLARY ONLY
Chlamydia: NEGATIVE
Neisseria Gonorrhea: NEGATIVE
WET PREP (BD AFFIRM): NEGATIVE
WET PREP (BD AFFIRM): POSITIVE — AB
Wet Prep (BD Affirm): NEGATIVE

## 2014-10-03 ENCOUNTER — Emergency Department (INDEPENDENT_AMBULATORY_CARE_PROVIDER_SITE_OTHER)
Admission: EM | Admit: 2014-10-03 | Discharge: 2014-10-03 | Disposition: A | Discharge status: Home / Self Care | Payer: Self-pay | Source: Home / Self Care | Attending: Family Medicine | Admitting: Family Medicine

## 2014-10-03 ENCOUNTER — Encounter (HOSPITAL_COMMUNITY): Payer: Self-pay | Admitting: Emergency Medicine

## 2014-10-03 ENCOUNTER — Emergency Department (INDEPENDENT_AMBULATORY_CARE_PROVIDER_SITE_OTHER): Payer: Self-pay

## 2014-10-03 DIAGNOSIS — M654 Radial styloid tenosynovitis [de Quervain]: Secondary | ICD-10-CM

## 2014-10-03 MED ORDER — HYDROCODONE-ACETAMINOPHEN 5-325 MG PO TABS: 1.0000 | ORAL_TABLET | Freq: Four times a day (QID) | ORAL | Status: DC | PRN

## 2014-10-03 MED ORDER — MELOXICAM 15 MG PO TABS: 15.0000 mg | ORAL_TABLET | Freq: Every day | ORAL | Status: DC

## 2014-10-03 NOTE — ED Notes (Signed)
Patient c/o left wrist pain and lower back pain onset yesterday. Patient reports she has been seen and treated for sciatica but she does not have any more pain medication. She reports she has used a wrist brace and has had no relief. Patient is in NAD.

## 2014-10-03 NOTE — ED Provider Notes (Signed)
CSN: [id number]     Arrival date & time 10/03/14  1826 History   First MD Initiated Contact with Patient 10/03/14 1915     Chief Complaint  Patient presents with  . Wrist Pain  . Sciatica   (Consider location/radiation/quality/duration/timing/severity/associated sxs/prior Treatment) HPI        49 year old female presents complaining of left wrist pain. She has had worsening left wrist pain for the past 3 days. She did not have any injury and no increased level of activity. She has never had this pain before. She has some subjective swelling. Pain is worse with flexion or extension of the wrist. No other injuries. She also complains of chronic back pain. No recent change in her pain. She has no history of inflammatory arthritis, she has family history of rheumatoid arthritis   Past Medical History  Diagnosis Date  . Leiomyoma of uterus     s/p hysterectomy, hx of uterine hemorrhage  . HTN (hypertension)   . Hyperlipemia   . Depression   . Tobacco abuse   . GERD (gastroesophageal reflux disease)    Past Surgical History  Procedure Laterality Date  . Operative hysteroscopy    . Abdominal hysterectomy     No family history on file. History  Substance Use Topics  . Smoking status: Current Every Day Smoker -- 1.00 packs/day    Types: Cigarettes  . Smokeless tobacco: Not on file     Comment: PATIENT CUTTING BACK   . Alcohol Use: 0.0 oz/week    0 Standard drinks or equivalent per week     Comment: occasional   OB History    No data available     Review of Systems  Musculoskeletal: Positive for arthralgias.  All other systems reviewed and are negative.   Allergies  Metoprolol tartrate  Home Medications   Prior to Admission medications   Medication Sig Start Date End Date Taking? Authorizing Provider  fluconazole (DIFLUCAN) 150 MG tablet TAKE ONE TABLET BY MOUTH WEEKLY, FOR FLARES TAKE 1 TABLET ONCE DAILY FOR 3 DAYS. 12/02/13   Olga Millers, MD  FLUoxetine (PROZAC)  20 MG capsule Take 1 capsule (20 mg total) by mouth daily. 12/02/13   Olga Millers, MD  hydrochlorothiazide (HYDRODIURIL) 25 MG tablet TAKE ONE TABLET BY MOUTH EVERY DAY 12/02/13   Olga Millers, MD  HYDROcodone-acetaminophen [facility]) 5-325 MG per tablet Take 1 tablet by mouth every 6 (six) hours as needed for moderate pain. 10/03/14   Freeman Caldron Eleyna Brugh, PA-C  lisinopril (PRINIVIL,ZESTRIL) 20 MG tablet Take 1 tablet (20 mg total) by mouth daily. 12/02/13   Olga Millers, MD  Loratadine 10 MG CAPS Take 1 capsule (10 mg total) by mouth daily. 12/02/13   Olga Millers, MD  meloxicam (MOBIC) 15 MG tablet Take 1 tablet (15 mg total) by mouth daily. 10/03/14   Freeman Caldron Kharter Sestak, PA-C  metroNIDAZOLE (METROGEL) 0.75 % vaginal gel Place 1 Applicatorful vaginally 2 (two) times daily. 05/16/14   Kelby Aline, MD  simvastatin (ZOCOR) 40 MG tablet Take 1 tablet (40 mg total) by mouth at bedtime. 04/25/13   Jessee Avers, MD  traMADol (ULTRAM) 50 MG tablet Take 2 tablets (100 mg total) by mouth every 8 (eight) hours as needed. 12/02/13   Olga Millers, MD   BP 152/86 mmHg  Pulse 113  Temp(Src) 99.9 F (37.7 C) (Oral)  Resp 20  SpO2 98% Physical Exam  Constitutional: She is oriented to person, place, and time. Vital  signs are normal. She appears well-developed and well-nourished. No distress.  HENT:  Head: Normocephalic and atraumatic.  Cardiovascular:  Pulses:      Radial pulses are 2+ on the left side.  Pulmonary/Chest: Effort normal. No respiratory distress.  Musculoskeletal:       Left wrist: She exhibits tenderness (tenderness on the dorsum of the wrist and around the anatomic snuffbox.). She exhibits normal range of motion, no swelling and no effusion.  Positive Finkelstein's test  Neurological: She is alert and oriented to person, place, and time. She has normal strength. No sensory deficit. Coordination normal.  Skin: Skin is warm and dry. No rash noted. She is not diaphoretic.   Psychiatric: She has a normal mood and affect. Judgment normal.  Nursing note and vitals reviewed.   ED Course  Procedures (including critical care time) Labs Review Labs Reviewed - No data to display  Imaging Review Dg Wrist Complete Left  10/03/2014   CLINICAL DATA:  Left wrist swelling and pain.  EXAM: LEFT WRIST - COMPLETE 3+ VIEW  COMPARISON:  None.  FINDINGS: There is no evidence of fracture or dislocation. There is no evidence of arthropathy or other focal bone abnormality. Soft tissues are unremarkable.  IMPRESSION: Negative.   Electronically Signed   By: Kerby Moors M.D.   On: 10/03/2014 19:46     MDM   1. Tennis Must Quervain's tenosynovitis, left    The x-ray is normal. Her exam is consistent with decreased has an otitis. Treat with ice, thumb spica splint, daily meloxicam, when necessary Norco. Follow-up with orthopedics if worsening or if no improvement with treatment  Meds ordered this encounter  Medications  . meloxicam (MOBIC) 15 MG tablet    Sig: Take 1 tablet (15 mg total) by mouth daily.    Dispense:  30 tablet    Refill:  0    Order Specific Question:  Supervising Provider    Answer:  Billy Fischer [phone]  . HYDROcodone-acetaminophen (NORCO) 5-325 MG per tablet    Sig: Take 1 tablet by mouth every 6 (six) hours as needed for moderate pain.    Dispense:  10 tablet    Refill:  0    Order Specific Question:  Supervising Provider    Answer:  Ihor Gully D [place] Tamantha Saline, PA-C 10/03/14 2007

## 2014-10-03 NOTE — Discharge Instructions (Signed)
De Quervain's Tenosynovitis De Quervain's tenosynovitis involves inflammation of one or two tendon linings (sheaths) or strain of one or two tendons to the thumb: extensor pollicis brevis (EPB), or abductor pollicis longus (APL). This causes pain on the side of the wrist and base of the thumb. Tendon sheaths secrete a fluid that lubricates the tendon, allowing the tendon to move smoothly. When the sheath becomes inflamed, the tendon cannot move freely in the sheath. Both the EPB and APL tendons are important for proper use of the hand. The EPB tendon is important for straightening the thumb. The APL tendon is important for moving the thumb away from the index finger (abducting). The two tendons pass through a small tube (canal) in the wrist, near the base of the thumb. When the tendons become inflamed, pain is usually felt in this area. SYMPTOMS   Pain, tenderness, swelling, warmth, or redness over the base of the thumb and thumb side of the wrist.  Pain that gets worse when straightening the thumb.  Pain that gets worse when moving the thumb away from the index finger, against resistance.  Pain with pinching or gripping.  Locking or catching of the thumb.  Limited motion of the thumb.  Crackling sound (crepitation) when the tendon or thumb is moved or touched.  Fluid-filled cyst in the area of the base of the thumb. CAUSES   Tenosynovitis is often linked with overuse of the wrist.  Tenosynovitis may be caused by repeated injury to the thumb muscle and tendon units, and with repeated motions of the hand and wrist, due to friction of the tendon within the lining (sheath).  Tenosynovitis may also be due to a sudden increase in activity or change in activity. RISK INCREASES WITH:  Sports that involve repeated hand and wrist motions (golf, bowling, tennis, squash, racquetball).  Heavy labor.  Poor physical wrist strength and flexibility.  Failure to warm up properly before practice or  play.  Female gender.  New mothers who hold their baby's head for long periods or lift infants with thumbs in the infant's armpit (axilla). PREVENTION  Warm up and stretch properly before practice or competition.  Allow enough time for rest and recovery between practices and competition.  Maintain appropriate conditioning:  Cardiovascular fitness.  Forearm, wrist, and hand flexibility.  Muscle strength and endurance.  Use proper exercise technique. PROGNOSIS  This condition is usually curable within 6 weeks, if treated properly with non-surgical treatment and resting of the affected area.  RELATED COMPLICATIONS   Longer healing time if not properly treated or if not given enough time to heal.  Chronic inflammation, causing recurring symptoms of tenosynovitis. Permanent pain or restriction of movement.  Risks of surgery: infection, bleeding, injury to nerves (numbness of the thumb), continued pain, incomplete release of the tendon sheath, recurring symptoms, cutting of the tendons, tendons sliding out of position, weakness of the thumb, thumb stiffness. TREATMENT  First, treatment involves the use of medicine and ice, to reduce pain and inflammation. Patients are encouraged to stop or modify activities that aggravate the injury. Stretching and strengthening exercises may be advised. Exercises may be completed at home or with a therapist. You may be fitted with a brace or splint, to limit motion and allow the injury to heal. Your caregiver may also choose to give you a corticosteroid injection, to reduce the pain and inflammation. If non-surgical treatment is not successful, surgery may be needed. Most tenosynovitis surgeries are done as outpatient procedures (you go home the   same day). Surgery may involve local, regional (whole arm), or general anesthesia.  MEDICATION   If pain medicine is needed, nonsteroidal anti-inflammatory medicines (aspirin and ibuprofen), or other minor pain  relievers (acetaminophen), are often advised.  Do not take pain medicine for 7 days before surgery.  Prescription pain relievers are often prescribed only after surgery. Use only as directed and only as much as you need.  Corticosteroid injections may be given if your caregiver thinks they are needed. There is a limited number of times these injections may be given. COLD THERAPY   Cold treatment (icing) should be applied for 10 to 15 minutes every 2 to 3 hours for inflammation and pain, and immediately after activity that aggravates your symptoms. Use ice packs or an ice massage. SEEK MEDICAL CARE IF:   Symptoms get worse or do not improve in 2 to 4 weeks, despite treatment.  You experience pain, numbness, or coldness in the hand.  Blue, gray, or dark color appears in the fingernails.  Any of the following occur after surgery: increased pain, swelling, redness, drainage of fluids, bleeding in the affected area, or signs of infection.  New, unexplained symptoms develop. (Drugs used in treatment may produce side effects.) Document Released: 06/30/2005 Document Revised: 09/22/2011 Document Reviewed: 10/12/2008 Exit[employer] Patient Information 2015 [employer]. This information is not intended to replace advice given to you by your health care provider. Make sure you discuss any questions you have with your health care provider.   

## 2014-11-13 ENCOUNTER — Encounter: Payer: Self-pay | Admitting: *Deleted

## 2014-11-23 ENCOUNTER — Other Ambulatory Visit: Payer: Self-pay | Admitting: *Deleted

## 2014-11-23 DIAGNOSIS — I10 Essential (primary) hypertension: Secondary | ICD-10-CM

## 2014-11-24 MED ORDER — HYDROCHLOROTHIAZIDE 25 MG PO TABS: ORAL_TABLET | ORAL | Status: DC

## 2014-11-24 MED ORDER — LISINOPRIL 20 MG PO TABS: 20.0000 mg | ORAL_TABLET | Freq: Every day | ORAL | Status: DC

## 2015-02-05 ENCOUNTER — Other Ambulatory Visit: Payer: Self-pay | Admitting: Internal Medicine

## 2015-02-05 MED ORDER — FLUOXETINE HCL 20 MG PO CAPS: 20.0000 mg | ORAL_CAPSULE | Freq: Every day | ORAL | Status: DC

## 2015-02-05 NOTE — Telephone Encounter (Signed)
She has been on the fluconazole "prn" for many years, but never addressed in MD notes.  This should be reassessed by her PCP.  She needs an appointment prior to refill.

## 2015-02-05 NOTE — Telephone Encounter (Signed)
[facility] [address].

## 2015-02-06 NOTE — Telephone Encounter (Signed)
Pt informed

## 2015-02-12 ENCOUNTER — Ambulatory Visit: Payer: Self-pay | Admitting: Internal Medicine

## 2015-05-03 ENCOUNTER — Ambulatory Visit: Payer: Self-pay | Admitting: Internal Medicine

## 2015-05-08 ENCOUNTER — Encounter: Payer: Self-pay | Admitting: Internal Medicine

## 2015-05-08 ENCOUNTER — Ambulatory Visit (INDEPENDENT_AMBULATORY_CARE_PROVIDER_SITE_OTHER): Payer: Self-pay | Admitting: Internal Medicine

## 2015-05-08 VITALS — BP 153/82 | HR 96 | Temp 98.3°F | Ht 65.0 in | Wt 217.8 lb

## 2015-05-08 DIAGNOSIS — B9689 Other specified bacterial agents as the cause of diseases classified elsewhere: Secondary | ICD-10-CM

## 2015-05-08 DIAGNOSIS — F32A Depression, unspecified: Secondary | ICD-10-CM

## 2015-05-08 DIAGNOSIS — I10 Essential (primary) hypertension: Secondary | ICD-10-CM

## 2015-05-08 DIAGNOSIS — N760 Acute vaginitis: Secondary | ICD-10-CM

## 2015-05-08 DIAGNOSIS — Z09 Encounter for follow-up examination after completed treatment for conditions other than malignant neoplasm: Secondary | ICD-10-CM

## 2015-05-08 DIAGNOSIS — E785 Hyperlipidemia, unspecified: Secondary | ICD-10-CM

## 2015-05-08 DIAGNOSIS — F329 Major depressive disorder, single episode, unspecified: Secondary | ICD-10-CM

## 2015-05-08 MED ORDER — LISINOPRIL 40 MG PO TABS: 40.0000 mg | ORAL_TABLET | Freq: Every day | ORAL | Status: DC

## 2015-05-08 NOTE — Patient Instructions (Signed)
Ms. Solem it was nice meeting you today.  -I have increased the dose of Lisinopril to 40 mg daily  -Continue taking Hydrochlorothiazide 25 mg daily   -Please return for a follow-up visit in 1 month.

## 2015-05-09 LAB — BMP8+ANION GAP
Anion Gap: 16 mmol/L (ref 10.0–18.0)
BUN/Creatinine Ratio: 10 (ref 9–23)
BUN: 7 mg/dL (ref 6–24)
CALCIUM: 9 mg/dL (ref 8.7–10.2)
CHLORIDE: 94 mmol/L — AB (ref 97–106)
CO2: 30 mmol/L — AB (ref 18–29)
CREATININE: 0.67 mg/dL (ref 0.57–1.00)
GFR calc Af Amer: 121 mL/min/{1.73_m2} (ref >59)
GFR calc non Af Amer: 105 mL/min/{1.73_m2} (ref >59)
Glucose: 104 mg/dL — ABNORMAL HIGH (ref 65–99)
Potassium: 3.4 mmol/L — ABNORMAL LOW (ref 3.5–5.2)
Sodium: 140 mmol/L (ref 136–144)

## 2015-05-09 LAB — LIPID PANEL
CHOLESTEROL TOTAL: 300 mg/dL — AB (ref 100–199)
Chol/HDL Ratio: 8.3 ratio units — ABNORMAL HIGH (ref 0.0–4.4)
HDL: 36 mg/dL — ABNORMAL LOW (ref >39)
LDL CALC: 191 mg/dL — AB (ref 0–99)
TRIGLYCERIDES: 367 mg/dL — AB (ref 0–149)
VLDL Cholesterol Cal: 73 mg/dL — ABNORMAL HIGH (ref 5–40)

## 2015-05-09 MED ORDER — ATORVASTATIN CALCIUM 40 MG PO TABS: 40.0000 mg | ORAL_TABLET | Freq: Every day | ORAL | Status: DC

## 2015-05-10 NOTE — Progress Notes (Signed)
Patient ID: Ann Stone, female   DOB: 11-14-1965, 49 y.o.   MRN: [id number]   Subjective:   Patient ID: Ann Stone female   DOB: 04/06/1966 49 y.o.   MRN: [id number]  HPI: Ann Stone is a 49 y.o. F with a PMHx of conditions listed below presenting to the clinic for a follow up visit after 1 year. Please see assessment and plan for the status of the patient's chronic medical conditions.     Past Medical History  Diagnosis Date  . Leiomyoma of uterus     s/p hysterectomy, hx of uterine hemorrhage  . HTN (hypertension)   . Hyperlipemia   . Depression   . Tobacco abuse   . GERD (gastroesophageal reflux disease)    Current Outpatient Prescriptions  Medication Sig Dispense Refill  . atorvastatin (LIPITOR) 40 MG tablet Take 1 tablet (40 mg total) by mouth daily. 90 tablet 1  . FLUoxetine (PROZAC) 20 MG capsule Take 1 capsule (20 mg total) by mouth daily. 90 capsule 3  . hydrochlorothiazide (HYDRODIURIL) 25 MG tablet TAKE ONE TABLET BY MOUTH EVERY DAY 90 tablet 3  . HYDROcodone-acetaminophen (NORCO) 5-325 MG per tablet Take 1 tablet by mouth every 6 (six) hours as needed for moderate pain. 10 tablet 0  . lisinopril (PRINIVIL,ZESTRIL) 40 MG tablet Take 1 tablet (40 mg total) by mouth daily. 90 tablet 1  . Loratadine 10 MG CAPS Take 1 capsule (10 mg total) by mouth daily. 30 each 12  . meloxicam (MOBIC) 15 MG tablet Take 1 tablet (15 mg total) by mouth daily. 30 tablet 0  . traMADol (ULTRAM) 50 MG tablet Take 2 tablets (100 mg total) by mouth every 8 (eight) hours as needed. 60 tablet 2   No current facility-administered medications for this visit.   No family history on file. Social History   Social History  . Marital Status: Single    Spouse Name: N/A  . Number of Children: N/A  . Years of Education: N/A   Social History Main Topics  . Smoking status: Current Every Day Smoker -- 1.00 packs/day    Types: Cigarettes  . Smokeless tobacco: None     Comment: PATIENT  CUTTING BACK   . Alcohol Use: 0.0 oz/week    0 Standard drinks or equivalent per week     Comment: occasional  . Drug Use: No  . Sexual Activity: Not Asked   Other Topics Concern  . None   Social History Narrative   Review of Systems: Review of Systems  Constitutional: Negative for fever and chills.  HENT: Negative for ear pain.   Eyes: Negative for blurred vision and pain.  Respiratory: Negative for cough, shortness of breath and wheezing.   Cardiovascular: Negative for chest pain, palpitations and leg swelling.  Gastrointestinal: Negative for nausea, vomiting, abdominal pain, diarrhea and constipation.  Genitourinary: Negative for dysuria, urgency and frequency.  Musculoskeletal: Negative for myalgias.  Skin: Negative for itching and rash.  Neurological: Negative for dizziness, sensory change, focal weakness and headaches.   Objective:  Physical Exam: Filed Vitals:   05/08/15 1053  BP: 153/82  Pulse: 96  Temp: 98.3 F (36.8 C)  TempSrc: Oral  Height: 5\' 5"  (1.651 m)  Weight: 217 lb 12.8 oz (98.793 kg)  SpO2: 100%   Physical Exam  Constitutional: She is oriented to person, place, and time. She appears well-developed and well-nourished. No distress.  HENT:  Head: Normocephalic and atraumatic.  Eyes: EOM are normal. Pupils are  equal, round, and reactive to light.  Neck: Neck supple. No tracheal deviation present.  Cardiovascular: Normal rate, regular rhythm and intact distal pulses.   Pulmonary/Chest: Effort normal. No respiratory distress. She has no wheezes. She has no rales.  Abdominal: Soft. Bowel sounds are normal. She exhibits no distension. There is no tenderness.  Musculoskeletal: She exhibits no edema.  Neurological: She is alert and oriented to person, place, and time.  Skin: Skin is warm and dry.   Assessment & Plan:

## 2015-05-10 NOTE — Assessment & Plan Note (Signed)
Give patient PHQ9 and GAD 7 to fill out at next visit to assess for continued depression and/ or anxiety.

## 2015-05-10 NOTE — Assessment & Plan Note (Signed)
Lipid panel at this visit showing Chol 300, HDL 36 with a calculated ASCVD 10 year risk score of 30.8%. Patient is currently on a moderate intensity statin (Simvastatin 40 mg daily). -Simvastatin discontinued -Pt started on high intensity statin (Atorvastatin 40 mg daily).

## 2015-05-10 NOTE — Assessment & Plan Note (Signed)
BP Readings from Last 3 Encounters:  05/08/15 153/82  10/03/14 152/86  05/16/14 131/76    Lab Results  Component Value Date   NA 140 05/08/2015   K 3.4* 05/08/2015   CREATININE 0.67 05/08/2015    Assessment: Blood pressure control:  above goal (<140/90) Comments: Pt is currently on HCTZ 25 mg daily and Lisinopril 20 mg daily.   Plan: Medications:  Continue HCTZ 25 mg daily. Lisinopril increased to 40 mg daily.  Educational resources provided:   Encouraged healthy low-sodium diet and exercise.  Other plans: F/u visit in 1 month.

## 2015-05-10 NOTE — Assessment & Plan Note (Signed)
Patient states she has been using diflucan prophylactically once a week for the past 1 year, stopped 4 months ago. Also reports using Metrogel prophylactically before sexual activity one month ago. Denies having any vaginal discharge, odor, dysuria, or dyspareunia for the past one year. She requested a refill on Diflucan and Metrogel. Reports being sexually active with one partner, condoms used consistently. I explained to her that since she has not had any infections in the past one year, there is no need for her to keep using these medications prophylactically.

## 2015-05-14 NOTE — Progress Notes (Signed)
Internal Medicine Clinic Attending  I saw and evaluated the patient.  I personally confirmed the key portions of the history and exam documented by Dr. Rathore and I reviewed pertinent patient test results.  The assessment, diagnosis, and plan were formulated together and I agree with the documentation in the resident's note.  

## 2016-05-12 ENCOUNTER — Telehealth: Payer: Self-pay | Admitting: Internal Medicine

## 2016-05-12 NOTE — Telephone Encounter (Signed)
APT. REMINDER CALL, LMTCB °

## 2016-05-13 ENCOUNTER — Encounter: Payer: Self-pay | Admitting: Internal Medicine

## 2016-05-13 ENCOUNTER — Other Ambulatory Visit: Payer: Self-pay | Admitting: Internal Medicine

## 2016-05-19 ENCOUNTER — Other Ambulatory Visit: Payer: Self-pay | Admitting: Internal Medicine

## 2016-05-19 NOTE — Telephone Encounter (Signed)
Last office visit:  05/08/2015 Next appt: 07/01/2016 w/pcp

## 2016-05-20 ENCOUNTER — Other Ambulatory Visit: Payer: Self-pay | Admitting: *Deleted

## 2016-05-20 DIAGNOSIS — I10 Essential (primary) hypertension: Secondary | ICD-10-CM

## 2016-05-20 MED ORDER — HYDROCHLOROTHIAZIDE 25 MG PO TABS: ORAL_TABLET | ORAL | 0 refills | Status: DC

## 2016-05-20 NOTE — Telephone Encounter (Signed)
FLUoxetine (PROZAC) 20 MG capsule,  hydrochlorothiazide (HYDRODIURIL) 25 MG tablet,  lisinopril (PRINIVIL,ZESTRIL) 40 MG tablet  Refill request @ [facility] on [address]

## 2016-06-30 ENCOUNTER — Telehealth: Payer: Self-pay | Admitting: Internal Medicine

## 2016-06-30 NOTE — Telephone Encounter (Signed)
APT. REMINDER CALL, LMTCB °

## 2016-07-01 ENCOUNTER — Encounter: Payer: Self-pay | Admitting: Internal Medicine

## 2016-09-17 ENCOUNTER — Ambulatory Visit: Payer: Self-pay

## 2016-09-22 ENCOUNTER — Encounter: Payer: Self-pay | Admitting: Internal Medicine

## 2016-09-22 ENCOUNTER — Ambulatory Visit (INDEPENDENT_AMBULATORY_CARE_PROVIDER_SITE_OTHER): Payer: Self-pay | Admitting: Internal Medicine

## 2016-09-22 VITALS — BP 186/108 | HR 104 | Temp 98.6°F | Ht 65.0 in | Wt 215.3 lb

## 2016-09-22 DIAGNOSIS — F32A Depression, unspecified: Secondary | ICD-10-CM

## 2016-09-22 DIAGNOSIS — R000 Tachycardia, unspecified: Secondary | ICD-10-CM

## 2016-09-22 DIAGNOSIS — E785 Hyperlipidemia, unspecified: Secondary | ICD-10-CM

## 2016-09-22 DIAGNOSIS — Z79899 Other long term (current) drug therapy: Secondary | ICD-10-CM

## 2016-09-22 DIAGNOSIS — I10 Essential (primary) hypertension: Secondary | ICD-10-CM

## 2016-09-22 DIAGNOSIS — F17210 Nicotine dependence, cigarettes, uncomplicated: Secondary | ICD-10-CM

## 2016-09-22 DIAGNOSIS — F329 Major depressive disorder, single episode, unspecified: Secondary | ICD-10-CM

## 2016-09-22 MED ORDER — FLUOXETINE HCL 40 MG PO CAPS: 40.0000 mg | ORAL_CAPSULE | Freq: Every day | ORAL | 3 refills | Status: DC

## 2016-09-22 MED ORDER — HYDROCHLOROTHIAZIDE 25 MG PO TABS: ORAL_TABLET | ORAL | 0 refills | Status: DC

## 2016-09-22 MED ORDER — FLUOXETINE HCL 20 MG PO CAPS
20.0000 mg | ORAL_CAPSULE | Freq: Every day | ORAL | 0 refills | Status: DC
Start: 2016-09-22 — End: 2016-09-22

## 2016-09-22 MED ORDER — LISINOPRIL 40 MG PO TABS: 40.0000 mg | ORAL_TABLET | Freq: Every day | ORAL | 0 refills | Status: DC

## 2016-09-22 MED ORDER — ATORVASTATIN CALCIUM 40 MG PO TABS: 40.0000 mg | ORAL_TABLET | Freq: Every day | ORAL | 1 refills | Status: DC

## 2016-09-22 NOTE — Patient Instructions (Signed)
Start taking prozac 40mg .

## 2016-09-22 NOTE — Assessment & Plan Note (Addendum)
A: BP elevated and she is asymptomatic. She has not been taking her HTN meds for 1 week b/c she is out of refills and has not been seen in clinic since 2016. She is on lisinopril 40mg  and HCTZ 25mg .   P: refill meds, f/u for BP check in 2 weeks. She was able to cite adverse effects of uncontrolled HTN. Checking BMET.

## 2016-09-22 NOTE — Progress Notes (Signed)
Case discussed with Dr. Hulen Luster at the time of the visit. We reviewed the resident's history and exam and pertinent patient test results. I agree with the assessment, diagnosis, and plan of care documented in the resident's note.  If she remains tachycardic at follow-up we will initiate a work-up for that as well.  Review of the record does reveal intermittent tachycardia in the past.

## 2016-09-22 NOTE — Progress Notes (Signed)
   CC: HTN   HPI:  [name] is a 51 y.o. with PMHx as outlined below who presents to clinic for HTN follow up. Please see problem list for further details of patient's chronic medical issues.   Past Medical History:  Diagnosis Date  . Depression   . GERD (gastroesophageal reflux disease)   . HTN (hypertension)   . Hyperlipemia   . Leiomyoma of uterus    s/p hysterectomy, hx of uterine hemorrhage  . Tobacco abuse     Review of Systems:  Denies HA, pressure behind eyes, n/v, poor po intake, dehydration. Depression is stable but she has been feeling more anxious.   Physical Exam:  Vitals:   09/22/16 0932  BP: (!) 199/106  Pulse: (!) 115  Temp: 98.6 F (37 C)  TempSrc: Oral  SpO2: 100%  Weight: 215 lb 4.8 oz (97.7 kg)  Height: 5\' 5"  (1.651 m)   Physical Exam  Constitutional: appears well-developed and well-nourished. No distress.  HENT:  Head: Normocephalic and atraumatic.  Nose: Nose normal.  Cardiovascular: tachycardic, regular rhythm and normal heart sounds.  Exam reveals no gallop and no friction rub.   No murmur heard. Pulmonary/Chest: Effort normal and breath sounds normal. No respiratory distress.  has no wheezes.no rales.  Abdominal: Soft. Bowel sounds are normal.  exhibits no distension. There is no tenderness. There is no rebound and no guarding.  Neurological: alert and oriented to person, place, and time.  Skin: Skin is warm and dry. No rash noted.  not diaphoretic. No erythema. No pallor.    Assessment & Plan:   See Encounters Tab for problem based charting.  Patient discussed with Dr. Eppie Gibson

## 2016-09-22 NOTE — Assessment & Plan Note (Signed)
A: Pt is on prozac 20mg  which she last took one week ago. She feels that she has been more anxious. Denies panic attacks or feeling on edge. Denies new stressors.   P: Increase prozac to 40mg . F/u in 2 weeks.

## 2016-09-22 NOTE — Assessment & Plan Note (Signed)
A: pt on lipitor 40mg . She has not tried any lifestyle modifications. Her TG when last checked were >300.   P: repeat fasting lipid panel, refilled lipitor.

## 2016-09-23 LAB — BMP8+ANION GAP
Anion Gap: 18 mmol/L (ref 10.0–18.0)
BUN / CREAT RATIO: 9 (ref 9–23)
BUN: 7 mg/dL (ref 6–24)
CO2: 25 mmol/L (ref 18–29)
CREATININE: 0.81 mg/dL (ref 0.57–1.00)
Calcium: 9.2 mg/dL (ref 8.7–10.2)
Chloride: 99 mmol/L (ref 96–106)
GFR calc Af Amer: 99 mL/min/{1.73_m2} (ref >59)
GFR calc non Af Amer: 86 mL/min/{1.73_m2} (ref >59)
Glucose: 100 mg/dL — ABNORMAL HIGH (ref 65–99)
Potassium: 3.9 mmol/L (ref 3.5–5.2)
Sodium: 142 mmol/L (ref 134–144)

## 2016-10-06 ENCOUNTER — Ambulatory Visit (INDEPENDENT_AMBULATORY_CARE_PROVIDER_SITE_OTHER): Payer: Self-pay | Admitting: Internal Medicine

## 2016-10-06 VITALS — BP 129/62 | HR 92 | Temp 98.7°F | Ht 65.0 in | Wt 212.9 lb

## 2016-10-06 DIAGNOSIS — Z1211 Encounter for screening for malignant neoplasm of colon: Secondary | ICD-10-CM | POA: Insufficient documentation

## 2016-10-06 DIAGNOSIS — Z0000 Encounter for general adult medical examination without abnormal findings: Secondary | ICD-10-CM

## 2016-10-06 DIAGNOSIS — F339 Major depressive disorder, recurrent, unspecified: Secondary | ICD-10-CM

## 2016-10-06 DIAGNOSIS — I10 Essential (primary) hypertension: Secondary | ICD-10-CM

## 2016-10-06 DIAGNOSIS — Z90710 Acquired absence of both cervix and uterus: Secondary | ICD-10-CM

## 2016-10-06 DIAGNOSIS — F17210 Nicotine dependence, cigarettes, uncomplicated: Secondary | ICD-10-CM

## 2016-10-06 NOTE — Progress Notes (Signed)
CC: hypertension  HPI:  [name] is a 51 y.o. with a PMH of depression, GERD, HTN, HLD, and tobacco abuse presenting to clinic for follow up on her hypertension.  Patient was seen in clinic 2 weeks ago for hypertension; at the time was found to be hypertensive to 199/106 after being out of emds for one week. Patient was asymptomatic at the time. Her Lisinopril 40mg  daily and HCTZ 25mg  daily were refilled and restarted. Bmet at the visit showed Cr 0.81, BUN 7, Na 142, K 3.9. Patient states that she has been able to take her medicines regularly and has no complaints. She denies headaches, vision changes, hearing changes, lower extremity edema, chest pain, shortness of breath, nausea, vomiting. Currently she is not following a particular diet but has cut out beef and port a couple of years ago. She drinks several sodas throughout the day. She is thinking of joining the gym to help increase her activity level.   Patient was also increased on her prozac to 40mg  daily. She states that she has felt a little better since increasing the dose with better control of her anxiety. We discussed that it may be a couple more weeks until full effect of the increased dose occurs.   Per chart review, patient had acute blood loss anemia 2/2 abnormal uterine bleeding in 2003, which resulted in emergency hysterectomy. Due to patient being a Jehovah's witness, she declined blood transfusions. Hgb dropped to low of 2.5. 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 It does not appear that her CBC has been checked since. There is no echo on record. Patient denies signs or symptoms of anemia. Last CBC was 2012 which showed Hgb 15.3.   Please see problem based Assessment and Plan for status of patients chronic conditions.  Past Medical History:  Diagnosis Date  . Depression   . GERD (gastroesophageal reflux disease)   . HTN (hypertension)   . Hyperlipemia   . Leiomyoma of uterus    s/p hysterectomy, hx of uterine hemorrhage  . Tobacco abuse     Review of Systems:   Review of Systems  Constitutional: Negative for malaise/fatigue.  HENT: Negative for hearing loss and tinnitus.   Eyes: Negative for blurred vision, double vision and pain.  Respiratory: Negative for shortness of breath.   Cardiovascular: Negative for chest pain, palpitations and leg swelling.  Gastrointestinal: Negative for abdominal pain, nausea and vomiting.  Neurological: Negative for dizziness, sensory change, focal weakness and headaches.  Psychiatric/Behavioral: The patient is nervous/anxious (improved).     Physical Exam:  Vitals:   10/06/16 0837  BP: 129/62  Pulse: 92  Temp: 98.7 F (37.1 C)  TempSrc: Oral  SpO2: 100%  Weight: 212 lb 14.4 oz (96.6 kg)  Height: 5\' 5"  (1.651 m)   Physical Exam  Constitutional: She is oriented to person, place, and time. She appears well-developed and well-nourished. No distress.  HENT:  Head: Normocephalic and atraumatic.  Eyes: EOM are normal.  Neck: Normal range of motion. Neck supple. No thyromegaly present.  Cardiovascular: Normal rate, regular rhythm, normal heart sounds and intact distal pulses.  Exam reveals no gallop and no friction rub.   No murmur heard. Pulmonary/Chest: Effort normal and breath sounds normal. She has no wheezes. She has no rales.  Abdominal: Soft. Bowel sounds are normal. She exhibits no distension. There is no tenderness. There is no guarding.  Musculoskeletal: Normal range of motion. She exhibits no edema.  Neurological: She  is alert and oriented to person, place, and time. No cranial nerve deficit.  Skin: Skin is warm. Capillary refill takes less than 2 seconds. No rash noted. She is not diaphoretic. No erythema.  Psychiatric: She has a normal mood and affect. Her behavior is normal. Judgment and thought content normal.    Assessment & Plan:   See Encounters Tab for problem based charting.   Patient discussed  with Dr. Angelia Mould   Alphonzo Grieve, MD Internal Medicine PGY1

## 2016-10-06 NOTE — Patient Instructions (Addendum)
It was nice meeting you today!  We will check your blood work today for your blood counts. I will call you if they are abnormal.   Continue taking your blood pressure medicines.  Please follow up with your PCP, Dr. Marlowe Sax in about 2 months.

## 2016-10-06 NOTE — Assessment & Plan Note (Addendum)
Patient was seen in clinic 2 weeks ago for hypertension; at the time was found to be hypertensive to 199/106 after being out of emds for one week. Patient was asymptomatic at the time. Her Lisinopril 40mg  daily and HCTZ 25mg  daily were refilled and restarted. Bmet at the visit showed Cr 0.81, BUN 7, Na 142, K 3.9. Patient states that she has been able to take her medicines regularly and has no complaints. She denies headaches, vision changes, hearing changes, lower extremity edema, chest pain, shortness of breath, nausea, vomiting. Currently she is not following a particular diet but has cut out beef and port a couple of years ago. She drinks several sodas throughout the day. She is thinking of joining the gym to help increase her activity level.  BP 129/62 in clinic today - well controlled now that she is back on meds, no current need for medication adjustment  Plan: --continue lisinopril 40mg  daily and HCTZ 25mg  daily --advised on diet and exercise changes to promote weight loss and better control of her BP with possibility of decreasing meds

## 2016-10-06 NOTE — Assessment & Plan Note (Signed)
Patient was also increased on her prozac to 40mg  daily. She states that she has felt a little better since increasing the dose with better control of her anxiety. We discussed that it may be a couple more weeks until full effect of the increased dose occurs.  Plan: --continue prozac 40mg  daily

## 2016-10-07 LAB — CBC
HEMATOCRIT: 42.3 % (ref 34.0–46.6)
Hemoglobin: 14.6 g/dL (ref 11.1–15.9)
MCH: 33.1 pg — ABNORMAL HIGH (ref 26.6–33.0)
MCHC: 34.5 g/dL (ref 31.5–35.7)
MCV: 96 fL (ref 79–97)
PLATELETS: 337 10*3/uL (ref 150–379)
RBC: 4.41 x10E6/uL (ref 3.77–5.28)
RDW: 13.8 % (ref 12.3–15.4)
WBC: 7.5 10*3/uL (ref 3.4–10.8)

## 2016-10-07 LAB — FERRITIN: Ferritin: 336 ng/mL — ABNORMAL HIGH (ref 15–150)

## 2016-10-09 NOTE — Progress Notes (Signed)
Internal Medicine Clinic Attending  Case discussed with Dr. Svalina  at the time of the visit.  We reviewed the resident's history and exam and pertinent patient test results.  I agree with the assessment, diagnosis, and plan of care documented in the resident's note.  

## 2017-02-10 ENCOUNTER — Encounter: Payer: Self-pay | Admitting: Internal Medicine

## 2017-03-18 ENCOUNTER — Other Ambulatory Visit: Payer: Self-pay

## 2017-03-18 DIAGNOSIS — I10 Essential (primary) hypertension: Secondary | ICD-10-CM

## 2017-03-18 DIAGNOSIS — F32A Depression, unspecified: Secondary | ICD-10-CM

## 2017-03-18 DIAGNOSIS — F329 Major depressive disorder, single episode, unspecified: Secondary | ICD-10-CM

## 2017-03-18 NOTE — Telephone Encounter (Signed)
FLUoxetine (PROZAC) 40 MG capsule  hydrochlorothiazide (HYDRODIURIL) 25 MG tablet  lisinopril (PRINIVIL,ZESTRIL) 40 MG tablet, Refill request @ [facility] on [address]

## 2017-03-19 ENCOUNTER — Other Ambulatory Visit: Payer: Self-pay | Admitting: Internal Medicine

## 2017-03-19 DIAGNOSIS — F329 Major depressive disorder, single episode, unspecified: Secondary | ICD-10-CM

## 2017-03-19 DIAGNOSIS — F32A Depression, unspecified: Secondary | ICD-10-CM

## 2017-03-19 MED ORDER — FLUOXETINE HCL 40 MG PO CAPS: 40.0000 mg | ORAL_CAPSULE | Freq: Every day | ORAL | 0 refills | Status: DC

## 2017-03-19 MED ORDER — HYDROCHLOROTHIAZIDE 25 MG PO TABS: ORAL_TABLET | ORAL | 3 refills | Status: DC

## 2017-03-19 MED ORDER — LISINOPRIL 40 MG PO TABS: 40.0000 mg | ORAL_TABLET | Freq: Every day | ORAL | 3 refills | Status: DC

## 2017-03-24 ENCOUNTER — Encounter: Payer: Self-pay | Admitting: Internal Medicine

## 2017-03-24 ENCOUNTER — Ambulatory Visit (INDEPENDENT_AMBULATORY_CARE_PROVIDER_SITE_OTHER): Payer: Self-pay | Admitting: Internal Medicine

## 2017-03-24 ENCOUNTER — Ambulatory Visit (HOSPITAL_COMMUNITY)
Admission: RE | Admit: 2017-03-24 | Discharge: 2017-03-24 | Disposition: A | Discharge status: Home / Self Care | Payer: Self-pay | Source: Ambulatory Visit | Attending: Internal Medicine | Admitting: Internal Medicine

## 2017-03-24 VITALS — BP 171/83 | HR 89 | Temp 98.7°F | Ht 65.0 in | Wt 220.9 lb

## 2017-03-24 DIAGNOSIS — F329 Major depressive disorder, single episode, unspecified: Secondary | ICD-10-CM

## 2017-03-24 DIAGNOSIS — I10 Essential (primary) hypertension: Secondary | ICD-10-CM

## 2017-03-24 DIAGNOSIS — F17200 Nicotine dependence, unspecified, uncomplicated: Secondary | ICD-10-CM

## 2017-03-24 DIAGNOSIS — Z598 Other problems related to housing and economic circumstances: Secondary | ICD-10-CM

## 2017-03-24 DIAGNOSIS — R0789 Other chest pain: Secondary | ICD-10-CM | POA: Insufficient documentation

## 2017-03-24 DIAGNOSIS — F339 Major depressive disorder, recurrent, unspecified: Secondary | ICD-10-CM

## 2017-03-24 DIAGNOSIS — F17210 Nicotine dependence, cigarettes, uncomplicated: Secondary | ICD-10-CM

## 2017-03-24 DIAGNOSIS — R110 Nausea: Secondary | ICD-10-CM

## 2017-03-24 DIAGNOSIS — F32A Depression, unspecified: Secondary | ICD-10-CM

## 2017-03-24 DIAGNOSIS — R9431 Abnormal electrocardiogram [ECG] [EKG]: Secondary | ICD-10-CM | POA: Insufficient documentation

## 2017-03-24 DIAGNOSIS — M25572 Pain in left ankle and joints of left foot: Secondary | ICD-10-CM

## 2017-03-24 DIAGNOSIS — S99912A Unspecified injury of left ankle, initial encounter: Secondary | ICD-10-CM | POA: Insufficient documentation

## 2017-03-24 DIAGNOSIS — Z0000 Encounter for general adult medical examination without abnormal findings: Secondary | ICD-10-CM

## 2017-03-24 DIAGNOSIS — E785 Hyperlipidemia, unspecified: Secondary | ICD-10-CM

## 2017-03-24 DIAGNOSIS — Z79899 Other long term (current) drug therapy: Secondary | ICD-10-CM

## 2017-03-24 MED ORDER — NICOTINE 21 MG/24HR TD PT24: 21.0000 mg | MEDICATED_PATCH | Freq: Every day | TRANSDERMAL | 0 refills | Status: DC

## 2017-03-24 MED ORDER — HYDROCHLOROTHIAZIDE 25 MG PO TABS: ORAL_TABLET | ORAL | 0 refills | Status: DC

## 2017-03-24 MED ORDER — LISINOPRIL 40 MG PO TABS: 40.0000 mg | ORAL_TABLET | Freq: Every day | ORAL | 0 refills | Status: DC

## 2017-03-24 MED ORDER — NAPROXEN 500 MG PO TABS: 500.0000 mg | ORAL_TABLET | Freq: Two times a day (BID) | ORAL | 0 refills | Status: DC

## 2017-03-24 MED ORDER — PANTOPRAZOLE SODIUM 40 MG PO TBEC: 40.0000 mg | DELAYED_RELEASE_TABLET | Freq: Every day | ORAL | 0 refills | Status: DC

## 2017-03-24 MED ORDER — NICOTINE POLACRILEX 2 MG MT GUM: CHEWING_GUM | OROMUCOSAL | 2 refills | Status: DC

## 2017-03-24 MED ORDER — FLUOXETINE HCL 40 MG PO CAPS: 40.0000 mg | ORAL_CAPSULE | Freq: Every day | ORAL | 0 refills | Status: DC

## 2017-03-24 MED ORDER — NICOTINE 21 MG/24HR TD PT24: 21.0000 mg | MEDICATED_PATCH | Freq: Every day | TRANSDERMAL | 0 refills | Status: AC

## 2017-03-24 MED FILL — SM NICOTINE 2 MG CHEWING GU: 2 | 5 days supply | Qty: 110 | Fill #0

## 2017-03-24 MED FILL — HYDROCHLOROTHIAZIDE 25 MG T: 25 | 30 days supply | Qty: 30 | Fill #0

## 2017-03-24 MED FILL — NAPROXEN 500 MG TABLET: 500 | 10 days supply | Qty: 20 | Fill #0

## 2017-03-24 MED FILL — NICOTINE 21 MG/24HR PATCH: 21 | 28 days supply | Qty: 28 | Fill #0

## 2017-03-24 MED FILL — PANTOPRAZOLE SOD DR 40 MG T: 40 | 30 days supply | Qty: 30 | Fill #0

## 2017-03-24 MED FILL — LISINOPRIL 40 MG TAB: 40 | 30 days supply | Qty: 30 | Fill #0

## 2017-03-24 MED FILL — FLUoxetine HCL 40 MG CAPS: 40 | 30 days supply | Qty: 30 | Fill #0

## 2017-03-24 NOTE — Assessment & Plan Note (Signed)
Patient reports having chronic occasional, non-radiating substernal chest burning sensation and tightness/ heaviness associated with nausea. States her symptoms occur usually at night when she is lying down but can also occur when walking during the day. She has not had any symptoms in the past 2 weeks. States her symptoms always resolve soon after she takes an over-the-counter antacid. Denies having any associated shortness of breath, dizziness, or diaphoresis. Although her symptoms could be related to GERD/dyspepsia, there is concern for possible coronary artery disease as patient has several risk factors including her age, hypertension, hyperlipidemia, and current tobacco use. EKG done at this visit not suggestive of any ST/T-wave abnormalities.  Plan -Exercise stress test has been ordered. Patient is currently uninsured and in the process of applying for an orange card. The study can be done after she receives her orange card. Her left ankle pain would also be expected to resolve by then. -Protonix 40 mg daily 30 minutes before breakfast

## 2017-03-24 NOTE — Assessment & Plan Note (Signed)
Uncontrolled hypertension. Initial reading 171/74 and repeat 177/83. Patient states she ran out of her blood pressure medications 2 weeks ago. I explained to her that her medications were refilled as requested, she informed me her pharmacy told her that they did not have the medications. She is currently taking her son's lisinopril tablets which she believes are the 20 mg dose. Reports having occasional headaches. Denies having any focal weakness or numbness. Denies having any shortness of breath. No focal deficits on exam.  Plan -Advised her to pick up her medications (lisinopril 40 mg daily and hydrochlorothiazide 25 mg daily) from the pharmacy. Medications have been sent to the [facility] as patient is currently uninsured. -Return to the clinic in 3-4 weeks for a follow-up

## 2017-03-24 NOTE — Patient Instructions (Addendum)
Ms. Metsker it was nice seeing you today.   -Start taking lisinopril and hydrochlorothiazide again as instructed for your high blood pressure.  -Started using nicotine patch and gum as instructed  -Start taking Prozac again as instructed for depression  -Take naproxen 500 mg twice daily with a meal for your ankle pain. Take this medication regularly for the next 5 days and then only if needed after that.  -Take Protonix 40 mg once daily 30 minutes before breakfast for acid reflux  -I have ordered a stress test to check your heart.  -Return for follow-up in 3-4 weeks.

## 2017-03-24 NOTE — Assessment & Plan Note (Signed)
Patient reports twisting her left ankle while walking 5 days ago. States it was initially swollen and painful but her symptoms have now improved. She has been taking over-the-counter ibuprofen which helps. She continues to experience some pain on the frontolateral aspects of the ankle when walking long distances. On exam, she had normal range of motion and no edema/ obvious deformity. She is able to bear weight on her left lower extremity.  Plan -Naproxen 500 mg twice daily for the next 5 days and then only if needed. Advised her to avoid any other over-the-counter NSAIDs.

## 2017-03-24 NOTE — Assessment & Plan Note (Signed)
Currently smoking less than 1 pack per day. She has been smoking for 20+ years. I discussed smoking cessation and patient is interested in quitting.  Plan -NicoDerm 21 mg patch for 6 weeks -Nicotine gum as needed -Reassess at follow-up visit in 6 weeks

## 2017-03-24 NOTE — Assessment & Plan Note (Signed)
Patient states Prozac is helping with her mood, however, she ran out of the medication 2 weeks ago. Explained to her that her medication was recently refilled.  Plan -Prozac 40 mg daily. Medication has been sent to the [facility].

## 2017-03-24 NOTE — Assessment & Plan Note (Addendum)
Patient declines influenza vaccine as she had an allergic reaction in the past - arm became extremely swollen at the site of injection.

## 2017-03-24 NOTE — Assessment & Plan Note (Addendum)
Patient has not been taking Lipitor as it caused her to have muscle cramps and nausea in the past. She is taking over-the-counter fish oil instead.  Plan -Recheck lipid panel -Explained to her that she will likely continue to require a statin. Based on lipid panel results, will consider switching her to a hydrophilic formulation for better tolerance. -Educated patient about healthy eating and exercise. Emphasized the importance of weight loss.  -Referral to [facility] for counseling on nutrition  Addendum: Cholesterol 268, HDL 38, LDL 173, and triglycerides 284. Her 10 year ASCVD risk score is 39%. Spoke to the patient over the phone. Advised her to start taking hydrophilic statin (Crestor 20 mg daily).

## 2017-03-24 NOTE — Progress Notes (Signed)
   CC: I twisted my ankle.  HPI:  Ann Stone is a 51 y.o. female with a past medical history of conditions listed below presenting to the clinic to discuss an injury to her left ankle. Hypertension, hyperlipidemia, tobacco use, and depression were also discussed. Please see problem based charting for the status of the patient's current and chronic medical conditions.   Past Medical History:  Diagnosis Date  . Depression   . GERD (gastroesophageal reflux disease)   . HTN (hypertension)   . Hyperlipemia   . Leiomyoma of uterus    s/p hysterectomy, hx of uterine hemorrhage  . Tobacco abuse    Review of Systems: Please see problem based charting for the status of the patient's current and chronic medical conditions.   Physical Exam:  Vitals:   03/24/17 1450 03/24/17 1600  BP: (!) 171/74 (!) 171/83  Pulse: 92 89  Temp: 98.7 F (37.1 C)   TempSrc: Oral   SpO2: 100%   Weight: 220 lb 14.4 oz (100.2 kg)   Height: 5\' 5"  (1.651 m)    Physical Exam  Constitutional: She is oriented to person, place, and time. She appears well-developed and well-nourished. No distress.  HENT:  Head: Normocephalic and atraumatic.  Mouth/Throat: Oropharynx is clear and moist.  Eyes: EOM are normal. Right eye exhibits no discharge. Left eye exhibits no discharge.  Cardiovascular: Normal rate, regular rhythm and intact distal pulses.   Pulmonary/Chest: Effort normal and breath sounds normal. No respiratory distress. She has no wheezes. She has no rales.  Abdominal: Soft. Bowel sounds are normal. She exhibits no distension. There is no tenderness.  Musculoskeletal: She exhibits no edema.  Left ankle: No erythema, increased warmth, or swelling. Normal range of motion. No bruises or obvious deformity.  Neurological: She is alert and oriented to person, place, and time. Coordination normal.  Strength and sensation grossly intact throughout.  Skin: Skin is warm and dry.  Psychiatric: She has a normal  mood and affect. Her behavior is normal.    Assessment & Plan:   See Encounters Tab for problem based charting.  Patient discussed with Dr. Dareen Piano

## 2017-03-25 LAB — LIPID PANEL
Chol/HDL Ratio: 7.1 ratio — ABNORMAL HIGH (ref 0.0–4.4)
Cholesterol, Total: 268 mg/dL — ABNORMAL HIGH (ref 100–199)
HDL: 38 mg/dL — ABNORMAL LOW (ref >39)
LDL CALC: 173 mg/dL — AB (ref 0–99)
Triglycerides: 284 mg/dL — ABNORMAL HIGH (ref 0–149)
VLDL CHOLESTEROL CAL: 57 mg/dL — AB (ref 5–40)

## 2017-03-25 MED ORDER — ROSUVASTATIN CALCIUM 20 MG PO TABS: 20.0000 mg | ORAL_TABLET | Freq: Every day | ORAL | 3 refills | Status: DC

## 2017-03-25 NOTE — Addendum Note (Signed)
Addended by: Shela Leff on: 03/25/2017 06:06 PM   Modules accepted: Orders

## 2017-03-26 NOTE — Addendum Note (Signed)
Addended by: Aldine Contes on: 03/26/2017 09:41 AM   Modules accepted: Level of Service

## 2017-03-26 NOTE — Progress Notes (Signed)
Internal Medicine Clinic Attending  Case discussed with Dr. Rathoreat the time of the visit. We reviewed the resident's history and exam and pertinent patient test results. I agree with the assessment, diagnosis, and plan of care documented in the resident's note.  

## 2017-04-06 ENCOUNTER — Ambulatory Visit: Payer: Self-pay | Admitting: Dietician

## 2017-04-10 NOTE — Addendum Note (Signed)
Addended by: Hulan Fray on: 04/10/2017 04:07 PM   Modules accepted: Orders

## 2017-04-30 MED FILL — PANTOPRAZOLE SOD DR 40 MG T: 40 | 30 days supply | Qty: 30 | Fill #1

## 2017-04-30 MED FILL — FLUoxetine HCL 40 MG CAPS: 40 | 30 days supply | Qty: 30 | Fill #1

## 2017-04-30 MED FILL — LISINOPRIL 40 MG TABLET: 40 | 30 days supply | Qty: 30 | Fill #1

## 2017-04-30 MED FILL — HYDROCHLOROTHIAZIDE 25 MG T: 25 | 30 days supply | Qty: 30 | Fill #1

## 2017-06-09 ENCOUNTER — Ambulatory Visit: Payer: Self-pay | Admitting: Internal Medicine

## 2017-06-09 ENCOUNTER — Encounter: Payer: Self-pay | Admitting: Internal Medicine

## 2017-06-09 ENCOUNTER — Other Ambulatory Visit: Payer: Self-pay

## 2017-06-09 VITALS — BP 182/89 | HR 97 | Temp 98.5°F | Ht 65.0 in | Wt 223.0 lb

## 2017-06-09 DIAGNOSIS — I10 Essential (primary) hypertension: Secondary | ICD-10-CM

## 2017-06-09 DIAGNOSIS — R300 Dysuria: Secondary | ICD-10-CM

## 2017-06-09 LAB — POCT URINALYSIS DIPSTICK
BILIRUBIN UA: NEGATIVE
Blood, UA: NEGATIVE
GLUCOSE UA: NEGATIVE
KETONES UA: NEGATIVE
LEUKOCYTES UA: NEGATIVE
Nitrite, UA: NEGATIVE
PROTEIN UA: NEGATIVE
SPEC GRAV UA: 1.015 (ref 1.010–1.025)
Urobilinogen, UA: 0.2 E.U./dL
pH, UA: 5.5 (ref 5.0–8.0)

## 2017-06-09 NOTE — Patient Instructions (Signed)
Ann Stone,  Your urine dipstick looks okay today but we will send it off for more testing. I will also send of the cultures we took. They should come back tomorrow and I will give you a call. I will also check some blood work today.   Please fill your blood pressure medications and start taking them again. I would like you to follow up in 2-3 weeks for blood pressure re-check.  FOLLOW-UP INSTRUCTIONS When: 2-3 weeks For: Blood pressure What to bring: Medications

## 2017-06-09 NOTE — Progress Notes (Signed)
   CC: Dysuria  HPI:  [name] is a 51 y.o. female with a past medical history listed below here today with complaints of dysuria.   She reports that for the past 3-4 days she has noted burning with urination, urinary frequency and urgency. She reports having UTI in the past several years ago and this feels similar. She denies any fevers, chills, nausea, vomiting, flank pain. Eating and drinking without difficulty. Denies any vaginal discharge or itching. Does report history of frequent yeast infections and BV in the past but has not had any issues with this in several years. Reports she is monogamous with her husband of 7 years and does not use any contraception.   Past Medical History:  Diagnosis Date  . Depression   . GERD (gastroesophageal reflux disease)   . HTN (hypertension)   . Hyperlipemia   . Leiomyoma of uterus    s/p hysterectomy, hx of uterine hemorrhage  . Tobacco abuse    Review of Systems:   Negative except as noted in HPI  Physical Exam:  Vitals:   06/09/17 1106  BP: (!) 182/89  Pulse: 97  Temp: 98.5 F (36.9 C)  TempSrc: Oral  SpO2: 100%  Weight: 223 lb (101.2 kg)  Height: 5\' 5"  (1.651 m)   Physical Exam  Constitutional: She is well-developed, well-nourished, and in no distress. No distress.  HENT:  Head: Normocephalic and atraumatic.  Cardiovascular: Normal rate, regular rhythm and normal heart sounds.  Pulmonary/Chest: Effort normal and breath sounds normal.  Abdominal: Soft. Bowel sounds are normal. She exhibits no distension. There is no tenderness.  Genitourinary: Vulva normal. Creamy  odorless  white and vaginal discharge found.  Skin: Skin is warm and dry.  Psychiatric: Mood and affect normal.  Vitals reviewed.   Assessment & Plan:   See Encounters Tab for problem based charting.  Patient discussed with Dr. Daryll Drown

## 2017-06-10 DIAGNOSIS — R300 Dysuria: Secondary | ICD-10-CM | POA: Insufficient documentation

## 2017-06-10 LAB — URINALYSIS, COMPLETE
BILIRUBIN UA: NEGATIVE
GLUCOSE, UA: NEGATIVE
KETONES UA: NEGATIVE
LEUKOCYTES UA: NEGATIVE
Nitrite, UA: NEGATIVE
PROTEIN UA: NEGATIVE
RBC UA: NEGATIVE
SPEC GRAV UA: 1.01 (ref 1.005–1.030)
UUROB: 0.2 mg/dL (ref 0.2–1.0)
pH, UA: 5 (ref 5.0–7.5)

## 2017-06-10 LAB — BMP8+ANION GAP
Anion Gap: 15 mmol/L (ref 10.0–18.0)
BUN / CREAT RATIO: 9 (ref 9–23)
BUN: 7 mg/dL (ref 6–24)
CO2: 26 mmol/L (ref 20–29)
CREATININE: 0.76 mg/dL (ref 0.57–1.00)
Calcium: 9.3 mg/dL (ref 8.7–10.2)
Chloride: 98 mmol/L (ref 96–106)
GFR, EST AFRICAN AMERICAN: 107 mL/min/{1.73_m2} (ref >59)
GFR, EST NON AFRICAN AMERICAN: 92 mL/min/{1.73_m2} (ref >59)
Glucose: 101 mg/dL — ABNORMAL HIGH (ref 65–99)
POTASSIUM: 5 mmol/L (ref 3.5–5.2)
SODIUM: 139 mmol/L (ref 134–144)

## 2017-06-10 LAB — CERVICOVAGINAL ANCILLARY ONLY
Bacterial vaginitis: POSITIVE — AB
CANDIDA VAGINITIS: NEGATIVE
Chlamydia: NEGATIVE
Neisseria Gonorrhea: NEGATIVE
TRICH (WINDOWPATH): NEGATIVE

## 2017-06-10 LAB — MICROSCOPIC EXAMINATION
BACTERIA UA: NONE SEEN
CASTS: NONE SEEN /LPF
Epithelial Cells (non renal): NONE SEEN /hpf (ref 0–10)

## 2017-06-10 MED ORDER — METRONIDAZOLE 500 MG PO TABS: 500.0000 mg | ORAL_TABLET | Freq: Two times a day (BID) | ORAL | 0 refills | Status: DC

## 2017-06-10 MED FILL — metroNIDAZOLE 500 MG TABS: 500 | 7 days supply | Qty: 14 | Fill #0

## 2017-06-10 NOTE — Assessment & Plan Note (Signed)
BP Readings from Last 3 Encounters:  06/09/17 (!) 182/89  03/24/17 (!) 171/83  10/06/16 129/62    Lab Results  Component Value Date   NA 139 06/09/2017   K 5.0 06/09/2017   CREATININE 0.76 06/09/2017   SBP in the 180s today. Patient is currently prescribed lisinopril 40 mg daily and HCTZ 25 mg daily. Pharmacy reports shows last refill 10/18 for 30 day supply. She reports being out of her medications for the past 3 days. Denies any chest pain, shortness of breath, or otherwise today. She reports she is planning on getting her medications refilled today.  A/P: Re-start previous medications Re-check BMET today > unremarkable Follow up in 3 weeks for re-check

## 2017-06-10 NOTE — Assessment & Plan Note (Addendum)
Patient with symptoms of dysuria today. Urine dipstick unremarkable. Full UA sent and is also unremarkable without signs of infection. GU exam with scant thick white discharge. May be physiologic discharge, swab sent for GC/Ch, BV, Yeast, trich. Will follow results. No indications for antibiotics at this time.   ADDENDUM Wet prep positive for BV. Will send in Rx for Flagyl 500 mg bid x 7 days. Discussed with patient over the phone.

## 2017-06-10 NOTE — Addendum Note (Signed)
Addended by: [place] on: 06/10/2017 03:52 PM   Modules accepted: Orders

## 2017-06-11 NOTE — Progress Notes (Signed)
Internal Medicine Clinic Attending  Case discussed with Dr. Boswell at the time of the visit.  We reviewed the resident's history and exam and pertinent patient test results.  I agree with the assessment, diagnosis, and plan of care documented in the resident's note.  

## 2017-06-12 MED FILL — FLUoxetine HCL 40 MG CAPS: 40 | 30 days supply | Qty: 30 | Fill #2

## 2017-06-12 MED FILL — HYDROCHLOROTHIAZIDE 25 MG T: 25 | 30 days supply | Qty: 30 | Fill #2

## 2017-06-12 MED FILL — LISINOPRIL 40 MG TABLET: 40 | 30 days supply | Qty: 30 | Fill #2

## 2017-06-12 MED FILL — PANTOPRAZOLE SOD DR 40 MG T: 40 | 30 days supply | Qty: 30 | Fill #2

## 2017-07-01 ENCOUNTER — Ambulatory Visit: Payer: Self-pay

## 2017-07-01 ENCOUNTER — Encounter: Payer: Self-pay | Admitting: Internal Medicine

## 2017-07-29 ENCOUNTER — Other Ambulatory Visit: Payer: Self-pay

## 2017-07-29 DIAGNOSIS — I10 Essential (primary) hypertension: Secondary | ICD-10-CM

## 2017-07-29 DIAGNOSIS — F329 Major depressive disorder, single episode, unspecified: Secondary | ICD-10-CM

## 2017-07-29 DIAGNOSIS — F32A Depression, unspecified: Secondary | ICD-10-CM

## 2017-07-29 NOTE — Telephone Encounter (Signed)
FLUoxetine (PROZAC) 40 MG capsule  hydrochlorothiazide (HYDRODIURIL) 25 MG tablet  lisinopril (PRINIVIL,ZESTRIL) 40 MG tablet  pantoprazole (PROTONIX) 40 MG tablet, Refill request @ outpatient pharmacy.

## 2017-07-29 NOTE — Telephone Encounter (Signed)
Spoke with the patient.  Appt has been sch for tomorrow with the [facility] @ 10:15am.

## 2017-07-30 ENCOUNTER — Ambulatory Visit: Payer: Self-pay

## 2017-07-30 ENCOUNTER — Encounter: Payer: Self-pay | Admitting: Internal Medicine

## 2017-07-31 MED ORDER — LISINOPRIL 40 MG PO TABS: 40.0000 mg | ORAL_TABLET | Freq: Every day | ORAL | 0 refills | Status: DC

## 2017-07-31 MED ORDER — FLUOXETINE HCL 40 MG PO CAPS: 40.0000 mg | ORAL_CAPSULE | Freq: Every day | ORAL | 0 refills | Status: DC

## 2017-07-31 MED ORDER — HYDROCHLOROTHIAZIDE 25 MG PO TABS: ORAL_TABLET | ORAL | 0 refills | Status: DC

## 2017-07-31 MED ORDER — PANTOPRAZOLE SODIUM 40 MG PO TBEC: 40.0000 mg | DELAYED_RELEASE_TABLET | Freq: Every day | ORAL | 0 refills | Status: DC

## 2017-08-12 ENCOUNTER — Encounter: Payer: Self-pay | Admitting: Internal Medicine

## 2017-08-17 ENCOUNTER — Other Ambulatory Visit: Payer: Self-pay

## 2017-08-17 ENCOUNTER — Emergency Department (HOSPITAL_COMMUNITY): Payer: No Typology Code available for payment source

## 2017-08-17 ENCOUNTER — Emergency Department (HOSPITAL_COMMUNITY)
Admission: EM | Admit: 2017-08-17 | Discharge: 2017-08-17 | Disposition: A | Discharge status: Home / Self Care | Payer: No Typology Code available for payment source | Attending: Emergency Medicine | Admitting: Emergency Medicine

## 2017-08-17 ENCOUNTER — Encounter (HOSPITAL_COMMUNITY): Payer: Self-pay | Admitting: *Deleted

## 2017-08-17 DIAGNOSIS — Y999 Unspecified external cause status: Secondary | ICD-10-CM | POA: Insufficient documentation

## 2017-08-17 DIAGNOSIS — F17210 Nicotine dependence, cigarettes, uncomplicated: Secondary | ICD-10-CM | POA: Diagnosis not present

## 2017-08-17 DIAGNOSIS — Z79899 Other long term (current) drug therapy: Secondary | ICD-10-CM | POA: Diagnosis not present

## 2017-08-17 DIAGNOSIS — Y939 Activity, unspecified: Secondary | ICD-10-CM | POA: Insufficient documentation

## 2017-08-17 DIAGNOSIS — M791 Myalgia, unspecified site: Secondary | ICD-10-CM

## 2017-08-17 DIAGNOSIS — I10 Essential (primary) hypertension: Secondary | ICD-10-CM | POA: Diagnosis not present

## 2017-08-17 DIAGNOSIS — Y929 Unspecified place or not applicable: Secondary | ICD-10-CM | POA: Diagnosis not present

## 2017-08-17 DIAGNOSIS — N644 Mastodynia: Secondary | ICD-10-CM | POA: Insufficient documentation

## 2017-08-17 DIAGNOSIS — R0789 Other chest pain: Secondary | ICD-10-CM | POA: Insufficient documentation

## 2017-08-17 MED ORDER — IBUPROFEN 800 MG PO TABS: 800.0000 mg | ORAL_TABLET | Freq: Three times a day (TID) | ORAL | 0 refills | Status: DC | PRN
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
## 2017-08-20 ENCOUNTER — Ambulatory Visit: Payer: Self-pay | Admitting: Internal Medicine

## 2017-08-20 VITALS — BP 160/90 | HR 98 | Temp 98.0°F | Ht 65.0 in | Wt 225.4 lb

## 2017-08-20 DIAGNOSIS — F17210 Nicotine dependence, cigarettes, uncomplicated: Secondary | ICD-10-CM

## 2017-08-20 DIAGNOSIS — I10 Essential (primary) hypertension: Secondary | ICD-10-CM

## 2017-08-20 DIAGNOSIS — E669 Obesity, unspecified: Secondary | ICD-10-CM

## 2017-08-20 DIAGNOSIS — E785 Hyperlipidemia, unspecified: Secondary | ICD-10-CM

## 2017-08-20 DIAGNOSIS — Z79899 Other long term (current) drug therapy: Secondary | ICD-10-CM

## 2017-08-20 DIAGNOSIS — F32A Depression, unspecified: Secondary | ICD-10-CM

## 2017-08-20 DIAGNOSIS — F329 Major depressive disorder, single episode, unspecified: Secondary | ICD-10-CM

## 2017-08-20 DIAGNOSIS — Z6837 Body mass index (BMI) 37.0-37.9, adult: Secondary | ICD-10-CM

## 2017-08-20 MED ORDER — ROSUVASTATIN CALCIUM 20 MG PO TABS: 20.0000 mg | ORAL_TABLET | Freq: Every day | ORAL | 3 refills | Status: DC

## 2017-08-20 MED ORDER — FLUOXETINE HCL 40 MG PO CAPS: 40.0000 mg | ORAL_CAPSULE | Freq: Every day | ORAL | 0 refills | Status: DC

## 2017-08-20 MED ORDER — HYDROCHLOROTHIAZIDE 25 MG PO TABS: ORAL_TABLET | ORAL | 0 refills | Status: DC

## 2017-08-20 MED ORDER — LISINOPRIL 40 MG PO TABS: 40.0000 mg | ORAL_TABLET | Freq: Every day | ORAL | 0 refills | Status: DC

## 2017-08-20 MED FILL — ROSUVASTATIN CALCIUM 20 MG: 20 | 30 days supply | Qty: 30 | Fill #0

## 2017-08-20 MED FILL — HYDROCHLOROTHIAZIDE 25 MG T: 25 | 30 days supply | Qty: 30 | Fill #0

## 2017-08-20 MED FILL — LISINOPRIL 40 MG TABLET: 40 | 30 days supply | Qty: 30 | Fill #0

## 2017-08-20 MED FILL — FLUoxetine HCL 40 MG CAPS: 40 | 30 days supply | Qty: 30 | Fill #0

## 2017-08-20 NOTE — Assessment & Plan Note (Signed)
-   Refilled Crestor 20 mg QD

## 2017-08-20 NOTE — Patient Instructions (Signed)
It was nice to meet you today.  I sent prescriptions for Prozac, lisinopril, hydrochlorothiazide, and crestor to the [facility]. You should continue taking them as usual.   Make sure to make a follow up appointment with your regular doctor.   Please call if you have any questions.

## 2017-08-20 NOTE — Assessment & Plan Note (Signed)
Patient presents for hypertension follow-up.  Takes HCTZ 25 and lisinopril 40.  Ran out of her medication 3 days ago.  BP today 161/90.  Renal function normal, Cr 0.7 on 05/2017.  Denies headache, changes in vision, chest pain, shortness of breath, and lower extremity swelling.  - Refilled HCTZ and lisinopril - Advised to follow up with PCP for management of chronic medical problems

## 2017-08-20 NOTE — Progress Notes (Signed)
   CC: HTN, depression and HLD follow up   HPI:  [name] is a 52 y.o. female with PMH listed below who presents to clinic for hypertension, depression, and hyperlipidemia follow-up.  Please see problem based assessment and plan for further details.  Past Medical History:  Diagnosis Date  . Depression   . GERD (gastroesophageal reflux disease)   . HTN (hypertension)   . Hyperlipemia   . Leiomyoma of uterus    s/p hysterectomy, hx of uterine hemorrhage  . Tobacco abuse    Review of Systems:   Review of Systems  Constitutional: Negative for chills, diaphoresis, fever, malaise/fatigue and weight loss.  Respiratory: Negative for cough and shortness of breath.   Cardiovascular: Negative for chest pain, palpitations and leg swelling.  Gastrointestinal: Negative for abdominal pain, constipation, diarrhea, nausea and vomiting.  Neurological: Negative for dizziness and headaches.    Physical Exam:  Vitals:   08/20/17 1058  BP: (!) 160/90  Pulse: 98  Temp: 98 F (36.7 C)  TempSrc: Oral  SpO2: 100%  Weight: 225 lb 6.4 oz (102.2 kg)  Height: 5\' 5"  (1.651 m)   General: Very pleasant female, obese, well-developed, in no acute distress Cardiac: regular rate and rhythm, nl S1/S2, no murmurs, rubs or gallops  Pulm: CTAB, no wheezes or crackles, no increased work of breathing  Ext: warm and well perfused, no peripheral edema  Psych: attentive, appropriate affect, answers questions appropriately, nl speech and behavior, appropriate thought process     Assessment & Plan:   See Encounters Tab for problem based charting.  Patient discussed with Dr. Evette Doffing

## 2017-08-20 NOTE — Assessment & Plan Note (Signed)
Patient presents requesting refill for Prozac.  Reports her mood is stable.  Denies anhedonia, guilt, lack of energy, insomnia, changes in concentration or appetite, and SI.   - Continue Prozac 40 mg QD

## 2017-08-21 NOTE — Progress Notes (Signed)
Internal Medicine Clinic Attending  Case discussed with Dr. Santos-Sanchez at the time of the visit.  We reviewed the resident's history and exam and pertinent patient test results.  I agree with the assessment, diagnosis, and plan of care documented in the resident's note.    

## 2017-08-24 ENCOUNTER — Ambulatory Visit: Payer: Self-pay

## 2017-08-28 ENCOUNTER — Ambulatory Visit: Payer: Self-pay

## 2017-09-08 ENCOUNTER — Encounter: Payer: Self-pay | Admitting: Internal Medicine

## 2017-09-08 ENCOUNTER — Ambulatory Visit (INDEPENDENT_AMBULATORY_CARE_PROVIDER_SITE_OTHER): Payer: Self-pay | Admitting: Internal Medicine

## 2017-09-08 ENCOUNTER — Other Ambulatory Visit: Payer: Self-pay

## 2017-09-08 DIAGNOSIS — N644 Mastodynia: Secondary | ICD-10-CM | POA: Insufficient documentation

## 2017-09-08 DIAGNOSIS — E785 Hyperlipidemia, unspecified: Secondary | ICD-10-CM

## 2017-09-08 DIAGNOSIS — I10 Essential (primary) hypertension: Secondary | ICD-10-CM

## 2017-09-08 DIAGNOSIS — F329 Major depressive disorder, single episode, unspecified: Secondary | ICD-10-CM

## 2017-09-08 DIAGNOSIS — G8911 Acute pain due to trauma: Secondary | ICD-10-CM

## 2017-09-08 HISTORY — DX: Mastodynia: N64.4
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
## 2017-09-08 NOTE — Patient Instructions (Signed)
Thank you for coming to the clinic today. It was a pleasure to see you.   For your breast pain please continue to use the tylenol or ibuprofen and heat therapy.   FOLLOW-UP INSTRUCTIONS When: March 12th or 26th with Dr. Marlowe Sax  For: general check up and to check on the breast pain What to bring: all of your medication bottles   Please call our clinic if you have any questions or concerns, we may be able to help and keep you from a long and expensive emergency room wait. Our clinic and after hours phone number is [phone], there is always someone available.

## 2017-09-09 NOTE — Progress Notes (Signed)
Internal Medicine Clinic Attending  Case discussed with Dr. Blum at the time of the visit.  We reviewed the resident's history and exam and pertinent patient test results.  I agree with the assessment, diagnosis, and plan of care documented in the resident's note. 

## 2017-09-22 ENCOUNTER — Encounter: Payer: Self-pay | Admitting: Internal Medicine

## 2017-09-23 ENCOUNTER — Encounter: Payer: Self-pay | Admitting: Internal Medicine

## 2017-10-02 ENCOUNTER — Other Ambulatory Visit: Payer: Self-pay | Admitting: Internal Medicine

## 2017-10-02 DIAGNOSIS — F329 Major depressive disorder, single episode, unspecified: Secondary | ICD-10-CM

## 2017-10-02 DIAGNOSIS — I10 Essential (primary) hypertension: Secondary | ICD-10-CM

## 2017-10-02 DIAGNOSIS — F32A Depression, unspecified: Secondary | ICD-10-CM

## 2017-10-02 MED FILL — PANTOPRAZOLE SOD DR 40 MG T: 40 | 30 days supply | Qty: 30 | Fill #0

## 2017-10-02 MED FILL — HYDROCHLOROTHIAZIDE 25 MG T: 25 | 30 days supply | Qty: 30 | Fill #0

## 2017-10-02 MED FILL — ROSUVASTATIN CALCIUM 20 MG: 20 | 30 days supply | Qty: 30 | Fill #1

## 2017-10-02 MED FILL — FLUoxetine HCL 40 MG CAPS: 40 | 30 days supply | Qty: 30 | Fill #0

## 2017-10-02 MED FILL — LISINOPRIL 40 MG TABLET: 40 | 30 days supply | Qty: 30 | Fill #0

## 2017-11-20 ENCOUNTER — Other Ambulatory Visit: Payer: Self-pay | Admitting: Internal Medicine

## 2017-11-20 DIAGNOSIS — F32A Depression, unspecified: Secondary | ICD-10-CM

## 2017-11-20 DIAGNOSIS — F329 Major depressive disorder, single episode, unspecified: Secondary | ICD-10-CM

## 2017-11-20 DIAGNOSIS — I10 Essential (primary) hypertension: Secondary | ICD-10-CM

## 2018-01-14 ENCOUNTER — Encounter (HOSPITAL_COMMUNITY): Payer: Self-pay | Admitting: *Deleted

## 2018-01-14 ENCOUNTER — Other Ambulatory Visit: Payer: Self-pay

## 2018-01-14 ENCOUNTER — Ambulatory Visit (HOSPITAL_COMMUNITY)
Admission: EM | Admit: 2018-01-14 | Discharge: 2018-01-14 | Disposition: A | Discharge status: Home / Self Care | Payer: Self-pay | Attending: Family Medicine | Admitting: Family Medicine

## 2018-01-14 DIAGNOSIS — Z79899 Other long term (current) drug therapy: Secondary | ICD-10-CM | POA: Insufficient documentation

## 2018-01-14 DIAGNOSIS — F17210 Nicotine dependence, cigarettes, uncomplicated: Secondary | ICD-10-CM | POA: Insufficient documentation

## 2018-01-14 DIAGNOSIS — I10 Essential (primary) hypertension: Secondary | ICD-10-CM | POA: Insufficient documentation

## 2018-01-14 DIAGNOSIS — N760 Acute vaginitis: Secondary | ICD-10-CM | POA: Insufficient documentation

## 2018-01-14 MED ORDER — LISINOPRIL 40 MG PO TABS: 40.0000 mg | ORAL_TABLET | Freq: Every day | ORAL | 0 refills | Status: DC

## 2018-01-14 MED ORDER — HYDROCHLOROTHIAZIDE 25 MG PO TABS: ORAL_TABLET | ORAL | 0 refills | Status: DC

## 2018-01-14 NOTE — ED Triage Notes (Signed)
Reports having vaginal discharge x 2 wks; believes to be BV.  Denies any abd pain.  Also has been out of HTN x 2 wks and no longer has PCP.

## 2018-01-14 NOTE — ED Provider Notes (Signed)
[place]    CSN: [id number] Arrival date & time: 01/14/18  1305     History   Chief Complaint Chief Complaint  Patient presents with  . Vaginal Discharge  . Medication Refill    HPI Ann Stone is a 52 y.o. female.   The history is provided by the patient. No language interpreter was used.  Vaginal Discharge  Quality:  Milky (Milky color and thin, looks like her usual BV) Severity:  Moderate Onset quality:  Gradual Duration:  1 week Timing:  Constant Progression:  Worsening Chronicity:  Recurrent Context: not after intercourse and not after urination   Context comment:  Occurs whenever she switches soap. She just started using Dial soap Relieved by:  Nothing Worsened by:  Nothing Ineffective treatments:  None tried Associated symptoms: no abdominal pain, no dyspareunia, no dysuria, no fever, no genital lesions, no nausea, no urinary frequency, no urinary hesitancy, no vaginal itching and no vomiting   Risk factors: no new sexual partner, no STI and no unprotected sex   Risk factors comment:  With her husband of 5 yrs Hypertension  This is a chronic problem. Pertinent negatives include no chest pain, no abdominal pain, no headaches and no shortness of breath. Treatments tried: Lisinopril 40 mg qd and HCTZ 25 mg qd. Out of meds for 2 weeks. Need refill.    Past Medical History:  Diagnosis Date  . Depression   . GERD (gastroesophageal reflux disease)   . HTN (hypertension)   . Hyperlipemia   . Leiomyoma of uterus    s/p hysterectomy, hx of uterine hemorrhage  . Tobacco abuse     Patient Active Problem List   Diagnosis Date Noted  . Breast pain 09/08/2017  . Healthcare maintenance 10/06/2016  . Depression 07/22/2010  . Hyperlipidemia 08/02/2008  . SMOKER 06/29/2006  . Hypertension, essential 06/29/2006    Past Surgical History:  Procedure Laterality Date  . ABDOMINAL HYSTERECTOMY    . OPERATIVE HYSTEROSCOPY      OB History   None       Home Medications    Prior to Admission medications   Medication Sig Start Date End Date Taking? Authorizing Provider  hydrochlorothiazide (HYDRODIURIL) 25 MG tablet TAKE 1 TABLET BY MOUTH EVERY DAY. 10/02/17  Yes Shela Leff, MD  lisinopril (PRINIVIL,ZESTRIL) 40 MG tablet TAKE 1 TABLET BY MOUTH DAILY. 10/02/17  Yes Shela Leff, MD  ibuprofen (ADVIL,MOTRIN) 800 MG tablet Take 1 tablet (800 mg total) by mouth every 8 (eight) hours as needed. 08/17/17   Ward, Ozella Almond, PA-C  nicotine (NICODERM CQ - DOSED IN MG/24 HOURS) 21 mg/24hr patch Place 1 patch (21 mg total) onto the skin daily. IM program. 03/24/17 03/24/18  Shela Leff, MD  nicotine polacrilex (NICORETTE) 2 MG gum Chew 1 gum as needed for smoking cessation. Do not exceed 24 pieces of gum in a day. IM program. 03/24/17   Shela Leff, MD    Family History Family History  Problem Relation Age of Onset  . Cancer Mother   . Osteoarthritis Mother   . Cancer Father     Social History Social History   Tobacco Use  . Smoking status: Current Every Day Smoker    Packs/day: 1.00    Types: Cigarettes  . Smokeless tobacco: Never Used  Substance Use Topics  . Alcohol use: Yes    Comment: occasional  . Drug use: No     Allergies   Influenza vaccines and Metoprolol tartrate   Review  of Systems Review of Systems  Constitutional: Negative for fever.  Respiratory: Negative.  Negative for shortness of breath.   Cardiovascular: Negative.  Negative for chest pain.  Gastrointestinal: Negative.  Negative for abdominal pain, nausea and vomiting.  Genitourinary: Positive for vaginal discharge. Negative for dyspareunia, dysuria and hesitancy.  Neurological: Negative for headaches.  All other systems reviewed and are negative.    Physical Exam Triage Vital Signs ED Triage Vitals  Enc Vitals Group     BP 01/14/18 1334 (!) 143/95     Pulse Rate 01/14/18 1334 (!) 105     Resp 01/14/18 1334 18     Temp  01/14/18 1334 98.4 F (36.9 C)     Temp Source 01/14/18 1334 Oral     SpO2 01/14/18 1334 97 %     Weight --      Height --      Head Circumference --      Peak Flow --      Pain Score 01/14/18 1335 0     Pain Loc --      Pain Edu? --      Excl. in [place]? --    No data found.  Updated Vital Signs BP (!) 143/95   Pulse (!) 105   Temp 98.4 F (36.9 C) (Oral)   Resp 18   SpO2 97%   Visual Acuity Right Eye Distance:   Left Eye Distance:   Bilateral Distance:    Right Eye Near:   Left Eye Near:    Bilateral Near:     Physical Exam  Constitutional: She appears well-developed. No distress.  Cardiovascular: Normal rate, regular rhythm and normal heart sounds.  No murmur heard. Pulmonary/Chest: Effort normal and breath sounds normal. No stridor. No respiratory distress. She has no wheezes.  Abdominal: Soft. Bowel sounds are normal. She exhibits no mass. There is no tenderness.  Nursing note and vitals reviewed.    UC Treatments / Results  Labs (all labs ordered are listed, but only abnormal results are displayed) Labs Reviewed - No data to display  EKG None  Radiology No results found.  Procedures Procedures (including critical care time)  Medications Ordered in UC Medications - No data to display  Initial Impression / Assessment and Plan / UC Course  I have reviewed the triage vital signs and the nursing notes.  Pertinent labs & imaging results that were available during my care of the patient were reviewed by me and considered in my medical decision making (see chart for details).  Clinical Course as of Jan 14 1406  Thu Jan 14, 2018  1405 Vaginitis Wet prep collected. GC/Chlamydia checked. She can only use transvaginal Metronidazole which cost her $70 the last time. She will wait to confirm diagnosis before treatment. She will need diflucan as well as she usually develop yeast infection with antibiotic treatment.   [KE]  1406 I refilled Lisinopril and  HCTZ. F?U with PCP for BP management.   [KE]    Clinical Course User Index [KE] Kinnie Feil, MD    Vaginitis and vulvovaginitis  Essential hypertension  Hypertension, essential - Plan: hydrochlorothiazide (HYDRODIURIL) 25 MG tablet, lisinopril (PRINIVIL,ZESTRIL) 40 MG tablet   Final Clinical Impressions(s) / UC Diagnoses   Final diagnoses:  None   Discharge Instructions   None    ED Prescriptions    None     Controlled Substance Prescriptions [place] Controlled Substance Registry consulted? Not Applicable   Kinnie Feil, MD 01/14/18 1409

## 2018-01-14 NOTE — Discharge Instructions (Addendum)
Nice seeing you today. I refilled your BP meds. We collected your vaginal discharge sample for testing. I will call with result. Keep private area clean. See PCP soon for BP management.

## 2018-01-15 ENCOUNTER — Telehealth: Payer: Self-pay | Admitting: Family Medicine

## 2018-01-15 ENCOUNTER — Telehealth (HOSPITAL_COMMUNITY): Payer: Self-pay

## 2018-01-15 LAB — CERVICOVAGINAL ANCILLARY ONLY
BACTERIAL VAGINITIS: POSITIVE — AB
CANDIDA VAGINITIS: NEGATIVE
CHLAMYDIA, DNA PROBE: NEGATIVE
NEISSERIA GONORRHEA: NEGATIVE
Trichomonas: POSITIVE — AB

## 2018-01-15 MED ORDER — METRONIDAZOLE 500 MG PO TABS: 500.0000 mg | ORAL_TABLET | Freq: Two times a day (BID) | ORAL | 0 refills | Status: AC

## 2018-01-15 NOTE — Telephone Encounter (Signed)
Trichomonas is positive. Need to educate patient to refrain from sexual intercourse for 7 days to give the medicine time to work. Sexual partners need to be notified and tested/treated. Condoms may reduce risk of reinfection.  Recheck for further evaluation if symptoms are not improving. Pt verbalized understanding.   Bacterial vaginosis is positive. This was not treated at the urgent care visit.    Attempted to reach patient. No answer at this time. Voicemail left.   Provider note says patient cannot take oral Flagyl. Will speak with patient before ordering treatment to verify.

## 2018-01-15 NOTE — Telephone Encounter (Signed)
HIPAA compliant message left to call back. ++ Trich and BV. Prefers transvaginal Flagyl.  However, the best option for Trich is oral. Need to discuss with her. If she does not return call by tomorrow, I will call in oral Flagyl and send a certified letter to her home address.

## 2018-01-15 NOTE — Addendum Note (Signed)
Addended by: Andrena Mews T on: 01/15/2018 07:12 PM   Modules accepted: Orders

## 2018-01-15 NOTE — Telephone Encounter (Addendum)
I called back and was able to discuss result with her. She stated oral Flagyl does not seem to work well for her but she will try it. Denies s/e to Flagyl in the past.   She is advised to get her partner tested and treated. Avoid alcohol intake while on Flagyl. All questions were answered.  Advised to get retested in 3 weeks.  Will forward to [facility] RN to report result to the health department.

## 2018-05-07 ENCOUNTER — Ambulatory Visit (HOSPITAL_COMMUNITY)
Admission: EM | Admit: 2018-05-07 | Discharge: 2018-05-07 | Disposition: A | Discharge status: Home / Self Care | Payer: Self-pay | Attending: Family Medicine | Admitting: Family Medicine

## 2018-05-07 ENCOUNTER — Encounter (HOSPITAL_COMMUNITY): Payer: Self-pay

## 2018-05-07 DIAGNOSIS — M109 Gout, unspecified: Secondary | ICD-10-CM

## 2018-05-07 MED ORDER — PREDNISONE 20 MG PO TABS: 40.0000 mg | ORAL_TABLET | Freq: Every day | ORAL | 0 refills | Status: AC

## 2018-05-07 NOTE — Discharge Instructions (Signed)
Start prednisone as directed. If symptoms not completely resolved by the time you finish prednisone, you can take over the counter naproxen 440mg  twice a day, or follow up for recheck. Keep hydrated, your urine should be clear to pale yellow in color. Follow up with PCP for further evaluation needed.

## 2018-05-07 NOTE — ED Triage Notes (Signed)
Pt presents with left foot swelling and pain.

## 2018-05-07 NOTE — ED Provider Notes (Signed)
[place]    CSN: [id number] Arrival date & time: 05/07/18  1002     History   Chief Complaint Chief Complaint  Patient presents with  . Foot Pain    HPI Ann Stone is a 52 y.o. female.   52 year old female comes in for 2-3 day history of left foot pain.  States pain is surrounding the left great toe.  There is swelling and redness.  Has noticed that it is very painful to light touch, and has not been able to wear socks/shoes as normal.  States having trouble moving the toe as well as weightbearing due to the pain.  Denies fever, chills, night sweats.  Denies history of gout.  She takes HCTZ and lisinopril for hypertension and has not recently changed dosage.  States she did not eat red meat for over 7 years, and restarted 2 months ago.  Denies excessive alcohol use/drinking beer recently.     Past Medical History:  Diagnosis Date  . Depression   . GERD (gastroesophageal reflux disease)   . HTN (hypertension)   . Hyperlipemia   . Leiomyoma of uterus    s/p hysterectomy, hx of uterine hemorrhage  . Tobacco abuse     Patient Active Problem List   Diagnosis Date Noted  . Breast pain 09/08/2017  . Healthcare maintenance 10/06/2016  . Depression 07/22/2010  . Hyperlipidemia 08/02/2008  . SMOKER 06/29/2006  . Hypertension, essential 06/29/2006    Past Surgical History:  Procedure Laterality Date  . ABDOMINAL HYSTERECTOMY    . OPERATIVE HYSTEROSCOPY      OB History   None      Home Medications    Prior to Admission medications   Medication Sig Start Date End Date Taking? Authorizing Provider  hydrochlorothiazide (HYDRODIURIL) 25 MG tablet TAKE 1 TABLET BY MOUTH EVERY DAY. 01/14/18   Kinnie Feil, MD  ibuprofen (ADVIL,MOTRIN) 800 MG tablet Take 1 tablet (800 mg total) by mouth every 8 (eight) hours as needed. 08/17/17   Ward, Ozella Almond, PA-C  lisinopril (PRINIVIL,ZESTRIL) 40 MG tablet Take 1 tablet (40 mg total) by mouth daily. 01/14/18    Kinnie Feil, MD  nicotine polacrilex (NICORETTE) 2 MG gum Chew 1 gum as needed for smoking cessation. Do not exceed 24 pieces of gum in a day. IM program. 03/24/17   Shela Leff, MD  predniSONE (DELTASONE) 20 MG tablet Take 2 tablets (40 mg total) by mouth daily for 5 days. 05/07/18 05/12/18  Ok Edwards, PA-C    Family History Family History  Problem Relation Age of Onset  . Cancer Mother   . Osteoarthritis Mother   . Cancer Father     Social History Social History   Tobacco Use  . Smoking status: Current Every Day Smoker    Packs/day: 1.00    Types: Cigarettes  . Smokeless tobacco: Never Used  Substance Use Topics  . Alcohol use: Yes    Comment: occasional  . Drug use: No     Allergies   Influenza vaccines; Metoprolol tartrate; and Penicillins   Review of Systems Review of Systems  Reason unable to perform ROS: See HPI as above.     Physical Exam Triage Vital Signs ED Triage Vitals [05/07/18 1040]  Enc Vitals Group     BP (!) 157/92     Pulse Rate (!) 103     Resp 20     Temp 97.9 F (36.6 C)     Temp Source Oral  SpO2 95 %     Weight      Height      Head Circumference      Peak Flow      Pain Score 9     Pain Loc      Pain Edu?      Excl. in [place]?    No data found.  Updated Vital Signs BP (!) 157/92 (BP Location: Right Arm)   Pulse (!) 103   Temp 97.9 F (36.6 C) (Oral)   Resp 20   SpO2 95%   Physical Exam  Constitutional: She is oriented to person, place, and time. She appears well-developed and well-nourished. No distress.  HENT:  Head: Normocephalic and atraumatic.  Eyes: Pupils are equal, round, and reactive to light. Conjunctivae are normal.  Musculoskeletal:  Swelling, erythema, warmth to the left first MTP joint.  Tenderness to light touch.  No tenderness to palpation of great toe.  Decreased range of motion.  Sensation intact.  Pedal pulse 2+, cap refill less than 2 seconds.  Neurological: She is alert and oriented to  person, place, and time.  Skin: She is not diaphoretic.   UC Treatments / Results  Labs (all labs ordered are listed, but only abnormal results are displayed) Labs Reviewed - No data to display  EKG None  Radiology No results found.  Procedures Procedures (including critical care time)  Medications Ordered in UC Medications - No data to display  Initial Impression / Assessment and Plan / UC Course  I have reviewed the triage vital signs and the nursing notes.  Pertinent labs & imaging results that were available during my care of the patient were reviewed by me and considered in my medical decision making (see chart for details).    Will cover for gout with prednisone.  Push fluids.  Return precautions given.  Patient expresses understanding and agrees to plan.  Final Clinical Impressions(s) / UC Diagnoses   Final diagnoses:  Acute gout involving toe of left foot, unspecified cause    ED Prescriptions    Medication Sig Dispense Auth. Provider   predniSONE (DELTASONE) 20 MG tablet Take 2 tablets (40 mg total) by mouth daily for 5 days. 10 tablet Tobin Chad, PA-C 05/07/18 1144

## 2018-06-21 ENCOUNTER — Ambulatory Visit (INDEPENDENT_AMBULATORY_CARE_PROVIDER_SITE_OTHER): Payer: Self-pay | Admitting: Physician Assistant

## 2018-06-21 ENCOUNTER — Other Ambulatory Visit: Payer: Self-pay

## 2018-06-21 ENCOUNTER — Encounter (INDEPENDENT_AMBULATORY_CARE_PROVIDER_SITE_OTHER): Payer: Self-pay | Admitting: Physician Assistant

## 2018-06-21 VITALS — BP 145/85 | HR 106 | Temp 97.7°F | Ht 64.0 in | Wt 216.0 lb

## 2018-06-21 DIAGNOSIS — I10 Essential (primary) hypertension: Secondary | ICD-10-CM

## 2018-06-21 DIAGNOSIS — Z1239 Encounter for other screening for malignant neoplasm of breast: Secondary | ICD-10-CM

## 2018-06-21 DIAGNOSIS — Z1211 Encounter for screening for malignant neoplasm of colon: Secondary | ICD-10-CM

## 2018-06-21 DIAGNOSIS — F17200 Nicotine dependence, unspecified, uncomplicated: Secondary | ICD-10-CM

## 2018-06-21 MED ORDER — AMLODIPINE BESYLATE 5 MG PO TABS: 5.0000 mg | ORAL_TABLET | Freq: Every day | ORAL | 3 refills | Status: DC

## 2018-06-21 MED ORDER — HYDROCHLOROTHIAZIDE 12.5 MG PO TABS: 12.5000 mg | ORAL_TABLET | Freq: Every day | ORAL | 3 refills | Status: DC

## 2018-06-21 NOTE — Patient Instructions (Signed)
Hypertension Hypertension, commonly called high blood pressure, is when the force of blood pumping through the arteries is too strong. The arteries are the blood vessels that carry blood from the heart throughout the body. Hypertension forces the heart to work harder to pump blood and may cause arteries to become narrow or stiff. Having untreated or uncontrolled hypertension can cause heart attacks, strokes, kidney disease, and other problems. A blood pressure reading consists of a higher number over a lower number. Ideally, your blood pressure should be below 120/80. The first ("top") number is called the systolic pressure. It is a measure of the pressure in your arteries as your heart beats. The second ("bottom") number is called the diastolic pressure. It is a measure of the pressure in your arteries as the heart relaxes. What are the causes? The cause of this condition is not known. What increases the risk? Some risk factors for high blood pressure are under your control. Others are not. Factors you can change  Smoking.  Having type 2 diabetes mellitus, high cholesterol, or both.  Not getting enough exercise or physical activity.  Being overweight.  Having too much fat, sugar, calories, or salt (sodium) in your diet.  Drinking too much alcohol. Factors that are difficult or impossible to change  Having chronic kidney disease.  Having a family history of high blood pressure.  Age. Risk increases with age.  Race. You may be at higher risk if you are African-American.  Gender. Men are at higher risk than women before age 45. After age 65, women are at higher risk than men.  Having obstructive sleep apnea.  Stress. What are the signs or symptoms? Extremely high blood pressure (hypertensive crisis) may cause:  Headache.  Anxiety.  Shortness of breath.  Nosebleed.  Nausea and vomiting.  Severe chest pain.  Jerky movements you cannot control (seizures).  How is this  diagnosed? This condition is diagnosed by measuring your blood pressure while you are seated, with your arm resting on a surface. The cuff of the blood pressure monitor will be placed directly against the skin of your upper arm at the level of your heart. It should be measured at least twice using the same arm. Certain conditions can cause a difference in blood pressure between your right and left arms. Certain factors can cause blood pressure readings to be lower or higher than normal (elevated) for a short period of time:  When your blood pressure is higher when you are in a health care provider's office than when you are at home, this is called white coat hypertension. Most people with this condition do not need medicines.  When your blood pressure is higher at home than when you are in a health care provider's office, this is called masked hypertension. Most people with this condition may need medicines to control blood pressure.  If you have a high blood pressure reading during one visit or you have normal blood pressure with other risk factors:  You may be asked to return on a different day to have your blood pressure checked again.  You may be asked to monitor your blood pressure at home for 1 week or longer.  If you are diagnosed with hypertension, you may have other blood or imaging tests to help your health care provider understand your overall risk for other conditions. How is this treated? This condition is treated by making healthy lifestyle changes, such as eating healthy foods, exercising more, and reducing your alcohol intake. Your   health care provider may prescribe medicine if lifestyle changes are not enough to get your blood pressure under control, and if:  Your systolic blood pressure is above 130.  Your diastolic blood pressure is above 80.  Your personal target blood pressure may vary depending on your medical conditions, your age, and other factors. Follow these  instructions at home: Eating and drinking  Eat a diet that is high in fiber and potassium, and low in sodium, added sugar, and fat. An example eating plan is called the DASH (Dietary Approaches to Stop Hypertension) diet. To eat this way: ? Eat plenty of fresh fruits and vegetables. Try to fill half of your plate at each meal with fruits and vegetables. ? Eat whole grains, such as whole wheat pasta, brown rice, or whole grain bread. Fill about one quarter of your plate with whole grains. ? Eat or drink low-fat dairy products, such as skim milk or low-fat yogurt. ? Avoid fatty cuts of meat, processed or cured meats, and poultry with skin. Fill about one quarter of your plate with lean proteins, such as fish, chicken without skin, beans, eggs, and tofu. ? Avoid premade and processed foods. These tend to be higher in sodium, added sugar, and fat.  Reduce your daily sodium intake. Most people with hypertension should eat less than 1,500 mg of sodium a day.  Limit alcohol intake to no more than 1 drink a day for nonpregnant women and 2 drinks a day for men. One drink equals 12 oz of beer, 5 oz of wine, or 1 oz of hard liquor. Lifestyle  Work with your health care provider to maintain a healthy body weight or to lose weight. Ask what an ideal weight is for you.  Get at least 30 minutes of exercise that causes your heart to beat faster (aerobic exercise) most days of the week. Activities may include walking, swimming, or biking.  Include exercise to strengthen your muscles (resistance exercise), such as pilates or lifting weights, as part of your weekly exercise routine. Try to do these types of exercises for 30 minutes at least 3 days a week.  Do not use any products that contain nicotine or tobacco, such as cigarettes and e-cigarettes. If you need help quitting, ask your health care provider.  Monitor your blood pressure at home as told by your health care provider.  Keep all follow-up visits as  told by your health care provider. This is important. Medicines  Take over-the-counter and prescription medicines only as told by your health care provider. Follow directions carefully. Blood pressure medicines must be taken as prescribed.  Do not skip doses of blood pressure medicine. Doing this puts you at risk for problems and can make the medicine less effective.  Ask your health care provider about side effects or reactions to medicines that you should watch for. Contact a health care provider if:  You think you are having a reaction to a medicine you are taking.  You have headaches that keep coming back (recurring).  You feel dizzy.  You have swelling in your ankles.  You have trouble with your vision. Get help right away if:  You develop a severe headache or confusion.  You have unusual weakness or numbness.  You feel faint.  You have severe pain in your chest or abdomen.  You vomit repeatedly.  You have trouble breathing. Summary  Hypertension is when the force of blood pumping through your arteries is too strong. If this condition is not   controlled, it may put you at risk for serious complications.  Your personal target blood pressure may vary depending on your medical conditions, your age, and other factors. For most people, a normal blood pressure is less than 120/80.  Hypertension is treated with lifestyle changes, medicines, or a combination of both. Lifestyle changes include weight loss, eating a healthy, low-sodium diet, exercising more, and limiting alcohol. This information is not intended to replace advice given to you by your health care provider. Make sure you discuss any questions you have with your health care provider. Document Released: 06/30/2005 Document Revised: 05/28/2016 Document Reviewed: 05/28/2016 Elsevier Interactive Patient Education  2018 [employer].   Tobacco Use Disorder Tobacco use disorder (TUD) is a mental disorder. It is the  long-term use of tobacco in spite of related health problems or difficulty with normal life activities. Tobacco is most commonly smoked as cigarettes and less commonly as cigars or pipes. Smokeless chewing tobacco and snuff are also popular. People with TUD get a feeling of extreme pleasure (euphoria) from using tobacco and have a desire to use it again and again. Repeated use of tobacco can cause problems. The addictive effects of tobacco are due mainly tothe ingredient nicotine. Nicotine also causes a rush of adrenaline (epinephrine) in the body. This leads to increased blood pressure, heart rate, and breathing rate. These changes may cause problems for people with high blood pressure, weak hearts, or lung disease. High doses of nicotine in children and pets can lead to seizures and death. Tobacco contains a number of other unsafe chemicals. These chemicals are especially harmful when inhaled as smoke and can damage almost every organ in the body. Smokers live shorter lives than nonsmokers and are at risk of dying from a number of diseases and cancers. Tobacco smoke can also cause health problems for nonsmokers (due to inhaling secondhand smoke). Smoking is also a fire hazard. TUD usually starts in the late teenage years and is most common in young adults between the ages of 64 and 74 years. People who start smoking earlier in life are more likely to continue smoking as adults. TUD is somewhat more common in men than women. People with TUD are at higher risk for using alcohol and other drugs of abuse. What increases the risk? Risk factors for TUD include:  Having family members with the disorder.  Being around people who use tobacco.  Having an existing mental health issue such as schizophrenia, depression, bipolar disorder, ADHD, or posttraumatic stress disorder (PTSD).  What are the signs or symptoms? People with tobacco use disorder have two or more of the following signs and symptoms within 12  months:  Use of more tobacco over a longer period than intended.  Not able to cut down or control tobacco use.  A lot of time spent obtaining or using tobacco.  Strong desire or urge to use tobacco (craving). Cravings may last for 6 months or longer after quitting.  Use of tobacco even when use leads to major problems at work, school, or home.  Use of tobacco even when use leads to relationship problems.  Giving up or cutting down on important life activities because of tobacco use.  Repeatedly using tobacco in situations where it puts you or others in physical danger, like smoking in bed.  Use of tobacco even when it is known that a physical or mental problem is likely related to tobacco use. ? Physical problems are numerous and may include chronic bronchitis, emphysema, lung and  other cancers, gum disease, high blood pressure, heart disease, and stroke. ? Mental problems caused by tobacco may include difficulty sleeping and anxiety.  Need to use greater amounts of tobacco to get the same effect. This means you have developed a tolerance.  Withdrawal symptoms as a result of stopping or rapidly cutting back use. These symptoms may last a month or more after quitting and include the following: ? Depressed, anxious, or irritable mood. ? Difficulty concentrating. ? Increased appetite. ? Restlessness or trouble sleeping. ? Use of tobacco to avoid withdrawal symptoms.  How is this diagnosed? Tobacco use disorder is diagnosed by your health care provider. A diagnosis may be made by:  Your health care provider asking questions about your tobacco use and any problems it may be causing.  A physical exam.  Lab tests.  You may be referred to a mental health professional or addiction specialist.  The severity of tobacco use disorder depends on the number of signs and symptoms you have:  Mild-Two or three symptoms.  Moderate-Four or five symptoms.  Severe-Six or more  symptoms.  How is this treated? Many people with tobacco use disorder are unable to quit on their own and need help. Treatment options include the following:  Nicotine replacement therapy (NRT). NRT provides nicotine without the other harmful chemicals in tobacco. NRT gradually lowers the dosage of nicotine in the body and reduces withdrawal symptoms. NRT is available in over-the-counter forms (gum, lozenges, and skin patches) as well as prescription forms (mouth inhaler and nasal spray).  Medicines.This may include: ? Antidepressant medicine that may reduce nicotine cravings. ? A medicine that acts on nicotine receptors in the brain to reduce cravings and withdrawal symptoms. It may also block the effects of tobacco in people with TUD who relapse.  Counseling or talk therapy. A form of talk therapy called behavioral therapy is commonly used to treat people with TUD. Behavioral therapy looks at triggers for tobacco use, how to avoid them, and how to cope with cravings. It is most effective in person or by phone but is also available in self-help forms (books and Internet websites).  Support groups. These provide emotional support, advice, and guidance for quitting tobacco.  The most effective treatment for TUD is usually a combination of medicine, talk therapy, and support groups. Follow these instructions at home:  Keep all follow-up visits as directed by your health care provider. This is important.  Take medicines only as directed by your health care provider.  Check with your health care provider before starting new prescription or over-the-counter medicines. Contact a health care provider if:  You are not able to take your medicines as prescribed.  Treatment is not helping your TUD and your symptoms get worse. Get help right away if:  You have serious thoughts about hurting yourself or others.  You have trouble breathing, chest pain, sudden weakness, or sudden numbness in part  of your body. This information is not intended to replace advice given to you by your health care provider. Make sure you discuss any questions you have with your health care provider. Document Released: 03/05/2004 Document Revised: 03/02/2016 Document Reviewed: 08/26/2013 Elsevier Interactive Patient Education  [employer].

## 2018-06-21 NOTE — Progress Notes (Signed)
Subjective:  Patient ID: Ann Stone, female    DOB: [birth year]  Age: 52 y.o. MRN: [id number]  CC: HTN  HPI Ann Stone is a 52 y.o. female with a medical history of depression, HTN, HLD, Gout, GERD, tobacco abuse, and TMJ presents as a new patient for HTN management. Has run out of HCTZ and Lisinopril since three to four weeks ago. Would like a refill. BP 179/105 mmHg today. Still smoking. Has bought nicotine patches but has not begun to use them yet. Denies CP, palpitations, PND, orthopnea, presyncope, syncope, LE edema, SOB, HA, tingling, numbness, abdominal pain, f/c/n/v, rash, or GI/GU sxs.        Outpatient Medications Prior to Visit  Medication Sig Dispense Refill  . hydrochlorothiazide (HYDRODIURIL) 25 MG tablet TAKE 1 TABLET BY MOUTH EVERY DAY. (Patient not taking: Reported on 06/21/2018) 30 tablet 0  . ibuprofen (ADVIL,MOTRIN) 800 MG tablet Take 1 tablet (800 mg total) by mouth every 8 (eight) hours as needed. (Patient not taking: Reported on 06/21/2018) 21 tablet 0  . lisinopril (PRINIVIL,ZESTRIL) 40 MG tablet Take 1 tablet (40 mg total) by mouth daily. (Patient not taking: Reported on 06/21/2018) 30 tablet 0  . nicotine polacrilex (NICORETTE) 2 MG gum Chew 1 gum as needed for smoking cessation. Do not exceed 24 pieces of gum in a day. IM program. (Patient not taking: Reported on 06/21/2018) 100 tablet 2   No facility-administered medications prior to visit.      ROS Review of Systems  Constitutional: Negative for chills, fever and malaise/fatigue.  Eyes: Negative for blurred vision.  Respiratory: Negative for shortness of breath.   Cardiovascular: Negative for chest pain and palpitations.  Gastrointestinal: Negative for abdominal pain and nausea.  Genitourinary: Negative for dysuria and hematuria.  Musculoskeletal: Negative for joint pain and myalgias.  Skin: Negative for rash.  Neurological: Negative for tingling and headaches.  Psychiatric/Behavioral: Negative for  depression. The patient is not nervous/anxious.     Objective:  BP (!) 179/105 (BP Location: Right Arm, Patient Position: Sitting, Cuff Size: Normal)   Pulse (!) 108   Temp 97.7 F (36.5 C) (Oral)   Ht 5\' 4"  (1.626 m)   Wt 216 lb (98 kg)   SpO2 98%   BMI 37.08 kg/m   BP/Weight 06/21/2018 84/16/6063 0/07/6008  Systolic BP 932 355 732  Diastolic BP 202 92 95  Wt. (Lbs) 216 - -  BMI 37.08 - -      Physical Exam  Constitutional: She is oriented to person, place, and time.  Well developed, well nourished, NAD, polite  HENT:  Head: Normocephalic and atraumatic.  Eyes: No scleral icterus.  Neck: Normal range of motion. Neck supple. No thyromegaly present.  Cardiovascular: Normal rate, regular rhythm and normal heart sounds.  No LE edema bilaterally, no carotid bruit bilaterally  Pulmonary/Chest: Effort normal and breath sounds normal.  Musculoskeletal: She exhibits no edema.  Neurological: She is alert and oriented to person, place, and time.  Skin: Skin is warm and dry. No rash noted. No erythema. No pallor.  Psychiatric: She has a normal mood and affect. Her behavior is normal. Thought content normal.  Vitals reviewed.    Assessment & Stone:    1. Hypertension, essential - Begin amLODipine (NORVASC) 5 MG tablet; Take 1 tablet (5 mg total) by mouth daily.  Dispense: 90 tablet; Refill: 3 - Begin hydrochlorothiazide (HYDRODIURIL) 12.5 MG tablet; Take 1 tablet (12.5 mg total) by mouth daily.  Dispense: 90 tablet; Refill: 3 -  Thyroid Panel With TSH - Comprehensive metabolic panel - CBC with Differential - Lipid panel  2. Tobacco use disorder - Pt advised to begin using the nicotine patches she bought. Pt aware of the risks of smoking and the possible consequences to include cancer and death.  3. Screening for breast cancer - MS DIGITAL SCREENING BILATERAL; Future  4. Special screening for malignant neoplasms, colon - Fecal occult blood, imunochemical; Future   Meds  ordered this encounter  Medications  . amLODipine (NORVASC) 5 MG tablet    Sig: Take 1 tablet (5 mg total) by mouth daily.    Dispense:  90 tablet    Refill:  3    Order Specific Question:   Supervising Provider    Answer:   Charlott Rakes [[id number]]  . hydrochlorothiazide (HYDRODIURIL) 12.5 MG tablet    Sig: Take 1 tablet (12.5 mg total) by mouth daily.    Dispense:  90 tablet    Refill:  3    Order Specific Question:   Supervising Provider    Answer:   Charlott Rakes [[id number]]    Follow-up: Return in about 4 weeks (around 07/19/2018) for HTN.   Clent Demark PA

## 2018-06-22 ENCOUNTER — Telehealth (INDEPENDENT_AMBULATORY_CARE_PROVIDER_SITE_OTHER): Payer: Self-pay

## 2018-06-22 ENCOUNTER — Other Ambulatory Visit (INDEPENDENT_AMBULATORY_CARE_PROVIDER_SITE_OTHER): Payer: Self-pay | Admitting: Physician Assistant

## 2018-06-22 DIAGNOSIS — E782 Mixed hyperlipidemia: Secondary | ICD-10-CM

## 2018-06-22 LAB — LIPID PANEL
CHOL/HDL RATIO: 10.2 ratio — AB (ref 0.0–4.4)
Cholesterol, Total: 317 mg/dL — ABNORMAL HIGH (ref 100–199)
HDL: 31 mg/dL — AB (ref >39)
LDL Calculated: 235 mg/dL — ABNORMAL HIGH (ref 0–99)
TRIGLYCERIDES: 253 mg/dL — AB (ref 0–149)
VLDL CHOLESTEROL CAL: 51 mg/dL — AB (ref 5–40)

## 2018-06-22 LAB — CBC WITH DIFFERENTIAL/PLATELET
BASOS: 1 %
Basophils Absolute: 0.1 10*3/uL (ref 0.0–0.2)
EOS (ABSOLUTE): 0.3 10*3/uL (ref 0.0–0.4)
EOS: 4 %
HEMATOCRIT: 43.1 % (ref 34.0–46.6)
Hemoglobin: 14.6 g/dL (ref 11.1–15.9)
IMMATURE GRANS (ABS): 0 10*3/uL (ref 0.0–0.1)
IMMATURE GRANULOCYTES: 0 %
LYMPHS: 53 %
Lymphocytes Absolute: 4.8 10*3/uL — ABNORMAL HIGH (ref 0.7–3.1)
MCH: 31.9 pg (ref 26.6–33.0)
MCHC: 33.9 g/dL (ref 31.5–35.7)
MCV: 94 fL (ref 79–97)
MONOCYTES: 7 %
Monocytes Absolute: 0.6 10*3/uL (ref 0.1–0.9)
NEUTROS PCT: 35 %
Neutrophils Absolute: 3.1 10*3/uL (ref 1.4–7.0)
Platelets: 342 10*3/uL (ref 150–450)
RBC: 4.57 x10E6/uL (ref 3.77–5.28)
RDW: 12.3 % (ref 12.3–15.4)
WBC: 8.8 10*3/uL (ref 3.4–10.8)

## 2018-06-22 LAB — COMPREHENSIVE METABOLIC PANEL
ALK PHOS: 68 IU/L (ref 39–117)
ALT: 14 IU/L (ref 0–32)
AST: 14 IU/L (ref 0–40)
Albumin/Globulin Ratio: 1.3 (ref 1.2–2.2)
Albumin: 4.2 g/dL (ref 3.5–5.5)
BUN/Creatinine Ratio: 11 (ref 9–23)
BUN: 8 mg/dL (ref 6–24)
Bilirubin Total: 0.3 mg/dL (ref 0.0–1.2)
CO2: 27 mmol/L (ref 20–29)
CREATININE: 0.7 mg/dL (ref 0.57–1.00)
Calcium: 10.1 mg/dL (ref 8.7–10.2)
Chloride: 96 mmol/L (ref 96–106)
GFR calc Af Amer: 117 mL/min/{1.73_m2} (ref >59)
GFR, EST NON AFRICAN AMERICAN: 101 mL/min/{1.73_m2} (ref >59)
GLOBULIN, TOTAL: 3.3 g/dL (ref 1.5–4.5)
GLUCOSE: 106 mg/dL — AB (ref 65–99)
Potassium: 4.2 mmol/L (ref 3.5–5.2)
SODIUM: 139 mmol/L (ref 134–144)
Total Protein: 7.5 g/dL (ref 6.0–8.5)

## 2018-06-22 LAB — THYROID PANEL WITH TSH
Free Thyroxine Index: >4.6 (ref 1.2–4.9)
T4, Total: 8.2 ug/dL (ref 4.5–12.0)
TSH: 1.5 u[IU]/mL (ref 0.450–4.500)

## 2018-06-22 MED ORDER — ATORVASTATIN CALCIUM 40 MG PO TABS: 40.0000 mg | ORAL_TABLET | Freq: Every day | ORAL | 3 refills | Status: DC

## 2018-06-22 NOTE — Telephone Encounter (Signed)
-----   Message from Clent Demark, PA-C sent at 06/22/2018 11:59 AM EST ----- Cholesterol elevated. Rest of labs unremarkable. I have sent atorvastatin to [facility].

## 2018-06-22 NOTE — Telephone Encounter (Signed)
Left message notifying patient that cholesterol is elevated and atorvastatin sent to [facility]. All other labs normal. Nat Christen, CMA

## 2018-07-26 ENCOUNTER — Ambulatory Visit (INDEPENDENT_AMBULATORY_CARE_PROVIDER_SITE_OTHER): Payer: Self-pay | Admitting: Internal Medicine

## 2018-10-11 ENCOUNTER — Telehealth: Payer: Self-pay | Admitting: Physician Assistant

## 2018-10-11 NOTE — Telephone Encounter (Signed)
1) Medication(s) Requested (by name): prozac 2) Pharmacy of Choice:

## 2018-10-12 ENCOUNTER — Ambulatory Visit (INDEPENDENT_AMBULATORY_CARE_PROVIDER_SITE_OTHER): Payer: Self-pay | Admitting: Primary Care

## 2018-10-12 NOTE — Telephone Encounter (Signed)
Pt will need an OV to get this medication.  Would you prefer a phone visit or office visit for this patient.

## 2018-10-13 NOTE — Telephone Encounter (Signed)
Refill can be given seen in 12/19 you can not abruptly stop SSRI

## 2018-10-13 NOTE — Telephone Encounter (Signed)
Error medication not seen in records patient must be seen in office. Ann Stone

## 2018-11-01 ENCOUNTER — Encounter: Payer: Self-pay | Admitting: Primary Care

## 2018-11-01 ENCOUNTER — Ambulatory Visit (INDEPENDENT_AMBULATORY_CARE_PROVIDER_SITE_OTHER): Payer: Self-pay | Admitting: Primary Care

## 2018-11-01 ENCOUNTER — Ambulatory Visit: Payer: Self-pay | Attending: Primary Care | Admitting: Primary Care

## 2018-11-01 ENCOUNTER — Other Ambulatory Visit: Payer: Self-pay

## 2018-11-01 DIAGNOSIS — F419 Anxiety disorder, unspecified: Secondary | ICD-10-CM

## 2018-11-01 DIAGNOSIS — F320 Major depressive disorder, single episode, mild: Secondary | ICD-10-CM

## 2018-11-01 DIAGNOSIS — I10 Essential (primary) hypertension: Secondary | ICD-10-CM

## 2018-11-01 NOTE — Progress Notes (Signed)
Patient verified DOB Patient has taken BP medication today. Patient has eaten today. Patient denies pain. Patient previously on Prozac from 2017-2019.  Patient had to establish with a primary in order to continue to receive the refills.

## 2018-11-01 NOTE — Progress Notes (Signed)
Virtual Visit via Telephone Note  I connected with Ann Stone on 11/01/18 at 4:50  by telephone and verified that I am speaking with the correct person using two identifiers.   I discussed the limitations, risks, security and privacy concerns of performing an evaluation and management service by telephone and the availability of in person appointments. I also discussed with the patient that there may be a patient responsible charge related to this service. The patient expressed understanding and agreed to proceed.   History of Present Illness: Ann Stone has a medical history of depression, HTN, HLD, Gout, GERD, tobacco abuse, and TMJ. She is following up on Bp and needing medication refilled.    Observations/Objective: Review of Systems  Constitutional: Positive for fatigue.  HENT: Negative.   Eyes: Negative.   Respiratory: Negative.   Cardiovascular: Positive for palpitations.  Gastrointestinal: Negative.   Genitourinary: Negative.   Musculoskeletal: Negative.   Skin: Negative.   Neurological: Positive for headaches.  Endo/Heme/Allergies: Negative.   Psychiatric/Behavioral: Positive for depression. The patient is nervous/anxious and has insomnia.    There are no diagnoses linked to this encounter.  There are no diagnoses linked to this encounter. Assessment and Plan: Hypertension  Associated symptoms include anxiety, headaches and palpitations.  Anxiety  Presents for follow-up visit. Symptoms include depressed mood, insomnia, nervous/anxious behavior and palpitations. Symptoms occur constantly. The severity of symptoms is moderate. The quality of sleep is poor. Nighttime awakenings: one to two.   Compliance with medications is 0-25%. Side effects of treatment include headaches.  Depression         This is a recurrent problem.  The current episode started 1 to 4 weeks ago.   The onset quality is gradual.   The problem occurs daily.  The problem has been gradually worsening  since onset.  Associated symptoms include fatigue, hopelessness, insomnia and headaches.     The symptoms are aggravated by work stress.  Compliance with treatment is poor.  Past compliance problems include insurance issues.  Previous treatment provided moderate relief.  Past medical history includes anxiety.     Follow Up Instructions:    I discussed the assessment and treatment plan with the patient. The patient was provided an opportunity to ask questions and all were answered. The patient agreed with the plan and demonstrated an understanding of the instructions.   The patient was advised to call back or seek an in-person evaluation if the symptoms worsen or if the condition fails to improve as anticipated.  I provided 22  minutes of non-face-to-face time during this encounter.   Kerin Perna, NP

## 2018-11-08 ENCOUNTER — Telehealth: Payer: Self-pay | Admitting: Licensed Clinical Social Worker

## 2018-11-08 NOTE — Telephone Encounter (Signed)
Call placed to patient to follow up on behavioral health referral from PCP. LCSWA introduced self and explained role at [facility]. Pt was informed of consult to address mental health. Pt agreed to schedule telephone appointment for Thursday, November 11, 2018. No additional concerns noted.

## 2018-11-11 ENCOUNTER — Institutional Professional Consult (permissible substitution): Payer: Self-pay | Admitting: Licensed Clinical Social Worker

## 2018-11-17 ENCOUNTER — Institutional Professional Consult (permissible substitution): Payer: Self-pay | Admitting: Licensed Clinical Social Worker

## 2018-11-23 ENCOUNTER — Institutional Professional Consult (permissible substitution): Payer: Self-pay | Admitting: Licensed Clinical Social Worker

## 2019-09-12 ENCOUNTER — Encounter: Payer: Self-pay | Admitting: Internal Medicine

## 2019-09-12 ENCOUNTER — Ambulatory Visit: Payer: Self-pay | Admitting: Internal Medicine

## 2019-09-12 VITALS — BP 144/90 | HR 98 | Temp 99.4°F | Ht 65.0 in | Wt 243.6 lb

## 2019-09-12 DIAGNOSIS — E782 Mixed hyperlipidemia: Secondary | ICD-10-CM

## 2019-09-12 DIAGNOSIS — Z1211 Encounter for screening for malignant neoplasm of colon: Secondary | ICD-10-CM

## 2019-09-12 DIAGNOSIS — F320 Major depressive disorder, single episode, mild: Secondary | ICD-10-CM

## 2019-09-12 DIAGNOSIS — E785 Hyperlipidemia, unspecified: Secondary | ICD-10-CM

## 2019-09-12 DIAGNOSIS — F329 Major depressive disorder, single episode, unspecified: Secondary | ICD-10-CM

## 2019-09-12 DIAGNOSIS — Z23 Encounter for immunization: Secondary | ICD-10-CM

## 2019-09-12 DIAGNOSIS — Z87891 Personal history of nicotine dependence: Secondary | ICD-10-CM

## 2019-09-12 DIAGNOSIS — Z0000 Encounter for general adult medical examination without abnormal findings: Secondary | ICD-10-CM

## 2019-09-12 DIAGNOSIS — I10 Essential (primary) hypertension: Secondary | ICD-10-CM

## 2019-09-12 DIAGNOSIS — Z1239 Encounter for other screening for malignant neoplasm of breast: Secondary | ICD-10-CM

## 2019-09-12 MED ORDER — ATORVASTATIN CALCIUM 40 MG PO TABS: 40.0000 mg | ORAL_TABLET | Freq: Every day | ORAL | 3 refills | Status: DC

## 2019-09-12 MED ORDER — LISINOPRIL 40 MG PO TABS: 20.0000 mg | ORAL_TABLET | Freq: Every day | ORAL | 1 refills | Status: DC

## 2019-09-12 MED ORDER — FLUOXETINE HCL 40 MG PO CAPS: 40.0000 mg | ORAL_CAPSULE | Freq: Every day | ORAL | 1 refills | Status: DC

## 2019-09-12 MED ORDER — HYDROCHLOROTHIAZIDE 25 MG PO TABS: 12.5000 mg | ORAL_TABLET | Freq: Every day | ORAL | 1 refills | Status: DC

## 2019-09-12 NOTE — Patient Instructions (Addendum)
Thank you for allowing [place] to care for you  For your Hypertension - BP elevated today, while off medications - We will restart lisinopril and HCTZ at half doses - Follow up in 3 weeks for recheck - Lab work today  We will restart cholesterol medication and antidepressant  Mammogram ordered  TDAP Vaccine given  Follow up in about 3 weeks

## 2019-09-12 NOTE — Progress Notes (Signed)
   CC: Re-establish care, Hypertension, Hyperlipidemia, Tobacco Use, Depression, Preventative health Care  HPI:  [name] is a 54 y.o. F with PMHx listed below presenting for Re-establish care, Hypertension, Hyperlipidemia, Tobacco Use, Depression, Preventative health Care. Please see the A&P for the status of the patient's chronic medical problems.   Past Medical History:  Diagnosis Date  . Breast pain 09/08/2017  . Depression   . GERD (gastroesophageal reflux disease)   . HTN (hypertension)   . Hyperlipemia   . Leiomyoma of uterus    s/p hysterectomy, hx of uterine hemorrhage  . Tobacco abuse     Review of Systems:  Performed and all others negative.  Physical Exam:  Vitals:   09/12/19 1327  BP: (!) 184/109  Pulse: 100  Temp: 99.4 F (37.4 C)  TempSrc: Oral  SpO2: 100%  Weight: 243 lb 9.6 oz (110.5 kg)  Height: 5\' 5"  (1.651 m)   Physical Exam Constitutional:      General: She is not in acute distress.    Appearance: Normal appearance. She is obese.  Cardiovascular:     Rate and Rhythm: Normal rate and regular rhythm.     Pulses: Normal pulses.     Heart sounds: Normal heart sounds.  Pulmonary:     Effort: Pulmonary effort is normal. No respiratory distress.     Breath sounds: Normal breath sounds.  Abdominal:     General: Bowel sounds are normal. There is no distension.     Palpations: Abdomen is soft.     Tenderness: There is no abdominal tenderness.  Musculoskeletal:        General: No swelling or deformity.  Skin:    General: Skin is warm and dry.  Neurological:     General: No focal deficit present.     Mental Status: Mental status is at baseline.    Assessment & Plan:   See Encounters Tab for problem based charting.  Patient discussed with Dr. Lynnae January

## 2019-09-12 NOTE — Assessment & Plan Note (Addendum)
-   Mammogram Ordered - Flu declined - TDAP given - FOBT for colon screen deferred until orange card

## 2019-09-12 NOTE — Assessment & Plan Note (Addendum)
Patient reports she quit smoking about 2 years ago, she was congratulated on this.

## 2019-09-12 NOTE — Assessment & Plan Note (Addendum)
Sine her last visit with [place] her dose of atorvastatin was increased to 40mg  Daily, but has since stopped taking her medication, will restart at 40mg  Daily. - Atorvastatin 40mg  Daily

## 2019-09-12 NOTE — Assessment & Plan Note (Addendum)
BP today 184/109 and 144/90 on repeat at end of visit (unsure which is most accurate, likely in between). She has been off medication for 2-3 months.  She was controlled on Lisinopril and HCTZ in the past and states she had had some side effects with Amlodipine in the past and it did not seem effective for her when she tried it last year. - Lisinopril 20mg  Daily, plan to increased at follow up - HCTZ 12.5mg  Daily, plan to increase at follow up - BMP - Follow up in 3 weeks for recheck and repeat lab prior to dose change

## 2019-09-12 NOTE — Assessment & Plan Note (Addendum)
PHQ-9 19 today. She has been off of Prozac for a year or so. She states it did work for her in the past. She is not interested in counseling at this time, would like to give medication a try first. She understands it may take 4-6 weeks to take full effect. - Fluoxetine 40mg  Daily

## 2019-09-12 NOTE — Progress Notes (Signed)
Internal Medicine Clinic Attending  Case discussed with Dr. Melvin  at the time of the visit.  We reviewed the resident's history and exam and pertinent patient test results.  I agree with the assessment, diagnosis, and plan of care documented in the resident's note.  

## 2019-09-13 LAB — BMP8+ANION GAP
Anion Gap: 17 mmol/L (ref 10.0–18.0)
BUN/Creatinine Ratio: 6 — ABNORMAL LOW (ref 9–23)
BUN: 5 mg/dL — ABNORMAL LOW (ref 6–24)
CO2: 24 mmol/L (ref 20–29)
Calcium: 9.6 mg/dL (ref 8.7–10.2)
Chloride: 95 mmol/L — ABNORMAL LOW (ref 96–106)
Creatinine, Ser: 0.9 mg/dL (ref 0.57–1.00)
GFR calc Af Amer: 86 mL/min/{1.73_m2} (ref >59)
GFR calc non Af Amer: 74 mL/min/{1.73_m2} (ref >59)
Glucose: 116 mg/dL — ABNORMAL HIGH (ref 65–99)
Potassium: 4.9 mmol/L (ref 3.5–5.2)
Sodium: 136 mmol/L (ref 134–144)
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
## 2019-09-28 ENCOUNTER — Ambulatory Visit: Payer: Self-pay

## 2019-10-03 ENCOUNTER — Ambulatory Visit (INDEPENDENT_AMBULATORY_CARE_PROVIDER_SITE_OTHER): Payer: 59 | Admitting: Internal Medicine

## 2019-10-03 ENCOUNTER — Encounter: Payer: Self-pay | Admitting: Internal Medicine

## 2019-10-03 VITALS — BP 136/84 | HR 100 | Temp 98.4°F | Ht 65.0 in | Wt 229.7 lb

## 2019-10-03 DIAGNOSIS — Z79899 Other long term (current) drug therapy: Secondary | ICD-10-CM | POA: Diagnosis not present

## 2019-10-03 DIAGNOSIS — F329 Major depressive disorder, single episode, unspecified: Secondary | ICD-10-CM | POA: Diagnosis not present

## 2019-10-03 DIAGNOSIS — I10 Essential (primary) hypertension: Secondary | ICD-10-CM | POA: Diagnosis not present

## 2019-10-03 DIAGNOSIS — F320 Major depressive disorder, single episode, mild: Secondary | ICD-10-CM

## 2019-10-03 MED ORDER — HYDROCHLOROTHIAZIDE 12.5 MG PO TABS: 12.5000 mg | ORAL_TABLET | Freq: Every day | ORAL | 1 refills | Status: DC

## 2019-10-03 MED ORDER — LISINOPRIL 20 MG PO TABS: 20.0000 mg | ORAL_TABLET | Freq: Every day | ORAL | 1 refills | Status: DC

## 2019-10-03 MED ORDER — FLUOXETINE HCL 40 MG PO CAPS: 40.0000 mg | ORAL_CAPSULE | Freq: Every day | ORAL | 1 refills | Status: DC

## 2019-10-03 MED ORDER — FLUOXETINE HCL 20 MG PO TABS: 20.0000 mg | ORAL_TABLET | Freq: Every day | ORAL | 1 refills | Status: DC

## 2019-10-03 NOTE — Patient Instructions (Addendum)
Thank you for allowing [place] to care for you  For you High Blood Pressure - BP elevated today - Continue current medications - Labwork today  Dose of Prozac reduce in half today  Follow up in about 6-8 weeks with new PCP for further medication changes.

## 2019-10-03 NOTE — Assessment & Plan Note (Signed)
Patient states she did not tolerate the higher dose of her fluoxetine. Her dose will be reduced to 20mg  with plan to increase if needed/tolerated at PCP follow up. - Fluoxetine 20mg  Daily - Follow up with PCP in about 6 weeks

## 2019-10-03 NOTE — Progress Notes (Signed)
   CC: Hypertension, Depression  HPI:  [name] is a 54 y.o. F with PMHx listed below presenting for Hypertension, Depression. Please see the A&P for the status of the patient's chronic medical problems.  Past Medical History:  Diagnosis Date  . Breast pain 09/08/2017  . Depression   . GERD (gastroesophageal reflux disease)   . HTN (hypertension)   . Hyperlipemia   . Leiomyoma of uterus    s/p hysterectomy, hx of uterine hemorrhage  . Tobacco abuse    Review of Systems:  Performed and all others negative.  Physical Exam:  Vitals:   10/03/19 1317 10/03/19 1328  BP: 136/84   Pulse: (!) 118 100  Temp: 98.4 F (36.9 C)   TempSrc: Oral   SpO2: 95%   Weight: 229 lb 11.2 oz (104.2 kg)   Height: 5\' 5"  (1.651 m)    Physical Exam Constitutional:      General: She is not in acute distress.    Appearance: Normal appearance.  Cardiovascular:     Rate and Rhythm: Normal rate and regular rhythm.     Pulses: Normal pulses.     Heart sounds: Normal heart sounds.  Pulmonary:     Effort: Pulmonary effort is normal. No respiratory distress.     Breath sounds: Normal breath sounds.  Abdominal:     General: Bowel sounds are normal. There is no distension.     Palpations: Abdomen is soft.     Tenderness: There is no abdominal tenderness.  Musculoskeletal:        General: No swelling or deformity.  Skin:    General: Skin is warm and dry.  Neurological:     General: No focal deficit present.     Mental Status: Mental status is at baseline.      Assessment & Plan:   See Encounters Tab for problem based charting.  Patient discussed with Dr. Lynnae January

## 2019-10-03 NOTE — Assessment & Plan Note (Addendum)
BP today 136/84, this is improved from last visit. Will continue patient on current doses. She may be able to switch to single agent control if BP remains controlled at follow up. She has tolerated her medication well. Will recheck BMP. - Lisinopril 20mg  Daily - HCTZ 12.5mg  Daily - BMP - Follow up in about 6 weeks with PCP  ADDENDUM: BMP showed mild elevation in Cr from ~0.9 to 1.09. Not significantly elevated. Though may want to consider repeat at follow up with PCP to ensure it does not trend up further.

## 2019-10-03 NOTE — Progress Notes (Signed)
Internal Medicine Clinic Attending  Case discussed with Dr. Melvin  at the time of the visit.  We reviewed the resident's history and exam and pertinent patient test results.  I agree with the assessment, diagnosis, and plan of care documented in the resident's note.  

## 2019-10-04 LAB — BMP8+ANION GAP
Anion Gap: 18 mmol/L (ref 10.0–18.0)
BUN/Creatinine Ratio: 5 — ABNORMAL LOW (ref 9–23)
BUN: 5 mg/dL — ABNORMAL LOW (ref 6–24)
CO2: 23 mmol/L (ref 20–29)
Calcium: 9.8 mg/dL (ref 8.7–10.2)
Chloride: 94 mmol/L — ABNORMAL LOW (ref 96–106)
Creatinine, Ser: 1.09 mg/dL — ABNORMAL HIGH (ref 0.57–1.00)
GFR calc Af Amer: 68 mL/min/{1.73_m2} (ref >59)
GFR calc non Af Amer: 59 mL/min/{1.73_m2} — ABNORMAL LOW (ref >59)
Glucose: 149 mg/dL — ABNORMAL HIGH (ref 65–99)
Potassium: 4.4 mmol/L (ref 3.5–5.2)
Sodium: 135 mmol/L (ref 134–144)

## 2019-10-06 ENCOUNTER — Encounter: Payer: Self-pay | Admitting: Internal Medicine

## 2019-10-06 NOTE — Progress Notes (Signed)
Letter sent with BMP results. Cr mildly elevated, but not significantly so (not enough to be considered AKI). Electrolytes normal.

## 2019-10-07 ENCOUNTER — Ambulatory Visit: Payer: 59 | Attending: Internal Medicine

## 2019-10-07 DIAGNOSIS — Z20822 Contact with and (suspected) exposure to covid-19: Secondary | ICD-10-CM

## 2019-10-08 LAB — NOVEL CORONAVIRUS, NAA: SARS-CoV-2, NAA: NOT DETECTED

## 2019-10-08 LAB — SARS-COV-2, NAA 2 DAY TAT

## 2019-12-01 ENCOUNTER — Ambulatory Visit (INDEPENDENT_AMBULATORY_CARE_PROVIDER_SITE_OTHER): Payer: 59 | Admitting: Internal Medicine

## 2019-12-01 ENCOUNTER — Encounter: Payer: Self-pay | Admitting: Internal Medicine

## 2019-12-01 ENCOUNTER — Other Ambulatory Visit: Payer: Self-pay

## 2019-12-01 VITALS — BP 116/80 | HR 98 | Temp 99.1°F | Ht 65.0 in | Wt 227.3 lb

## 2019-12-01 DIAGNOSIS — E782 Mixed hyperlipidemia: Secondary | ICD-10-CM

## 2019-12-01 DIAGNOSIS — E785 Hyperlipidemia, unspecified: Secondary | ICD-10-CM | POA: Diagnosis not present

## 2019-12-01 DIAGNOSIS — Z0000 Encounter for general adult medical examination without abnormal findings: Secondary | ICD-10-CM | POA: Diagnosis not present

## 2019-12-01 DIAGNOSIS — I10 Essential (primary) hypertension: Secondary | ICD-10-CM | POA: Diagnosis not present

## 2019-12-01 NOTE — Patient Instructions (Signed)
Thank you, Ms.Georgina Quint Brunet for allowing [place] to provide your care today. Today we discussed Blood pressure, cholesterol.    I have ordered none labs for you. I will call if any are abnormal.     I have ordered the following medication/changed the following medications:  1. Rosuvastatin 10 mg  2. HCTZ-Lisinopril combination   Please follow-up in 3 months.    Should you have any questions or concerns please call the internal medicine clinic at [phone].    Marianna Payment, D.O. [facility]

## 2019-12-02 ENCOUNTER — Encounter: Payer: Self-pay | Admitting: Internal Medicine

## 2019-12-02 MED ORDER — ROSUVASTATIN CALCIUM 10 MG PO TABS: 10.0000 mg | ORAL_TABLET | Freq: Every day | ORAL | 11 refills | Status: DC

## 2019-12-02 MED ORDER — LISINOPRIL-HYDROCHLOROTHIAZIDE 20-12.5 MG PO TABS: 1.0000 | ORAL_TABLET | Freq: Every day | ORAL | 11 refills | Status: DC

## 2019-12-02 NOTE — Assessment & Plan Note (Signed)
Counseled patient on colonoscopy today.  She states that she would like to defer this until later time.

## 2019-12-02 NOTE — Assessment & Plan Note (Signed)
Patient's blood pressure today is 116/80 and well controlled on her current medication regimen.  I counseled her on continuing her hydrochlorothiazide 12.5 mg and lisinopril 20 mg daily.  I told her I would switch her to the combination pill today to make her medication regimen easier.  Plan: 1.  Start taking the hydrochlorothiazide-lisinopril combo medication at 12.5 mg - 20 mg respectively

## 2019-12-02 NOTE — Assessment & Plan Note (Signed)
Patient states that she is not taking atorvastatin at this time due to side effects.  She states that she gets myalgias when she takes it.  I counseled her on the importance of taking statin medication due to her significantly elevated cholesterol and elevated ASCVD risk greater than 7%.  I counseled her on changing the medication to a different statin to see if she tolerates it better.  I will start her off at a lower dose and titrate her up to moderate to high intensity statin as indicated.  Plan: -Start rosuvastatin 10 mg daily. -Discontinue atorvastatin 40 mg daily 2/2 to myalgias

## 2019-12-02 NOTE — Progress Notes (Signed)
Internal Medicine Clinic Attending  Case discussed with Dr. Coe at the time of the visit.  We reviewed the resident's history and exam and pertinent patient test results.  I agree with the assessment, diagnosis, and plan of care documented in the resident's note.  Joey Lierman, M.D., Ph.D.  

## 2019-12-02 NOTE — Progress Notes (Signed)
CC: Hypertension  HPI:  Ann Stone is a 54 y.o. female with a past medical history stated below and presents today for hypertension. Please see problem based assessment and plan for additional details.   Past Medical History:  Diagnosis Date  . Breast pain 09/08/2017  . Depression   . GERD (gastroesophageal reflux disease)   . HTN (hypertension)   . Hyperlipemia   . Leiomyoma of uterus    s/p hysterectomy, hx of uterine hemorrhage  . Tobacco abuse     Family History  Problem Relation Age of Onset  . Cancer Mother   . Osteoarthritis Mother   . Cancer Father      Social History   Socioeconomic History  . Marital status: Single    Spouse name: Not on file  . Number of children: Not on file  . Years of education: Not on file  . Highest education level: Not on file  Occupational History  . Not on file  Tobacco Use  . Smoking status: Former Smoker    Packs/day: 1.00    Types: Cigarettes    Quit date: 08/02/2018    Years since quitting: 1.3  . Smokeless tobacco: Never Used  Substance and Sexual Activity  . Alcohol use: Yes    Comment: occasional  . Drug use: No  . Sexual activity: Yes    Birth control/protection: Surgical  Other Topics Concern  . Not on file  Social History Narrative  . Not on file   Social Determinants of Health   Financial Resource Strain:   . Difficulty of Paying Living Expenses:   Food Insecurity:   . Worried About Charity fundraiser in the Last Year:   . Arboriculturist in the Last Year:   Transportation Needs:   . Film/video editor (Medical):   Marland Kitchen Lack of Transportation (Non-Medical):   Physical Activity:   . Days of Exercise per Week:   . Minutes of Exercise per Session:   Stress:   . Feeling of Stress :   Social Connections:   . Frequency of Communication with Friends and Family:   . Frequency of Social Gatherings with Friends and Family:   . Attends Religious Services:   . Active Member of Clubs or  Organizations:   . Attends Archivist Meetings:   Marland Kitchen Marital Status:   Intimate Partner Violence:   . Fear of Current or Ex-Partner:   . Emotionally Abused:   Marland Kitchen Physically Abused:   . Sexually Abused:      Review of Systems: ROS -negative except as noted on assessment and plan   Vitals:   12/01/19 1352  BP: 116/80  Pulse: 98  Temp: 99.1 F (37.3 C)  TempSrc: Oral  SpO2: 100%  Weight: 227 lb 4.8 oz (103.1 kg)  Height: 5\' 5"  (1.651 m)     Physical Exam: Physical Exam  Constitutional: She is oriented to person, place, and time and well-developed, well-nourished, and in no distress.  HENT:  Head: Normocephalic and atraumatic.  Eyes: EOM are normal.  Neck: No tracheal deviation present.  Cardiovascular: Normal rate and intact distal pulses. Exam reveals no gallop and no friction rub.  No murmur heard. Pulmonary/Chest: Effort normal and breath sounds normal. No respiratory distress.  Abdominal: Soft. Bowel sounds are normal. She exhibits no distension. There is no abdominal tenderness.  Musculoskeletal:        General: No tenderness or edema. Normal range of motion.  Neurological: She is  alert and oriented to person, place, and time.  Skin: Skin is warm and dry.     Assessment & Plan:   See Encounters Tab for problem based charting.  Patient discussed with Dr. Rebeca Alert

## 2020-10-03 ENCOUNTER — Telehealth: Payer: Self-pay

## 2020-10-03 NOTE — Telephone Encounter (Signed)
RX denied yesterday.  LOV was 12/01/19; Prozac was last sent to pharmacy on 10/03/19 for #30 with 1 refill.  TC to patient, she was informed she needed to be seen for additional refills.  Appointment scheduled for 10/08/20 @ 0845, red team, Dr. Gilford Rile. SChaplin, RN,BSN

## 2020-10-03 NOTE — Telephone Encounter (Signed)
Need refill on FLUoxetine (PROZAC) 20 MG tablet  ;pt contact [facility] ([place]), [place] - [address]

## 2020-10-08 ENCOUNTER — Encounter: Payer: Self-pay | Admitting: Internal Medicine

## 2020-10-08 ENCOUNTER — Other Ambulatory Visit: Payer: Self-pay

## 2020-10-08 ENCOUNTER — Ambulatory Visit: Payer: Self-pay | Admitting: Internal Medicine

## 2020-10-08 DIAGNOSIS — I10 Essential (primary) hypertension: Secondary | ICD-10-CM

## 2020-10-08 DIAGNOSIS — L723 Sebaceous cyst: Secondary | ICD-10-CM

## 2020-10-08 DIAGNOSIS — F320 Major depressive disorder, single episode, mild: Secondary | ICD-10-CM

## 2020-10-08 DIAGNOSIS — H938X2 Other specified disorders of left ear: Secondary | ICD-10-CM

## 2020-10-08 HISTORY — DX: Sebaceous cyst: L72.3

## 2020-10-08 MED ORDER — FLUOXETINE HCL 20 MG PO TABS: 20.0000 mg | ORAL_TABLET | Freq: Every day | ORAL | 3 refills | Status: DC

## 2020-10-08 MED ORDER — FLUOXETINE HCL 20 MG PO TABS: 20.0000 mg | ORAL_TABLET | Freq: Every day | ORAL | 0 refills | Status: DC

## 2020-10-08 NOTE — Patient Instructions (Addendum)
To Ms. Nester,  It was a pleasure meeting you today. Today we discussed your Prozac and ear bump. For your Prozac, I will refill it for a year's worth of the medication. For your ear bump, I would recommend using warm compresses with gentle massage of the ear. I will have you come back in a week's time to see if your bump is going down. If it hasn't and you are still having ear pain, we will explore other avenues to treat your bump. Have a good day!  Maudie Mercury, MD

## 2020-10-08 NOTE — Progress Notes (Signed)
Internal Medicine Clinic Attending  I saw and evaluated the patient.  I personally confirmed the key portions of the history and exam documented by Dr. Winters and I reviewed pertinent patient test results.  The assessment, diagnosis, and plan were formulated together and I agree with the documentation in the resident's note.  

## 2020-10-08 NOTE — Assessment & Plan Note (Signed)
Patient presents to the clinic for refills on her Prozac. She states that she takes Prozac 20 mg daily, but has been switching between 20 mg to 40 mg in the past. She has not experienced any side effects of her medication.   A/P:  Patient presents for refills on her Prozac. She states she has been taking this medication for approximately 4 years and has not experienced any side effects of her medication. Her last refill was for 30 days with one refill, so unsure if patient has been taking her medications daily. Will refill for a year.  - Refill Prozac 20 mg daily. 90 pills with 3 refills

## 2020-10-08 NOTE — Assessment & Plan Note (Signed)
Patient presents to the clinic for a left ear mass that has been present for 6 weeks.   Patient states that the mass initially was miniscule and nontender. She attributes this to wearing thick, cloth masks that were tight around the ears. Over the past six weeks the mass continued to grow and is now tender. She described the tenderness as "a pulling sensation." She wears bandanas, as her hair touching it aggravates the pain. She also avoids sleeping on the affected side as the pain will wake her up at night. She denies fevers, dental pain, headaches, n/v, or cuts of the affected ear. She was worried that the mass could possible be a keloid, but notes that keloids shouldn't be warm or this tender.   A/P:  Patient presents to the clinic with ear mass 6 weeks in duration. On physical examination, the mass is fluctuant and mildly tender to palpation, and has a head (see imaging under PE). These signs are concerning for a possible sebaceous cyst. Discussed that due to the area that the mass is found, and the worry for possible keloid development at the site if lysed that patient should likely be seen by ENT. Patient states that it may be "iffy" because she is unsure how much her out of pocket costs could be for a specialty visit. We discussed other options and agreed upon warm compresses at this time with a one week follow up. Patient agrees.  - Treat conservatively with warm compresses at this time.  - One week follow up.

## 2020-10-08 NOTE — Progress Notes (Signed)
   CC: Medication Refill and Ear Bump  HPI:  [name] is a 55 y.o. M/F, with a PMH noted below, who presents to the clinic for refills on her medications and an ear bump. To see the management of their acute and chronic conditions, please see the A&P note under the Encounters tab.   Past Medical History:  Diagnosis Date  . Breast pain 09/08/2017  . Depression   . GERD (gastroesophageal reflux disease)   . HTN (hypertension)   . Hyperlipemia   . Leiomyoma of uterus    s/p hysterectomy, hx of uterine hemorrhage  . Tobacco abuse    Review of Systems:   Review of Systems  Constitutional: Negative for chills, diaphoresis, malaise/fatigue and weight loss.  HENT: Positive for ear pain. Negative for congestion, ear discharge, hearing loss, nosebleeds, sinus pain, sore throat and tinnitus.   Eyes: Negative for blurred vision, double vision, photophobia and pain.  Respiratory: Negative for cough, hemoptysis and stridor.   Cardiovascular: Negative for chest pain.  Gastrointestinal: Negative for abdominal pain, constipation, diarrhea, nausea and vomiting.     Physical Exam:  Vitals:   10/08/20 0856 10/08/20 0922  BP: (!) 155/100 (!) 146/87  Pulse: (!) 108 (!) 108  Temp: 98.3 F (36.8 C)   TempSrc: Oral   SpO2: 99%   Weight: 248 lb 9.6 oz (112.8 kg)    Physical Exam Constitutional:      General: She is not in acute distress.    Appearance: Normal appearance. She is not ill-appearing or toxic-appearing.  HENT:     Head: Normocephalic and atraumatic.     Right Ear: External ear normal.     Ears:     Comments: L ear helix with tender, fluctuant mass, warm/tender to touch. No drainage present.     Nose: Nose normal.  Eyes:     General: No scleral icterus.       Right eye: No discharge.        Left eye: No discharge.  Cardiovascular:     Rate and Rhythm: Normal rate and regular rhythm.  Neurological:     Mental Status: She is alert and oriented to person, place, and  time.  Psychiatric:        Mood and Affect: Mood normal.        Behavior: Behavior normal.          Assessment & Plan:   See Encounters Tab for problem based charting.  Patient discussed with Dr. Heber Socorro

## 2020-10-08 NOTE — Assessment & Plan Note (Signed)
Patient presents to the office with vitals today of:  Today's Vitals   10/08/20 0856 10/08/20 0922  BP: (!) 155/100 (!) 146/87  Pulse: (!) 108 (!) 108  Temp: 98.3 F (36.8 C)   TempSrc: Oral   SpO2: 99%   Weight: 248 lb 9.6 oz (112.8 kg)    Body mass index is 41.37 kg/m.  Patient is on Zestoretic 20-12.5 mg daily. She has not taken her medication this morning.  Shestates that her blood pressure at home is usually around 110s/87. She does use a wrist cuff at home. We discussed that wrist cuffs typically give inaccurate readings, and that arm cuffs are more accurate.  - Patient will get a arm cuff and keep a BP journal to bring to next visit - Continue current regimen.

## 2020-10-15 ENCOUNTER — Other Ambulatory Visit: Payer: Self-pay

## 2020-10-15 ENCOUNTER — Ambulatory Visit: Payer: Self-pay | Admitting: Internal Medicine

## 2020-10-15 VITALS — BP 131/77 | HR 102 | Temp 98.2°F | Ht 65.0 in | Wt 244.8 lb

## 2020-10-15 DIAGNOSIS — R000 Tachycardia, unspecified: Secondary | ICD-10-CM

## 2020-10-15 DIAGNOSIS — L723 Sebaceous cyst: Secondary | ICD-10-CM

## 2020-10-15 DIAGNOSIS — I10 Essential (primary) hypertension: Secondary | ICD-10-CM

## 2020-10-15 MED ORDER — METOPROLOL SUCCINATE ER 25 MG PO TB24: 12.5000 mg | ORAL_TABLET | Freq: Every day | ORAL | 2 refills | Status: DC

## 2020-10-15 NOTE — Patient Instructions (Signed)
Thank you, [name] for allowing [place] to provide your care today. Today we discussed Epidermal inclusion cyst.    I have ordered the following labs for you:  Lab Orders  No laboratory test(s) ordered today     Tests ordered today:  None   Referrals ordered today:   Referral Orders  No referral(s) requested today     Medication Changes:   There are no discontinued medications.   No orders of the defined types were placed in this encounter.    Instructions: Please follow up in one week. We will call you to get set up with [facility] for the orange card. Please let us know if you develop worsening of the redness or swelling in your ear.   Follow up: 1 week    Should you have any questions or concerns please call the internal medicine clinic at [phone].     Marianna Payment, D.O. [place] [facility]

## 2020-10-15 NOTE — Progress Notes (Signed)
CC: Sebaceous cyst  HPI:  [name] is a 55 y.o. female with a past medical history stated below and presents today for cyst. Please see problem based assessment and plan for additional details.  Past Medical History:  Diagnosis Date  . Breast pain 09/08/2017  . Depression   . GERD (gastroesophageal reflux disease)   . HTN (hypertension)   . Hyperlipemia   . Leiomyoma of uterus    s/p hysterectomy, hx of uterine hemorrhage  . Tobacco abuse     Current Outpatient Medications on File Prior to Visit  Medication Sig Dispense Refill  . FLUoxetine (PROZAC) 20 MG tablet Take 1 tablet (20 mg total) by mouth daily. 90 tablet 3  . lisinopril-hydrochlorothiazide (ZESTORETIC) 20-12.5 MG tablet Take 1 tablet by mouth daily. 30 tablet 11  . rosuvastatin (CRESTOR) 10 MG tablet Take 1 tablet (10 mg total) by mouth daily. 30 tablet 11   No current facility-administered medications on file prior to visit.    Family History  Problem Relation Age of Onset  . Cancer Mother   . Osteoarthritis Mother   . Cancer Father     Social History   Socioeconomic History  . Marital status: Single    Spouse name: Not on file  . Number of children: Not on file  . Years of education: Not on file  . Highest education level: Not on file  Occupational History  . Not on file  Tobacco Use  . Smoking status: Former Smoker    Packs/day: 1.00    Types: Cigarettes    Quit date: 08/02/2018    Years since quitting: 2.2  . Smokeless tobacco: Never Used  Vaping Use  . Vaping Use: Never used  Substance and Sexual Activity  . Alcohol use: Yes    Comment: occasional  . Drug use: No  . Sexual activity: Yes    Birth control/protection: Surgical  Other Topics Concern  . Not on file  Social History Narrative  . Not on file   Social Determinants of Health   Financial Resource Strain: Not on file  Food Insecurity: Not on file  Transportation Needs: Not on file  Physical Activity: Not on file   Stress: Not on file  Social Connections: Not on file  Intimate Partner Violence: Not on file    Review of Systems: ROS negative except for what is noted on the assessment and plan.  Vitals:   10/15/20 1003  BP: 131/77  Pulse: (!) 102  Temp: 98.2 F (36.8 C)  TempSrc: Oral  SpO2: 98%  Weight: 244 lb 12.8 oz (111 kg)  Height: 5\' 5"  (1.651 m)     Physical Exam: Physical Exam Constitutional:      Appearance: Normal appearance.  HENT:     Head: Normocephalic and atraumatic.  Cardiovascular:     Rate and Rhythm: Regular rhythm. Tachycardia present.     Pulses: Normal pulses.     Heart sounds: No murmur heard. No friction rub. No gallop.   Pulmonary:     Effort: Pulmonary effort is normal.     Breath sounds: Normal breath sounds.  Abdominal:     General: There is no distension.     Palpations: Abdomen is soft.     Tenderness: There is no abdominal tenderness.  Musculoskeletal:        General: No swelling. Normal range of motion.     Cervical back: Normal range of motion.  Skin:    General: Skin is warm and  dry.     Findings: Lesion (left ear mass with erythema, centrel umbilication ) present.  Neurological:     Mental Status: She is alert.     Media Information         Document Information  Photos    10/15/2020 10:20  Attached To:  Office Visit on 10/15/20 with Marianna Payment, MD   Source Information  Marianna Payment, MD  [facility]      Assessment & Plan:   See Encounters Tab for problem based charting.  Patient discussed with Dr. Hilbert Odor, D.O. [facility], PGY-2 Pager: [phone], Phone: [phone] Date 10/16/2020 Time 5:17 AM

## 2020-10-16 ENCOUNTER — Encounter: Payer: Self-pay | Admitting: Internal Medicine

## 2020-10-16 DIAGNOSIS — R000 Tachycardia, unspecified: Secondary | ICD-10-CM | POA: Insufficient documentation

## 2020-10-16 NOTE — Assessment & Plan Note (Signed)
Patient presented for reevaluation of her left her sebaceous cyst. The cyst is still enlarged and apparently inflamed despite conservative management with warm compresses. I counseled her on her treatment options including surgical removal, steroid injection, and compresses. She states that she would like a steroid injection today.  The left ear cyst was cleaned with ETOH swab and 0.250 cc of 3 mg/mL Kenalog was injected unto the cyst. The patient tolerated the procedure well with minimal blood loss.   Plan: - Steroid injection of left ear cyst.  - Reevaluate in 1 week

## 2020-10-16 NOTE — Assessment & Plan Note (Signed)
Patient presents with a hisotry of sinus tachycardia. She denies a history of afib/flutter. Her EKG back in 2018 showed normal sinus rhythm. She was previously taking metoprolol has she was experiencing some palpations in the past. She states that she was taken off of this medication many years ago due to a side effect of nonspecific "numbness" but she does not believe it was related to the medication dn has since resolved. She denies any significant symptoms today. Considering her lack of insurance, I will hold off on any further diagnostic tests. I counseled her on restarting metoprolol for her tachycardia and blood pressure. We will try a low does and titrate up as needed. I informed her to call if she has any side effects. She agreed with this plan.    Plan:  - Metoprolol succinate 12.5 mg daily

## 2020-10-16 NOTE — Assessment & Plan Note (Signed)
Added metoprolol succinate 12.5 gm daily. Will reassess her BP at her follow up visit in 1 weeks.

## 2020-10-19 NOTE — Progress Notes (Signed)
Internal Medicine Clinic Attending  I saw and evaluated the patient.  I personally confirmed the key portions of the history and exam documented by Dr. Marianna Payment and I reviewed pertinent patient test results.  The assessment, diagnosis, and plan were formulated together and I agree with the documentation in the resident's note. No signs of infection associated with the ear cyst. Definitive treatment would be complete excision, steroid injection is temporizing to calm inflammation and pain.

## 2020-10-22 ENCOUNTER — Ambulatory Visit: Payer: Self-pay | Admitting: Internal Medicine

## 2020-10-22 ENCOUNTER — Other Ambulatory Visit: Payer: Self-pay

## 2020-10-22 ENCOUNTER — Encounter: Payer: Self-pay | Admitting: Internal Medicine

## 2020-10-22 VITALS — BP 152/93 | HR 100 | Ht 65.0 in | Wt 246.8 lb

## 2020-10-22 DIAGNOSIS — Z0000 Encounter for general adult medical examination without abnormal findings: Secondary | ICD-10-CM

## 2020-10-22 DIAGNOSIS — I10 Essential (primary) hypertension: Secondary | ICD-10-CM

## 2020-10-22 DIAGNOSIS — R000 Tachycardia, unspecified: Secondary | ICD-10-CM

## 2020-10-22 DIAGNOSIS — L723 Sebaceous cyst: Secondary | ICD-10-CM

## 2020-10-22 MED ORDER — DILTIAZEM HCL ER COATED BEADS 120 MG PO CP24: 120.0000 mg | ORAL_CAPSULE | Freq: Every day | ORAL | 2 refills | Status: DC

## 2020-10-22 NOTE — Assessment & Plan Note (Signed)
Patient presents for reevaluation of her sebaceous cyst.  Patient had her cyst injected at at her previous office appointment 1 week ago.  She states that she has significant improvement of the pain and swelling.  On evaluation the cyst remains enlarged but less tender.  She will likely need surgical evaluation of the cyst for permanent management.  This is pending meeting with [facility] for orange card.  Plan: -We will need referral to dermatology for surgical removal of her sebaceous cyst on her left ear -Continue warm compresses.

## 2020-10-22 NOTE — Assessment & Plan Note (Signed)
Patient will need Covid shots, HCV screening, mammogram, and colonoscopy/Cologuard when she gets the orange card.

## 2020-10-22 NOTE — Assessment & Plan Note (Signed)
Patient presents for reevaluation of her tachycardia.  Dates that she was unable to tolerate the metoprolol and developed paresthesias of her left upper extremity within 2 days of taking the medication.  She discontinued medication with resolution of her symptoms.  Her heart rate remains elevated in the low 100s today.  She will likely need further evaluation management including Holter monitor and possible cardiology referral in the near future.  This is pending meeting with [facility] for orange card.  Plan: -Discontinue metoprolol -Start Cardizem 120 mg extended release daily

## 2020-10-22 NOTE — Assessment & Plan Note (Signed)
Patient presents for reevaluation of blood pressure.  Her blood pressure was 152/93 on lisinopril-HCTZ 20-12.5 mg daily.  Patient states that she stopped taking her metoprolol after 2 days due to reaction.  Otherwise she denies any side effects from her blood pressure medications.  Due to her elevated heart rate as well as her BP, I will start her on diltiazem 120 mg extended release today.  Plan: -Continue lisinopril-HCTZ -Start diltiazem 120 mg daily

## 2020-10-22 NOTE — Patient Instructions (Signed)
Thank you, [name] for allowing [place] to provide your care today. Today we discussed sebaceous cyst on ear, blood pressure and heart rate.    I have ordered the following labs for you:  Lab Orders  No laboratory test(s) ordered today     Tests ordered today:  None  Referrals ordered today:   Referral Orders  No referral(s) requested today     Medication Changes:   There are no discontinued medications.   Meds ordered this encounter  Medications  . diltiazem (CARDIZEM CD) 120 MG 24 hr capsule    Sig: Take 1 capsule (120 mg total) by mouth daily.    Dispense:  30 capsule    Refill:  2     Instructions: Please call me if you are not able to get the Cardizem listed above.    Follow up: 1 month   Remember: Meet with Ann Stone.   Should you have any questions or concerns please call the internal medicine clinic at [phone].     Marianna Payment, D.O. [place]

## 2020-10-22 NOTE — Progress Notes (Signed)
CC: Sebaceous cyst  HPI:  [name] is a 55 y.o. female with a past medical history stated below and presents today for cyst. Please see problem based assessment and plan for additional details.  Past Medical History:  Diagnosis Date  . Breast pain 09/08/2017  . Depression   . GERD (gastroesophageal reflux disease)   . HTN (hypertension)   . Hyperlipemia   . Leiomyoma of uterus    s/p hysterectomy, hx of uterine hemorrhage  . Tobacco abuse     Current Outpatient Medications on File Prior to Visit  Medication Sig Dispense Refill  . FLUoxetine (PROZAC) 20 MG tablet Take 1 tablet (20 mg total) by mouth daily. 90 tablet 3  . lisinopril-hydrochlorothiazide (ZESTORETIC) 20-12.5 MG tablet Take 1 tablet by mouth daily. 30 tablet 11  . rosuvastatin (CRESTOR) 10 MG tablet Take 1 tablet (10 mg total) by mouth daily. 30 tablet 11   No current facility-administered medications on file prior to visit.    Family History  Problem Relation Age of Onset  . Cancer Mother   . Osteoarthritis Mother   . Cancer Father     Social History   Socioeconomic History  . Marital status: Single    Spouse name: Not on file  . Number of children: Not on file  . Years of education: Not on file  . Highest education level: Not on file  Occupational History  . Not on file  Tobacco Use  . Smoking status: Former Smoker    Packs/day: 1.00    Types: Cigarettes    Quit date: 08/02/2018    Years since quitting: 2.2  . Smokeless tobacco: Never Used  Vaping Use  . Vaping Use: Never used  Substance and Sexual Activity  . Alcohol use: Yes    Comment: occasional  . Drug use: No  . Sexual activity: Yes    Birth control/protection: Surgical  Other Topics Concern  . Not on file  Social History Narrative  . Not on file   Social Determinants of Health   Financial Resource Strain: Not on file  Food Insecurity: Not on file  Transportation Needs: Not on file  Physical Activity: Not on file   Stress: Not on file  Social Connections: Not on file  Intimate Partner Violence: Not on file    Review of Systems: ROS negative except for what is noted on the assessment and plan.  Vitals:   10/22/20 0847 10/22/20 0852  BP: (!) 158/96 (!) 152/93  Pulse: (!) 105 100  SpO2: 96%   Weight: 246 lb 12.8 oz (111.9 kg)   Height: 5\' 5"  (1.651 m)      Physical Exam: Physical Exam Constitutional:      Appearance: Normal appearance.  HENT:     Head: Normocephalic and atraumatic.  Eyes:     Extraocular Movements: Extraocular movements intact.  Cardiovascular:     Rate and Rhythm: Tachycardia present.     Pulses: Normal pulses.     Heart sounds: Murmur (possible 1/6 systolic murmur that is difficult to appreciate. ) heard.    Pulmonary:     Effort: Pulmonary effort is normal.     Breath sounds: Normal breath sounds.  Abdominal:     General: Bowel sounds are normal.     Palpations: Abdomen is soft.     Tenderness: There is no abdominal tenderness.  Musculoskeletal:        General: Normal range of motion.     Cervical back: Normal range  of motion.     Right lower leg: No edema.     Left lower leg: No edema.  Skin:    General: Skin is warm and dry.     Findings: Lesion (Sebaceous cyst of the left ear improve from prior evaluation.) present.  Neurological:     Mental Status: She is alert and oriented to person, place, and time. Mental status is at baseline.  Psychiatric:        Mood and Affect: Mood normal.       Media Information         Document Information  Photos    10/22/2020 08:57  Attached To:  Office Visit on 10/22/20 with Marianna Payment, MD    Assessment & Plan:   See Encounters Tab for problem based charting.  Patient discussed with Dr. Lars Mage, D.O. [facility], PGY-2 Pager: [phone], Phone: [phone] Date 10/22/2020 Time 9:23 AM

## 2020-10-25 NOTE — Progress Notes (Signed)
Internal Medicine Clinic Attending  Case discussed with Dr. Coe  At the time of the visit.  We reviewed the resident's history and exam and pertinent patient test results.  I agree with the assessment, diagnosis, and plan of care documented in the resident's note.  

## 2020-11-20 ENCOUNTER — Ambulatory Visit: Payer: Self-pay | Admitting: Internal Medicine

## 2020-11-20 ENCOUNTER — Ambulatory Visit: Payer: Self-pay

## 2020-11-20 ENCOUNTER — Encounter: Payer: Self-pay | Admitting: Internal Medicine

## 2020-11-20 VITALS — BP 126/81 | HR 96 | Temp 98.0°F | Wt 244.2 lb

## 2020-11-20 DIAGNOSIS — Z1231 Encounter for screening mammogram for malignant neoplasm of breast: Secondary | ICD-10-CM

## 2020-11-20 DIAGNOSIS — F320 Major depressive disorder, single episode, mild: Secondary | ICD-10-CM

## 2020-11-20 DIAGNOSIS — L723 Sebaceous cyst: Secondary | ICD-10-CM

## 2020-11-20 DIAGNOSIS — R000 Tachycardia, unspecified: Secondary | ICD-10-CM

## 2020-11-20 DIAGNOSIS — I10 Essential (primary) hypertension: Secondary | ICD-10-CM

## 2020-11-20 DIAGNOSIS — E782 Mixed hyperlipidemia: Secondary | ICD-10-CM

## 2020-11-20 MED ORDER — FLUOXETINE HCL 20 MG PO TABS: 40.0000 mg | ORAL_TABLET | Freq: Every day | ORAL | 5 refills | Status: DC

## 2020-11-20 NOTE — Assessment & Plan Note (Signed)
Continues to have cyst in place, no changes from last visit. No pain noted at this time. She is still using warm compresses on the area. She will need dermatology referral once orange card is obtained.

## 2020-11-20 NOTE — Assessment & Plan Note (Signed)
On lisinopril-HCTZ 20-25 mg daily and diltiazem 120 mg daily.  Blood pressure today is 126/81, at goal.  Denies any side effects to the medications.  Last BMP 10/03/19- Cr 1.09 and K 4.4.   -Continue current medications -Repeat BMP at next visit

## 2020-11-20 NOTE — Assessment & Plan Note (Addendum)
Patient is on rosuvastatin 10 mg daily, she reported not taking this medication due to it causing nausea. She took it for about 1 month and about 30 minutes after taking it she started having nausea.  Lipid panel on 12/19 showed cholesterol 317, HDL 31, and LDL 235.  We discussed importance of taking a statin medication, discussed that nausea is not a common side effect of atorvastatin and discussed possibility of restarting the atorvastatin. She was agreeable to trying the medication again, advised that if her symptoms return to contact us and we can try a different statin medication or a different cholesterol medication.   -Restart Rosuvastatin 10 mg daily -Repeat lipid panel today  Addendum: Repeat lipid panel showed cholesterol 328, LDL 224, HDL 41. Contacted patient regarding results. Informed of elevated LDL and need to increase statin dose to a high intensity statin. Will repeat lipid panel in 1-3 months.  -Increase rosuvastatin to 40 mg daily -Repeat lipid panel in 1-3 months

## 2020-11-20 NOTE — Assessment & Plan Note (Signed)
On Prozac 40 mg daily, she reports that this is doing well for her.  She had been prescribed 40 mg daily in the past however went down to 20 mg daily because she had not been taking the 40 mg for a while, however then increased to 40 by herself. Reports doing well with current dose.   -Updated prozac 40 mg daily

## 2020-11-20 NOTE — Patient Instructions (Signed)
Ann Stone,  It was a pleasure to see you today. Thank you for coming in.   Today we discussed your blood pressure and heart rate. This looks good today, I am glad that the diltiazem is working well for you.  Please continue your current medications.  We also discussed your cholesterol.  We are repeating some labs today. Please try taking the atorvastatin again, please let us know if you have difficulty taking this.   I have placed an order for the mammogram, please use the contact information provided to schedule this.  In regards to the cyst, please continue using the warm compresses.  When you get the orange card we can try referring you to dermatology.  Please return to clinic in 3 months or sooner if needed.   Thank you again for coming in.   Ann Stone.D.

## 2020-11-20 NOTE — Progress Notes (Signed)
   CC: HTN, tachycardia, depression  HPI:  [name] is a 55 y.o. with a history of hypertension, depression, hyperlipidemia, tobacco use, and sebaceous cyst is presenting for hypertension and tachycardia.  Past Medical History:  Diagnosis Date  . Breast pain 09/08/2017  . Depression   . GERD (gastroesophageal reflux disease)   . HTN (hypertension)   . Hyperlipemia   . Leiomyoma of uterus    s/p hysterectomy, hx of uterine hemorrhage  . Tobacco abuse    Review of Systems:   Constitutional: Negative for chills and fever.  Respiratory: Negative for shortness of breath.   Cardiovascular: Negative for chest pain and leg swelling.  Gastrointestinal: Negative for abdominal pain, nausea and vomiting.  Neurological: Negative for dizziness and headaches.    Physical Exam:  Vitals:   11/20/20 1418  BP: 126/81  Pulse: 96  Temp: 98 F (36.7 C)  TempSrc: Oral  SpO2: 98%  Weight: 244 lb 3.2 oz (110.8 kg)   Physical Exam HENT:     Head: Normocephalic and atraumatic.  Eyes:     Conjunctiva/sclera: Conjunctivae normal.     Pupils: Pupils are equal, round, and reactive to light.  Neck:     Thyroid: No thyromegaly.  Cardiovascular:     Rate and Rhythm: Normal rate and regular rhythm.     Heart sounds: Normal heart sounds. No murmur heard. No friction rub. No gallop.   Pulmonary:     Effort: Pulmonary effort is normal. No respiratory distress.     Breath sounds: Normal breath sounds. No wheezing.  Abdominal:     General: Bowel sounds are normal. There is no distension.     Palpations: Abdomen is soft.  Musculoskeletal:        General: Normal range of motion.     Cervical back: Normal range of motion and neck supple.  Skin:    General: Skin is warm and dry.     Findings: No erythema.  Neurological:     Mental Status: She is alert and oriented to person, place, and time.     Gait: Gait is intact.  Psychiatric:        Mood and Affect: Mood and affect normal.      Assessment & Plan:   See Encounters Tab for problem based charting.  Patient discussed with Dr. Philipp Ovens

## 2020-11-20 NOTE — Assessment & Plan Note (Signed)
Patient is currently on Cardizem 120 ER daily. Denies any chest pain, headaches, lightheadness, dizziness, nausea, vomiting, or other symptoms. Patient has a history of sinus tachycardia, will need follow up with cardiology for further evaluation of her tachycardia once she is able to obtain insurance.

## 2020-11-21 LAB — LIPID PANEL
Chol/HDL Ratio: 8 ratio — ABNORMAL HIGH (ref 0.0–4.4)
Cholesterol, Total: 328 mg/dL — ABNORMAL HIGH (ref 100–199)
HDL: 41 mg/dL (ref >39)
LDL Chol Calc (NIH): 224 mg/dL — ABNORMAL HIGH (ref 0–99)
Triglycerides: 303 mg/dL — ABNORMAL HIGH (ref 0–149)
VLDL Cholesterol Cal: 63 mg/dL — ABNORMAL HIGH (ref 5–40)

## 2020-11-23 NOTE — Progress Notes (Signed)
Internal Medicine Clinic Attending ° °Case discussed with Dr. Krienke  At the time of the visit.  We reviewed the resident’s history and exam and pertinent patient test results.  I agree with the assessment, diagnosis, and plan of care documented in the resident’s note.  °

## 2020-11-27 MED ORDER — ROSUVASTATIN CALCIUM 40 MG PO TABS: 40.0000 mg | ORAL_TABLET | Freq: Every day | ORAL | 5 refills | Status: DC

## 2020-11-27 NOTE — Addendum Note (Signed)
Addended by: Asencion Noble on: 11/27/2020 10:11 AM   Modules accepted: Orders

## 2021-01-01 ENCOUNTER — Other Ambulatory Visit: Payer: Self-pay | Admitting: Internal Medicine

## 2021-01-01 DIAGNOSIS — I10 Essential (primary) hypertension: Secondary | ICD-10-CM

## 2021-01-02 ENCOUNTER — Other Ambulatory Visit: Payer: Self-pay | Admitting: Internal Medicine

## 2021-01-02 DIAGNOSIS — I10 Essential (primary) hypertension: Secondary | ICD-10-CM

## 2021-01-02 NOTE — Telephone Encounter (Signed)
This Rx for lisinopril-HCTZ was denied as it had already been sent to Pharmacy. Confirmed with Missy at [facility] that this med is ready for p/u. Missy will notify patient. Nothing further needed.

## 2021-01-15 ENCOUNTER — Encounter: Payer: Self-pay | Admitting: *Deleted

## 2021-02-15 ENCOUNTER — Other Ambulatory Visit: Payer: Self-pay | Admitting: Student

## 2021-02-15 DIAGNOSIS — I10 Essential (primary) hypertension: Secondary | ICD-10-CM

## 2021-03-27 ENCOUNTER — Ambulatory Visit (INDEPENDENT_AMBULATORY_CARE_PROVIDER_SITE_OTHER): Payer: 59 | Admitting: Internal Medicine

## 2021-03-27 ENCOUNTER — Other Ambulatory Visit: Payer: Self-pay

## 2021-03-27 ENCOUNTER — Encounter: Payer: Self-pay | Admitting: Internal Medicine

## 2021-03-27 VITALS — BP 113/91 | HR 106 | Temp 98.7°F | Resp 24 | Ht 65.0 in | Wt 244.7 lb

## 2021-03-27 DIAGNOSIS — L723 Sebaceous cyst: Secondary | ICD-10-CM | POA: Diagnosis not present

## 2021-03-27 DIAGNOSIS — R000 Tachycardia, unspecified: Secondary | ICD-10-CM

## 2021-03-27 DIAGNOSIS — H6122 Impacted cerumen, left ear: Secondary | ICD-10-CM

## 2021-03-27 DIAGNOSIS — H9202 Otalgia, left ear: Secondary | ICD-10-CM | POA: Insufficient documentation

## 2021-03-27 DIAGNOSIS — I10 Essential (primary) hypertension: Secondary | ICD-10-CM

## 2021-03-27 MED ORDER — DILTIAZEM HCL ER COATED BEADS 120 MG PO CP24: 120.0000 mg | ORAL_CAPSULE | Freq: Every day | ORAL | 3 refills | Status: DC

## 2021-03-27 NOTE — Patient Instructions (Signed)
To Ms. Doose,   It was a pleasure meeting you today! Today we discussed your ear pain, earlobe mass, and fast heart rate (otherwise called tachycardia).   For your ear pain, there is no retained cotton from your cotton swab, but you do have earwax that we will clear out today.   For your ear mass, we are putting in a referral to dermatology to assist you with it.   For your heart rate, I have refilled your Cardizem, and will also have cardiology see you for further evaluation.   Otherwise, we will see you back in 3 months! Maudie Mercury, MD

## 2021-03-27 NOTE — Assessment & Plan Note (Signed)
Patient presents today with acute complaint of left-sided ear pain. She states that he has had this pain now for several weeks. She denies any fevers, nausea, vomiting, during this time.  She states that she is concerned that this may be possibly due to a retained piece of cotton from an ear swab will cleaning her ears.  Assessment: On physical examination right ear canal is clear, tympanic membrane is intact with no signs of purulence, drainage, or bulging.  On examination of the left ear canal it is significant for cerumen impaction, covering approximately 50% of the ear canal.  After irrigation, retained piece of cotton swab was found. - Successful cerumen irrigation significant for foreign body, and resolution of symptoms.

## 2021-03-27 NOTE — Assessment & Plan Note (Signed)
Patient with tachycardia currently on Cardizem 120 mg daily.  She has been out of her medication today, she notices a little bit of "heart racing" concerning of her medication 2 days ago.  But her palpitations are much more improved with this medication.  Given her persistent symptoms, will refer to cardiology for possible Zio patch and further evaluation. - Refill Cardizem 20 mg daily - Refer to cardiology.

## 2021-03-27 NOTE — Assessment & Plan Note (Signed)
Patient presents for follow-up on her sebaceous cyst that on her left ear.  She states that it has grown since last time she saw [place], and is tender to the touch.  On physical examination there is an approximate 2.5 cm mass at the top of the left earlobe, that is tender to the touch. - Referral to dermatology

## 2021-03-27 NOTE — Progress Notes (Signed)
   CC: Ear pain, ear mass  HPI:  [name] is a 55 y.o. person, with a PMH noted below, who presents to the clinic ear pain, ear mass. To see the management of their acute and chronic conditions, please see the A&P note under the Encounters tab.   Past Medical History:  Diagnosis Date   Breast pain 09/08/2017   Depression    GERD (gastroesophageal reflux disease)    HTN (hypertension)    Hyperlipemia    Leiomyoma of uterus    s/p hysterectomy, hx of uterine hemorrhage   Tobacco abuse    Review of Systems:   Review of Systems  Constitutional:  Negative for chills, fever and malaise/fatigue.  HENT:  Positive for ear pain. Negative for congestion, ear discharge, hearing loss, nosebleeds, sinus pain and tinnitus.        L ear pain  Eyes:  Negative for blurred vision and pain.  Cardiovascular:  Negative for palpitations.  Gastrointestinal:  Negative for abdominal pain, nausea and vomiting.    Physical Exam:  Vitals:   03/27/21 1404 03/27/21 1412  BP: (!) 131/57 (!) 113/91  Pulse: (!) 108 (!) 106  Resp: (!) 24   Temp: 98.7 F (37.1 C)   TempSrc: Oral   SpO2: 98%   Weight: 244 lb 11.2 oz (111 kg)   Height: '5\' 5"'$  (1.651 m)    Physical Exam Constitutional:      General: She is not in acute distress.    Appearance: She is obese. She is not ill-appearing, toxic-appearing or diaphoretic.  HENT:     Right Ear: Tympanic membrane, ear canal and external ear normal. There is no impacted cerumen.     Left Ear: Tympanic membrane normal. There is impacted cerumen.     Ears:     Comments: 2.5 cm mass located at the superior pole of the L ear Impacted, dry cerumen in the ear canal blocking 50% of the canal Eyes:     General: No scleral icterus.       Right eye: No discharge.        Left eye: No discharge.  Cardiovascular:     Rate and Rhythm: Tachycardia present.  Pulmonary:     Effort: Pulmonary effort is normal.     Breath sounds: Normal breath sounds.  Neurological:      General: No focal deficit present.     Mental Status: She is alert and oriented to person, place, and time.  Psychiatric:        Mood and Affect: Mood normal.        Behavior: Behavior normal.     Assessment & Plan:   See Encounters Tab for problem based charting.  Patient discussed with Dr. Heber Re[place]

## 2021-03-28 NOTE — Progress Notes (Signed)
Internal Medicine Clinic Attending ? ?Case discussed with Dr. Winters  At the time of the visit.  We reviewed the resident?s history and exam and pertinent patient test results.  I agree with the assessment, diagnosis, and plan of care documented in the resident?s note.  ?

## 2021-04-03 ENCOUNTER — Other Ambulatory Visit: Payer: Self-pay | Admitting: Internal Medicine

## 2021-04-03 DIAGNOSIS — I10 Essential (primary) hypertension: Secondary | ICD-10-CM

## 2021-04-05 NOTE — Telephone Encounter (Signed)
lisinopril-hydrochlorothiazide (ZESTORETIC) 20-12.5 MG tablet, REFILL REQUEST @ [place] ([place]), [place] - [address]

## 2021-04-07 ENCOUNTER — Other Ambulatory Visit: Payer: Self-pay

## 2021-04-07 DIAGNOSIS — I10 Essential (primary) hypertension: Secondary | ICD-10-CM

## 2021-04-08 NOTE — Telephone Encounter (Signed)
Pt states she has now been 8 days without her BP medication.  Patient states she called 04/03/2021 and has not heard from anyone.  Pt has also called her pharmacy on yesterday to f/u and that have also sent a request.  Patient requesting a call back about the delay in getting her medication refilled.

## 2021-04-08 NOTE — Telephone Encounter (Signed)
Refill approved.

## 2021-04-10 MED ORDER — LISINOPRIL-HYDROCHLOROTHIAZIDE 20-12.5 MG PO TABS: 1.0000 | ORAL_TABLET | Freq: Every day | ORAL | 0 refills | Status: DC

## 2021-04-25 ENCOUNTER — Ambulatory Visit (INDEPENDENT_AMBULATORY_CARE_PROVIDER_SITE_OTHER): Payer: 59 | Admitting: Student

## 2021-04-25 ENCOUNTER — Other Ambulatory Visit (HOSPITAL_COMMUNITY)
Admission: RE | Admit: 2021-04-25 | Discharge: 2021-04-25 | Disposition: A | Discharge status: Home / Self Care | Payer: 59 | Source: Ambulatory Visit | Attending: Internal Medicine | Admitting: Internal Medicine

## 2021-04-25 ENCOUNTER — Other Ambulatory Visit: Payer: Self-pay

## 2021-04-25 ENCOUNTER — Encounter: Payer: Self-pay | Admitting: Internal Medicine

## 2021-04-25 VITALS — BP 133/69 | HR 92 | Temp 98.3°F | Ht 65.0 in | Wt 243.0 lb

## 2021-04-25 DIAGNOSIS — N898 Other specified noninflammatory disorders of vagina: Secondary | ICD-10-CM | POA: Insufficient documentation

## 2021-04-25 DIAGNOSIS — I10 Essential (primary) hypertension: Secondary | ICD-10-CM | POA: Diagnosis not present

## 2021-04-25 DIAGNOSIS — E782 Mixed hyperlipidemia: Secondary | ICD-10-CM | POA: Diagnosis not present

## 2021-04-25 NOTE — Assessment & Plan Note (Signed)
Assessment: Current regimen of lisinopril-HCTZ 20-25 mg daily.  Recently prescribed diltiazem but she stopped taking it as she said it made her feel poorly.  She does not take her blood pressures at home, in the clinic repeat blood pressure was 133/69.  Encourage patient to purchase home blood pressure cuff and check weekly, record these and bring them in to her next clinic visit.  Plan: -Continue lisinopril-HCTZ 20 to 25 mg daily. -Follow-up with cardiology in November for tachycardia -Repeat BMP when patient returns in 4 weeks to check her cholesterol levels

## 2021-04-25 NOTE — Patient Instructions (Signed)
Thank you, Ms.Georgina Quint Renne for allowing [place] to provide your care today. Today we discussed  Vaginal discharge I will call you with the results of your vaginal swab testing.  In the meantime please wear pads to prevent discharge on her underwear and/or pants.  Use over-the-counter itch cream and take Tylenol for any pain.  I will also be referring you to Gynecology for the right-sided mass that was seen on your vaginal exam.  High blood pressure Please continue to take her blood pressure medications.  I recommend that you buy an over-the-counter blood pressure cuff that fits around your arm.  Use this once a week and keep track of her blood pressures.  Please take your blood pressure when you are seated in a relaxed position with both feet on the ground.  Keep track of these and please bring them to your next appointment.  If you ever have an episode lightheaded or dizziness please call the clinic  High cholesterol Please continue to take your cholesterol medicine, we will recheck your cholesterol level in the next 4 weeks.  Please schedule this at the front desk prior to leaving.  I have ordered the following labs for you:  Lab Orders         HIV antibody (with reflex)       Tests ordered today:    Referrals ordered today:   Referral Orders         Ambulatory referral to Obstetrics / Gynecology       I have ordered the following medication/changed the following medications:   Stop the following medications: There are no discontinued medications.   Start the following medications: No orders of the defined types were placed in this encounter.    Follow up:  4 Weeks     Remember: Please call our clinic if your symptoms begin to worsen or new symptoms arise  Should you have any questions or concerns please call the internal medicine clinic at [phone].     Sanjuana Letters, D.O. [place]

## 2021-04-25 NOTE — Progress Notes (Signed)
CC: Vaginal discharge and itching  HPI:  [name] is a 55 y.o. female with a past medical history stated below and presents today for vaginal itching and clear discharge for the past 3 days.  Please see problem based assessment and plan for additional details.  Past Medical History:  Diagnosis Date   Breast pain 09/08/2017   Depression    GERD (gastroesophageal reflux disease)    HTN (hypertension)    Hyperlipemia    Leiomyoma of uterus    s/p hysterectomy, hx of uterine hemorrhage   Tobacco abuse     Current Outpatient Medications on File Prior to Visit  Medication Sig Dispense Refill   diltiazem (CARDIZEM CD) 120 MG 24 hr capsule Take 1 capsule (120 mg total) by mouth daily. 90 capsule 3   FLUoxetine (PROZAC) 20 MG tablet Take 2 tablets (40 mg total) by mouth daily. 60 tablet 5   lisinopril-hydrochlorothiazide (ZESTORETIC) 20-12.5 MG tablet Take 1 tablet by mouth once daily 30 tablet 2   lisinopril-hydrochlorothiazide (ZESTORETIC) 20-12.5 MG tablet Take 1 tablet by mouth daily. 30 tablet 0   rosuvastatin (CRESTOR) 40 MG tablet Take 1 tablet (40 mg total) by mouth daily. 30 tablet 5   No current facility-administered medications on file prior to visit.    Family History  Problem Relation Age of Onset   Cancer Mother    Osteoarthritis Mother    Cancer Father     Social History   Socioeconomic History   Marital status: Single    Spouse name: Not on file   Number of children: Not on file   Years of education: Not on file   Highest education level: Not on file  Occupational History   Not on file  Tobacco Use   Smoking status: Former    Packs/day: 1.00    Types: Cigarettes    Quit date: 08/02/2018    Years since quitting: 2.7   Smokeless tobacco: Never  Vaping Use   Vaping Use: Never used  Substance and Sexual Activity   Alcohol use: Yes    Comment: occasional   Drug use: No   Sexual activity: Yes    Birth control/protection: Surgical  Other Topics  Concern   Not on file  Social History Narrative   Not on file   Social Determinants of Health   Financial Resource Strain: Not on file  Food Insecurity: Not on file  Transportation Needs: Not on file  Physical Activity: Not on file  Stress: Not on file  Social Connections: Not on file  Intimate Partner Violence: Not on file    Review of Systems: ROS negative except for what is noted on the assessment and plan.  Vitals:   04/25/21 0843  BP: (!) 133/120  Pulse: (!) 101  Temp: 98.3 F (36.8 C)  TempSrc: Oral  SpO2: 95%  Weight: 243 lb (110.2 kg)  Height: 5\' 5"  (1.651 m)   Physical Exam: Constitutional: Well-appearing, no acute distress HENT: normocephalic atraumatic Eyes: conjunctiva non-erythematous Neck: supple Cardiovascular: Tachycardic, no murmurs gallops or rubs Pulmonary/Chest: normal work of breathing on room air MSK: normal bulk and tone Neurological: alert & oriented x 3 Skin: warm and dry Psych: Normal mood and thought process Gynecological exam: Unremarkable external examination.  No vulvar lesions present.  Labia unremarkable.  Vaginal canal filled with moderate amount of white thin discharge and small amount of bright red blood on vaginal canal wall and mixed in with discharge.  Also with 1 to 2  mm mass on the 3 o'clock position of the vaginal canal wall.   Female nursing staff present during patient's pelvic examination   Assessment & Plan:   See Encounters Tab for problem based charting.  Patient discussed with Dr.  Butch Penny, D.O. [facility], PGY-2 Pager: [phone], Phone: [phone] Date 04/25/2021 Time 9:00 AM

## 2021-04-25 NOTE — Assessment & Plan Note (Addendum)
Assessment: Patient endorses history of recurrent BV, yeast and trichomonas infections.  She states that she would have episodes every few weeks to months, at that time she was douching regularly.  When she stopped douching states that the frequency decreased.  Her last BV episode was 2 years ago.  She states that her current symptoms of vaginal itching and clear thin discharge for the past 3 days is consistent with BV she has had in the past.  She denies any new sexual partners and states she is monogamous with her husband.  She denies any new soaps that she uses intravaginally.  Denies douching recently.  Denies any change in the medications.  Denies any systemic symptoms of illness such as fever chills nausea vomiting diarrhea.  She has a hx of acute blood loss anemia 2/2 abnormal uterine bleeding in 2003, which resulted in emergency hysterectomy.  Vaginal exam shows positive for pain with use of regular size speculum, when switching to smaller speculum patient denied as much discomfort.  She did have a normal external vaginal exam.  Was positive for white thin discharge as well as bright red blood.  Also noted to have a 1 to 2 mm mass at the 3 o'clock position of the vaginal canal wall.  Because of the examination findings, will send sample for BV, trichomoniasis, GC chlamydia and get an HIV examination.  Will call patient with results and treat as needed.  Discussed using Tylenol for any vaginal pain, pads for any increased discharge or bleeding and for any itching she can use over-the-counter creams externally.  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  Will order patient metronidazole 500 mg twice daily for 7 days.  Will call and discussed with patient recommend that her  partner also be tested and treated, this may explain her recurrence.  HIV negative.

## 2021-04-25 NOTE — Assessment & Plan Note (Signed)
Assessment: Prescribed rosuvastatin 40 mg daily.  Patient states she has been unable to afford her rosuvastatin until she was able to have health insurance recently.  She is only been taking her rosuvastatin for the past week.  Encourage patient to continue to take this and follow-up in our clinic in 4 weeks for repeat lipid panel study.  Plan: -Continue rosuvastatin 40 mg daily -Repeat lipid panel in 4 weeks

## 2021-04-26 LAB — CERVICOVAGINAL ANCILLARY ONLY
Bacterial Vaginitis (gardnerella): POSITIVE — AB
Candida Glabrata: NEGATIVE
Candida Vaginitis: NEGATIVE
Chlamydia: NEGATIVE
Comment: NEGATIVE
Comment: NEGATIVE
Comment: NEGATIVE
Comment: NEGATIVE
Comment: NEGATIVE
Comment: NORMAL
Neisseria Gonorrhea: NEGATIVE
Trichomonas: POSITIVE — AB

## 2021-04-26 LAB — HIV ANTIBODY (ROUTINE TESTING W REFLEX): HIV Screen 4th Generation wRfx: NONREACTIVE

## 2021-04-26 MED ORDER — METRONIDAZOLE 500 MG PO TABS: 500.0000 mg | ORAL_TABLET | Freq: Two times a day (BID) | ORAL | 0 refills | Status: AC

## 2021-04-26 NOTE — Progress Notes (Signed)
Internal Medicine Clinic Attending  Case discussed with Dr. Katsadouros  At the time of the visit.  We reviewed the resident's history and exam and pertinent patient test results.  I agree with the assessment, diagnosis, and plan of care documented in the resident's note.  

## 2021-04-26 NOTE — Addendum Note (Signed)
Addended by: Riesa Pope on: 04/26/2021 05:28 PM   Modules accepted: Orders

## 2021-05-20 ENCOUNTER — Ambulatory Visit: Payer: 59 | Admitting: Cardiology

## 2021-05-22 NOTE — Progress Notes (Deleted)
4 wk follow-up for HTN, cholesterol Seen 10/13   HLD Meds: was not taking rosuvastatin in may, was restarted on Rosuvastatin 40 mg. Lab Results  Component Value Date   CHOL 328 (H) 11/20/2020   HDL 41 11/20/2020   LDLCALC 224 (H) 11/20/2020   TRIG 303 (H) 11/20/2020   CHOLHDL 8.0 (H) 11/20/2020  Plan: -lipid panel  HTN Home blood pressure cuff? Medications: Lisinopril-HCTZ 20-12.5 mg qd, has she been taking dilt?? Plan: -BMP  Vaginal discharge- + for BV and trich, treated with metro 500 BID 7 days, did her partnes get tested?   1 to 2 mm mass on 3 o'clock position of vaginal canal wall, was referred to oncology.  Tachycardia on 03/2021, she was supposed to see cardiology 11/07 but visit was canceled. -referral to cards

## 2021-05-23 ENCOUNTER — Encounter: Payer: 59 | Admitting: Internal Medicine

## 2021-05-29 ENCOUNTER — Other Ambulatory Visit: Payer: Self-pay

## 2021-05-29 ENCOUNTER — Ambulatory Visit (INDEPENDENT_AMBULATORY_CARE_PROVIDER_SITE_OTHER): Payer: 59 | Admitting: Internal Medicine

## 2021-05-29 ENCOUNTER — Encounter: Payer: Self-pay | Admitting: Internal Medicine

## 2021-05-29 VITALS — BP 133/79 | HR 89 | Temp 99.4°F | Resp 24 | Ht 65.0 in | Wt 239.1 lb

## 2021-05-29 DIAGNOSIS — E1169 Type 2 diabetes mellitus with other specified complication: Secondary | ICD-10-CM

## 2021-05-29 DIAGNOSIS — E669 Obesity, unspecified: Secondary | ICD-10-CM

## 2021-05-29 DIAGNOSIS — Z6839 Body mass index (BMI) 39.0-39.9, adult: Secondary | ICD-10-CM

## 2021-05-29 DIAGNOSIS — R739 Hyperglycemia, unspecified: Secondary | ICD-10-CM

## 2021-05-29 DIAGNOSIS — E782 Mixed hyperlipidemia: Secondary | ICD-10-CM

## 2021-05-29 DIAGNOSIS — L659 Nonscarring hair loss, unspecified: Secondary | ICD-10-CM | POA: Diagnosis not present

## 2021-05-29 DIAGNOSIS — E119 Type 2 diabetes mellitus without complications: Secondary | ICD-10-CM | POA: Insufficient documentation

## 2021-05-29 DIAGNOSIS — R000 Tachycardia, unspecified: Secondary | ICD-10-CM

## 2021-05-29 DIAGNOSIS — I10 Essential (primary) hypertension: Secondary | ICD-10-CM | POA: Diagnosis not present

## 2021-05-29 DIAGNOSIS — E1165 Type 2 diabetes mellitus with hyperglycemia: Secondary | ICD-10-CM

## 2021-05-29 DIAGNOSIS — N898 Other specified noninflammatory disorders of vagina: Secondary | ICD-10-CM

## 2021-05-29 NOTE — Assessment & Plan Note (Addendum)
Lab Results  Component Value Date   CHOL 328 (H) 11/20/2020   HDL 41 11/20/2020   LDLCALC 224 (H) 11/20/2020   TRIG 303 (H) 11/20/2020   CHOLHDL 8.0 (H) 11/20/2020  Patient presents with hyperlipidemia.  She now has health insurance that helps to cover rosuvastatin, but states she has continued to take this medication about once a week because it makes her stomach hurt.  She has been taking it at night right before she goes to bed.   Plan: - repeat lipid panel with LDL of 215, called and updated her with results - Asked patient to try taking medication with a meal to help with stomach cramps.

## 2021-05-29 NOTE — Assessment & Plan Note (Signed)
Patient presents for four week follow-up after a vaginal canal mass was seen during pelvic exam completed to evaluate for vaginal itching and clear thin discharge.  Results were consistent with BV and trichomonas.  Patient completed 7 day course of metronidazole. She has not been scheduled to follow-up with OBGYN although the referral had been sent.    Plan: I spoke with [facility] front office staff and patient will need to call [facility] [phone]) to schedule an appointment.  I called to give patient name and number of clinic and asked her to try to call today or tomorrow.

## 2021-05-29 NOTE — Progress Notes (Signed)
Subjective:  CC: follow-up for vaginal mass, hyperlipidemia, hypertension, and hair loss  HPI:  [name] is a 55 y.o. female with a past medical history stated below and presents today for follow-up for vaginal mass, hyperlipidemia, hypertension, and hair loss. Please see problem based assessment and plan for additional details.  Past Medical History:  Diagnosis Date   Breast pain 09/08/2017   Depression    GERD (gastroesophageal reflux disease)    HTN (hypertension)    Hyperlipemia    Leiomyoma of uterus    s/p hysterectomy, hx of uterine hemorrhage   Tobacco abuse     Current Outpatient Medications on File Prior to Visit  Medication Sig Dispense Refill   diltiazem (CARDIZEM CD) 120 MG 24 hr capsule Take 1 capsule (120 mg total) by mouth daily. 90 capsule 3   FLUoxetine (PROZAC) 20 MG tablet Take 2 tablets (40 mg total) by mouth daily. 60 tablet 5   lisinopril-hydrochlorothiazide (ZESTORETIC) 20-12.5 MG tablet Take 1 tablet by mouth once daily 30 tablet 2   rosuvastatin (CRESTOR) 40 MG tablet Take 1 tablet (40 mg total) by mouth daily. 30 tablet 5   No current facility-administered medications on file prior to visit.    Family History  Problem Relation Age of Onset   Cancer Mother    Osteoarthritis Mother    Cancer Father     Social History   Socioeconomic History   Marital status: Single    Spouse name: Not on file   Number of children: Not on file   Years of education: Not on file   Highest education level: Not on file  Occupational History   Not on file  Tobacco Use   Smoking status: Former    Packs/day: 1.00    Types: Cigarettes    Quit date: 08/02/2018    Years since quitting: 2.8   Smokeless tobacco: Never  Vaping Use   Vaping Use: Never used  Substance and Sexual Activity   Alcohol use: Yes    Comment: occasional   Drug use: No   Sexual activity: Yes    Birth control/protection: Surgical  Other Topics Concern   Not on file  Social  History Narrative   Not on file   Social Determinants of Health   Financial Resource Strain: Not on file  Food Insecurity: Not on file  Transportation Needs: Not on file  Physical Activity: Not on file  Stress: Not on file  Social Connections: Not on file  Intimate Partner Violence: Not on file    Review of Systems: ROS negative except for what is noted on the assessment and plan.  Objective:   Vitals:   05/29/21 0931 05/29/21 0942  BP: (!) 122/91 133/79  Pulse: 92 89  Resp: (!) 24   Temp: 99.4 F (37.4 C)   TempSrc: Oral   SpO2: 100%   Weight: 239 lb 1.6 oz (108.5 kg)   Height: 5\' 5"  (1.651 m)     Physical Exam: Gen: A&O x3 and in no apparent distress, well appearing and nourished. HEENT:    Head - hair loss localized to anterior hairline, no erythema, scales, oor excoriations present on scalp, 2 cm mass present on top of left earlobe   Eye - visual acuity grossly intact, conjunctiva clear, sclera non-icteric, EOM intact.    Mouth - No obvious caries or periodontal disease. Neck: no masses or nodules, AROM intact. CV: RRR, no murmurs, S1/S2 presents  Resp: Clear to ascultation bilaterally  Abd: BS (+) x4, soft, non-tender abdomen, without hepatosplenomegaly or masses MSK: Grossly normal AROM and strength x4 extremities. Skin: good skin turgor, no rashes, unusual bruising, or prominent lesions.  Neuro: No focal deficits, grossly normal sensation and coordination.  Psych: Oriented x3 and responding appropriately. Intact memory, normal mood, judgement, affect, and insight.    Assessment & Plan:  See Encounters Tab for problem based charting.  Patient seen with Dr. Hansel Starling Ivania Teagarden, D.O. [facility]  PGY-1 Pager: [phone]  Phone: [phone] Date 05/30/2021  Time 9:52 AM

## 2021-05-29 NOTE — Patient Instructions (Addendum)
Ms.Danelia L Breton, it was a pleasure seeing you today!  Today we discussed: Blood pressure Please continue taking Lisinopril-HCTZ, your blood pressure was well controlled today!  Elevated Heart rate Please follow-up with cardiology for this.  We may consider adding a medication for Heart rate and to prevent migraines if it is elevated at next visit.  Vaginal Mass I will touch pass with out front office to see about the referral that was made.  Cholesterol Please try taking medications after eating to help with symptoms. We are checking cholesterol today and I will call you with results in a few days.  Weight loss I have referred you to Internal Medicine Clinic Registered Dietician, she is a great source of information on changes that you can make.  Hair loss We are getting blood work for thyroid today.  You can also start taking over the counter vitamin B supplements to help with this.  I have ordered the following labs today:  Lab Orders         TSH         BMP8+Anion Gap         Lipid Profile        Referrals ordered today:   Referral Orders         Amb Referral to DSME/T       I have ordered the following medication/changed the following medications:   Stop the following medications: There are no discontinued medications.   Start the following medications: No orders of the defined types were placed in this encounter.   Follow-up: 1 month  Please make sure to arrive 15 minutes prior to your next appointment. If you arrive late, you may be asked to reschedule.   We look forward to seeing you next time. Please call our clinic at [phone] if you have any questions or concerns. The best time to call is Monday-Friday from 9am-4pm, but there is someone available 24/7. If after hours or the weekend, call the main hospital number and ask for the Internal Medicine Resident On-Call. If you need medication refills, please notify your pharmacy one week in advance and they  will send [place] a request.  Thank you for letting us take part in your care. Wishing you the best!  Thank you, Dr. [facility]

## 2021-05-29 NOTE — Assessment & Plan Note (Addendum)
Patient presents for follow-up on hypertension.  She takes Lisinopril-HCTZ 20-25 mg and reports taking this medication daily.   BP Readings from Last 3 Encounters:  05/29/21 133/79  04/25/21 133/69  03/27/21 (!) 113/91  She has not been checking her blood pressure at home. Blood pressure is well controlled while taking Lisinopril-HCTZ 20-25 mg.   Plan: -Continue Lisinopril-HCTZ 20-25 mg -BMP completed 11/16 with Creatinine of 0.6 and K of 4.1 -called and updated patient on labs

## 2021-05-29 NOTE — Assessment & Plan Note (Addendum)
Patient presents with history of obesity, BMI at 39.79. She states she would like to try to lose weight.  She has tried to start cutting out soda.  Her diet consists of coffee for breakfast, fried fish and chicken for lunch and dinner.  She does not like most types of vegetables.    Plan: -referral to internal medicine clinic registered dietician -will order a1c for screening in setting of hyperglycemia in past and obesity.   ADDENDUM 11/29: Lab Results  Component Value Date   HGBA1C 8.5 (A) 06/11/2021   Called and updated patient on test results and new diagnosis of diabetes.  Spoke with her about pathogenesis of type 2 diabetes and importance of working towards controlling blood sugar.    Plan: -start metformin 500 mg qd, can titrate at next office visit as tolerated -She would likely benefit from Tirzepatide vs GLP-1 with goal of weight loss, consider adding at next office visit.

## 2021-05-29 NOTE — Assessment & Plan Note (Addendum)
Patient presents with history of tachycardia.  She was started on Cardizem 120 mg in April 2022 due to tachycardia, but discontinued medication because she felt that this was causing daily headaches.  Headaches have stopped since discontinuing Cardizem. She is scheduled to see cardiology on 06/26/21.  Today, her heart rate is not tachycardic at 92.  Plan: -follow-up with cardiology -discontinue Cardizem 120 mg qd -May consider adding beta blocker if heart rate is elevated and if patient has had more headaches.

## 2021-05-30 DIAGNOSIS — R739 Hyperglycemia, unspecified: Secondary | ICD-10-CM | POA: Insufficient documentation

## 2021-05-30 DIAGNOSIS — L659 Nonscarring hair loss, unspecified: Secondary | ICD-10-CM | POA: Insufficient documentation

## 2021-05-30 HISTORY — DX: Nonscarring hair loss, unspecified: L65.9

## 2021-05-30 LAB — LIPID PANEL
Chol/HDL Ratio: 7.4 ratio — ABNORMAL HIGH (ref 0.0–4.4)
Cholesterol, Total: 283 mg/dL — ABNORMAL HIGH (ref 100–199)
HDL: 38 mg/dL — ABNORMAL LOW (ref >39)
LDL Chol Calc (NIH): 215 mg/dL — ABNORMAL HIGH (ref 0–99)
Triglycerides: 160 mg/dL — ABNORMAL HIGH (ref 0–149)
VLDL Cholesterol Cal: 30 mg/dL (ref 5–40)

## 2021-05-30 LAB — BMP8+ANION GAP
Anion Gap: 17 mmol/L (ref 10.0–18.0)
BUN/Creatinine Ratio: 9 (ref 9–23)
BUN: 6 mg/dL (ref 6–24)
CO2: 26 mmol/L (ref 20–29)
Calcium: 10.3 mg/dL — ABNORMAL HIGH (ref 8.7–10.2)
Chloride: 96 mmol/L (ref 96–106)
Creatinine, Ser: 0.66 mg/dL (ref 0.57–1.00)
Glucose: 120 mg/dL — ABNORMAL HIGH (ref 70–99)
Potassium: 4.1 mmol/L (ref 3.5–5.2)
Sodium: 139 mmol/L (ref 134–144)
eGFR: 105 mL/min/{1.73_m2} (ref >59)

## 2021-05-30 LAB — TSH: TSH: 1.6 u[IU]/mL (ref 0.450–4.500)

## 2021-05-30 NOTE — Assessment & Plan Note (Signed)
Ann Stone reports that she has had continued hair loss.  She has noticed the hair shafts breaking, as well as twists coming out at the roots.  She states that she plans to have twists removed next week.  On exam, she has localized hair loss along hair line, no scales, erythema, or excoriations present on scalp. Differentials include traction alopecia or thyroid disorder  Plan: -TSH within normal limits -follow-up in four weeks

## 2021-06-02 ENCOUNTER — Other Ambulatory Visit: Payer: Self-pay

## 2021-06-02 ENCOUNTER — Encounter (HOSPITAL_COMMUNITY): Payer: Self-pay

## 2021-06-02 ENCOUNTER — Ambulatory Visit (HOSPITAL_COMMUNITY)
Admission: EM | Admit: 2021-06-02 | Discharge: 2021-06-02 | Disposition: A | Discharge status: Home / Self Care | Payer: 59 | Attending: Emergency Medicine | Admitting: Emergency Medicine

## 2021-06-02 DIAGNOSIS — J111 Influenza due to unidentified influenza virus with other respiratory manifestations: Secondary | ICD-10-CM

## 2021-06-02 MED ORDER — PSEUDOEPH-BROMPHEN-DM 30-2-10 MG/5ML PO SYRP: 10.0000 mL | ORAL_SOLUTION | Freq: Four times a day (QID) | ORAL | 0 refills | Status: DC | PRN

## 2021-06-02 MED ORDER — MAGIC MOUTHWASH W/LIDOCAINE: 5.0000 mL | Freq: Four times a day (QID) | ORAL | 0 refills | Status: DC

## 2021-06-02 NOTE — ED Provider Notes (Signed)
[facility]  ____________________________________________  Time seen: Approximately 10:29 AM  I have reviewed the triage vital signs and the nursing notes.   HISTORY  Chief Complaint Sore Throat (/)    HPI Ann Stone is a 55 y.o. female who presents to the urgent care with her son for complaint of fevers, body aches, congestion, sore throat.  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  Patient's child who also goes to daycare has similar symptoms.  Patient denies any difficulty breathing, chest pain, headache, neck pain or stiffness, GI complaints.  No medications prior to arrival.       Past Medical History:  Diagnosis Date   Breast pain 09/08/2017   Depression    GERD (gastroesophageal reflux disease)    HTN (hypertension)    Hyperlipemia    Leiomyoma of uterus    s/p hysterectomy, hx of uterine hemorrhage   Tobacco abuse     Patient Active Problem List   Diagnosis Date Noted   Hair loss 05/30/2021   Obesity 05/29/2021   Vaginal mass 04/25/2021   Ear pain, left 03/27/2021   Tachycardia 10/16/2020   Sebaceous cyst 10/08/2020   Preventative health care 10/06/2016   Depression 07/22/2010   Hyperlipidemia 08/02/2008   Tobacco use disorder, moderate, in sustained remission 06/29/2006   Hypertension, essential 06/29/2006    Past Surgical History:  Procedure Laterality Date   ABDOMINAL HYSTERECTOMY     OPERATIVE HYSTEROSCOPY      Prior to Admission medications   Medication Sig Start Date End Date Taking? Authorizing Provider  brompheniramine-pseudoephedrine-DM 30-2-10 MG/5ML syrup Take 10 mLs by mouth 4 (four) times daily as needed. 06/02/21  Yes Robinette Esters, Charline Bills, PA-C  magic mouthwash w/lidocaine SOLN Take 5 mLs by mouth 4 (four) times daily. Gargle and spit 06/02/21  Yes Akiva Brassfield, Charline Bills, PA-C  diltiazem (CARDIZEM CD) 120 MG 24 hr capsule Take 1 capsule (120 mg total) by mouth daily.  03/27/21 06/25/21  Maudie Mercury, MD  FLUoxetine (PROZAC) 20 MG tablet Take 2 tablets (40 mg total) by mouth daily. 11/20/20   Asencion Noble, MD  lisinopril-hydrochlorothiazide (ZESTORETIC) 20-12.5 MG tablet Take 1 tablet by mouth once daily 04/08/21   Velna Ochs, MD  rosuvastatin (CRESTOR) 40 MG tablet Take 1 tablet (40 mg total) by mouth daily. 11/27/20 11/27/21  Asencion Noble, MD    Allergies Influenza vaccines, Metoprolol tartrate, and Penicillins  Family History  Problem Relation Age of Onset   Cancer Mother    Osteoarthritis Mother    Cancer Father     Social History Social History   Tobacco Use   Smoking status: Former    Packs/day: 1.00    Types: Cigarettes    Quit date: 08/02/2018    Years since quitting: 2.8   Smokeless tobacco: Never  Vaping Use   Vaping Use: Never used  Substance Use Topics   Alcohol use: Yes    Comment: occasional   Drug use: Never     Review of Systems  Constitutional: Positive fever/chills.  Positive for body aches Eyes: No visual changes. No discharge ENT: Positive for nasal congestion, sore throat Cardiovascular: no chest pain. Respiratory: Positive cough. No SOB. Gastrointestinal: No abdominal pain.  No nausea, no vomiting.  No diarrhea.  No constipation. Musculoskeletal: Negative for musculoskeletal pain. Skin: Negative for rash, abrasions, lacerations, ecchymosis. Neurological: Negative for headaches, focal weakness or numbness.  10 System ROS otherwise negative.  ____________________________________________   PHYSICAL EXAM:  VITAL SIGNS: ED Triage Vitals  Enc Vitals Group     BP      Pulse      Resp      Temp      Temp src      SpO2      Weight      Height      Head Circumference      Peak Flow      Pain Score      Pain Loc      Pain Edu?      Excl. in [place]?      Constitutional: Alert and oriented. Well appearing and in no acute distress. Eyes: Conjunctivae are normal. PERRL. EOMI. Head:  Atraumatic. ENT:      Ears:       Nose: No congestion/rhinnorhea.      Mouth/Throat: Mucous membranes are moist.  Neck: No stridor.  No tenderness to palpation.  Neck is supple with forage motion. Hematological/Lymphatic/Immunilogical: No cervical lymphadenopathy. Cardiovascular: Normal rate, regular rhythm. Normal S1 and S2.  Good peripheral circulation. Respiratory: Normal respiratory effort without tachypnea or retractions. Lungs CTAB. Good air entry to the bases with no decreased or absent breath sounds. Gastrointestinal: Bowel sounds 4 quadrants. Soft and nontender to palpation. No guarding or rigidity. No palpable masses. No distention. No CVA tenderness. Musculoskeletal: Full range of motion to all extremities. No gross deformities appreciated. Neurologic:  Normal speech and language. No gross focal neurologic deficits are appreciated.  Skin:  Skin is warm, dry and intact. No rash noted. Psychiatric: Mood and affect are normal. Speech and behavior are normal. Patient exhibits appropriate insight and judgement.   ____________________________________________   LABS (all labs ordered are listed, but only abnormal results are displayed)  Labs Reviewed - No data to display ____________________________________________  EKG   ____________________________________________  RADIOLOGY   No results found.  ____________________________________________    PROCEDURES  Procedure(s) performed:    Procedures    Medications - No data to display   ____________________________________________   INITIAL IMPRESSION / ASSESSMENT AND PLAN / ED COURSE  Pertinent labs & imaging results that were available during my care of the patient were reviewed by me and considered in my medical decision making (see chart for details).  Review of the  [place]RS was performed in accordance of the [place] prior to dispensing any controlled drugs.           Patient's diagnosis is consistent  with viral illness.  Patient presents urgent care with child for symptoms consistent with influenza.  Exam was overall reassuring.  Discussed performing swabs and the patient states that she primarily just needs a note for work given her exposure and symptoms.  I feel this is reasonable as it would not change the patient likely diagnosis or treatment plan.  Patient will have symptom control medication at this time.  Tylenol Motrin, plenty of fluids, plenty of rest at home.  Return precautions discussed with the patient.. Patient is given ED precautions to return to the ED for any worsening or new symptoms.     ____________________________________________  FINAL CLINICAL IMPRESSION(S) / DIAGNOSES  Final diagnoses:  Influenza      NEW MEDICATIONS STARTED DURING THIS VISIT:  ED Discharge Orders          Ordered    brompheniramine-pseudoephedrine-DM 30-2-10 MG/5ML syrup  4 times daily PRN        06/02/21 1057    magic mouthwash w/lidocaine SOLN  4 times daily       Note to Pharmacy: Dispense in a 1/1/1 ratio. Use lidocaine, diphenhydramine, prednisolone   06/02/21 1057                This chart was dictated using voice recognition software/Dragon. Despite best efforts to proofread, errors can occur which can change the meaning. Any change was purely unintentional.    Darletta Moll, PA-C 06/02/21 1057

## 2021-06-02 NOTE — ED Triage Notes (Signed)
Pt reports sore throat, cough and joint pain x 3 days.

## 2021-06-03 NOTE — Progress Notes (Signed)
Internal Medicine Clinic Attending  I saw and evaluated the patient.  I personally confirmed the key portions of the history and exam documented by Dr. Howie Ill and I reviewed pertinent patient test results.  The assessment, diagnosis, and plan were formulated together and I agree with the documentation in the resident's note.  Hair shafts are brittle and breaking where the twists are attached.  Most pronounced at the hairline though present generally over scalp.

## 2021-06-11 ENCOUNTER — Other Ambulatory Visit (INDEPENDENT_AMBULATORY_CARE_PROVIDER_SITE_OTHER): Payer: 59

## 2021-06-11 DIAGNOSIS — E119 Type 2 diabetes mellitus without complications: Secondary | ICD-10-CM | POA: Insufficient documentation

## 2021-06-11 DIAGNOSIS — E1165 Type 2 diabetes mellitus with hyperglycemia: Secondary | ICD-10-CM | POA: Diagnosis not present

## 2021-06-11 DIAGNOSIS — R739 Hyperglycemia, unspecified: Secondary | ICD-10-CM

## 2021-06-11 LAB — GLUCOSE, CAPILLARY: Glucose-Capillary: 209 mg/dL — ABNORMAL HIGH (ref 70–99)

## 2021-06-11 LAB — POCT GLYCOSYLATED HEMOGLOBIN (HGB A1C): Hemoglobin A1C: 8.5 % — AB (ref 4.0–5.6)

## 2021-06-11 MED ORDER — METFORMIN HCL 500 MG PO TABS: 500.0000 mg | ORAL_TABLET | Freq: Every day | ORAL | 11 refills | Status: DC

## 2021-06-11 NOTE — Addendum Note (Signed)
Addended by: Edwyna Perfect on: 06/11/2021 04:54 PM   Modules accepted: Orders

## 2021-06-24 ENCOUNTER — Encounter: Payer: 59 | Admitting: Internal Medicine

## 2021-06-25 NOTE — Progress Notes (Signed)
Cardiology Office Note:    Date:  06/26/2021   ID:  Ann Stone, DOB [birth year], MRN [id number]  PCP:  Marianna Payment, MD   [place] Providers Cardiologist:  Janina Mayo, MD     Referring MD: Angelica Pou, MD   No chief complaint on file. Tachycardia  History of Present Illness:    Ann Stone is a 55 y.o. female with a hx of DM2 A1c 8.5%,  HTN,HLD, referral for tachycardia  She states that ever since she can remember she has had a fast pulse. If she walks into the hospital, her pulse is high. She feels that she is used to it. 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 She says with her visits it is elevated. Denies LH or dizziness. No syncope.No DOE. Reports deconditioning. She denies chest pressure. She has GERD. She drinks coffee 1 cup per day.   She has hypertension and on lisinopril 20 mg and HCTz 12.5 mg. She is going to see a nutritionist for weight loss.  She has hx of fibroids and she had hysterectomy around 2002. No children. She has hot flashes, has not menstrual cycle in 20 years. Her ovaries are there. No HT  Seen in the ED 06/02/2021 for viral illness.   EKG 03/24/2017- NSR  Family Hx: Brother died of MI 31s, Aunt died of MI and her grandmother with MI.  Social Hx: Stopped smoking 6 yeas, 1 ppd for 18 years  Labs 05/29/2021 HDL 38 LDL 215 TC 283 A1c 8.5% TSH 1.6 Crt 0.6    Past Medical History:  Diagnosis Date   Breast pain 09/08/2017   Depression    GERD (gastroesophageal reflux disease)    HTN (hypertension)    Hyperlipemia    Leiomyoma of uterus    s/p hysterectomy, hx of uterine hemorrhage   Tobacco abuse     Past Surgical History:  Procedure Laterality Date   ABDOMINAL HYSTERECTOMY     OPERATIVE HYSTEROSCOPY      Current Medications: Current Meds  Medication Sig   ezetimibe (ZETIA) 10 MG tablet Take 1 tablet (10 mg total) by mouth daily.   FLUoxetine (PROZAC) 20 MG tablet Take 2 tablets (40 mg  total) by mouth daily.   lisinopril-hydrochlorothiazide (ZESTORETIC) 20-12.5 MG tablet Take 1 tablet by mouth once daily   metFORMIN (GLUCOPHAGE) 500 MG tablet Take 1 tablet (500 mg total) by mouth daily with breakfast.   rosuvastatin (CRESTOR) 40 MG tablet Take 1 tablet (40 mg total) by mouth daily.     Allergies:   Influenza vaccines, Metoprolol tartrate, and Penicillins   Social History   Socioeconomic History   Marital status: Single    Spouse name: Not on file   Number of children: Not on file   Years of education: Not on file   Highest education level: Not on file  Occupational History   Not on file  Tobacco Use   Smoking status: Former    Packs/day: 1.00    Types: Cigarettes    Quit date: 08/02/2018    Years since quitting: 2.9   Smokeless tobacco: Never  Vaping Use   Vaping Use: Never used  Substance and Sexual Activity   Alcohol use: Yes    Comment: occasional   Drug use: Never   Sexual activity: Yes    Birth control/protection: Surgical  Other Topics Concern   Not on file  Social History Narrative   Not on file  Social Determinants of Health   Financial Resource Strain: Not on file  Food Insecurity: Not on file  Transportation Needs: Not on file  Physical Activity: Not on file  Stress: Not on file  Social Connections: Not on file     Family History: The patient's family history includes Cancer in her father and mother; Osteoarthritis in her mother.  ROS:   Please see the history of present illness.     All other systems reviewed and are negative.  EKGs/Labs/Other Studies Reviewed:    The following studies were reviewed today:   EKG:  EKG is  ordered today.  The ekg ordered today demonstrates   NSR, LVH  Recent Labs: 05/29/2021: BUN 6; Creatinine, Ser 0.66; Potassium 4.1; Sodium 139; TSH 1.600  Recent Lipid Panel    Component Value Date/Time   CHOL 283 (H) 05/29/2021 1051   TRIG 160 (H) 05/29/2021 1051   HDL 38 (L) 05/29/2021 1051    CHOLHDL 7.4 (H) 05/29/2021 1051   CHOLHDL 5.2 10/29/2011 1522   VLDL 39 10/29/2011 1522   LDLCALC 215 (H) 05/29/2021 1051     Risk Assessment/Calculations:     The 10-year ASCVD risk score (Arnett DK, et al., 2019) is: 22.1%   Values used to calculate the score:     Age: 68 years     Sex: Female     Is Non-Hispanic African American: Yes     Diabetic: Yes     Tobacco smoker: No     Systolic Blood Pressure: 220 mmHg     Is BP treated: Yes     HDL Cholesterol: 38 mg/dL     Total Cholesterol: 283 mg/dL       Physical Exam:    VS:    Vitals:   06/26/21 0954  BP: 132/84  Pulse: 87  SpO2: 98%     Wt Readings from Last 3 Encounters:  06/26/21 231 lb 9.6 oz (105.1 kg)  05/29/21 239 lb 1.6 oz (108.5 kg)  04/25/21 243 lb (110.2 kg)     GEN:  Well nourished, well developed in no acute distress HEENT: Normal NECK: No JVD; No carotid bruits LYMPHATICS: No lymphadenopathy CARDIAC: RRR, no murmurs, rubs, gallops RESPIRATORY:  Clear to auscultation without rales, wheezing or rhonchi  ABDOMEN: Soft, non-tender, non-distended MUSCULOSKELETAL:  No edema; No deformity  SKIN: Warm and dry NEUROLOGIC:  Alert and oriented x 3 PSYCHIATRIC:  Normal affect   ASSESSMENT:    #Palpitations:  She reports high rates when going to her primary's office. Rates are normal here. She does not have high risk features including syncope c/f arrhythmia , family hx of SCD, or abnormalities on her EKG. Will Stone for 7 day monitor. If benign will have her follow up as needed.  #HTN: good control. Recommend continuing lisinopril-Hctz. Goal < 130/80 mmHg.  #HLD: Discussed physical activity and weight loss. She is seeing a nutritionist. On Crestor 40. LDL 215 which is very high. Goal  < <70 with risk factors including DMII, HTN, former smoker. If no improvement with max statin and zetia, will ask for pharmacy assistance for PCSK9.   #DM2: 8.5% continue metformin  Stone:    In order of problems listed  above:  Ziopatch 7 days Zetia  Lipid profile in 8 weeks Follow up PRN     Medication Adjustments/Labs and Tests Ordered: Current medicines are reviewed at length with the patient today.  Concerns regarding medicines are outlined above.  Orders Placed This Encounter  Procedures  Lipid panel   LONG TERM MONITOR (3-14 DAYS)   EKG 12-Lead   Meds ordered this encounter  Medications   ezetimibe (ZETIA) 10 MG tablet    Sig: Take 1 tablet (10 mg total) by mouth daily.    Dispense:  90 tablet    Refill:  3    Patient Instructions  Medication Instructions:  PLEASE START ZETIA 10mg  ONCE DAILY *If you need a refill on your cardiac medications before your next appointment, please call your pharmacy*  PLEASE RETURN IN 8 WEEKS FOR REPEAT LIPID PANEL-YOU WILL NEED TO BE FASTING. NO LAB APPOINTMENT NEEDED- LAB HOURS ARE Monday-Friday from 8am-4pm  Testing/Procedures:  ZIO XT- Long Term Monitor Instructions   Your physician has requested you wear your ZIO patch monitor 7 days.   This is a single patch monitor.  [employer] supplies one patch monitor per enrollment.  Additional stickers are not available.   Please do not apply patch if you will be having a Nuclear Stress Test, Echocardiogram, Cardiac CT, MRI, or Chest Xray during the time frame you would be wearing the monitor. The patch cannot be worn during these tests.  You cannot remove and re-apply the ZIO XT patch monitor.   Your ZIO patch monitor will be sent USPS Priority mail from [facility] directly to your home address. The monitor may also be mailed to a PO BOX if home delivery is not available.   It may take 3-5 days to receive your monitor after you have been enrolled.   Once you have received you monitor, please review enclosed instructions.  Your monitor has already been registered assigning a specific monitor serial # to you.   Applying the monitor   Shave hair from upper left chest.   Hold abrader disc by orange  tab.  Rub abrader in 40 strokes over left upper chest as indicated in your monitor instructions.   Clean area with 4 enclosed alcohol pads .  Use all pads to assure are is cleaned thoroughly.  Let dry.   Apply patch as indicated in monitor instructions.  Patch will be place under collarbone on left side of chest with arrow pointing upward.   Rub patch adhesive wings for 2 minutes.Remove white label marked "1".  Remove white label marked "2".  Rub patch adhesive wings for 2 additional minutes.   While looking in a mirror, press and release button in center of patch.  A small green light will flash 3-4 times .  This will be your only indicator the monitor has been turned on.     Do not shower for the first 24 hours.  You may shower after the first 24 hours.   Press button if you feel a symptom. You will hear a small click.  Record Date, Time and Symptom in the Patient Log Book.   When you are ready to remove patch, follow instructions on last 2 pages of Patient Log Book.  Stick patch monitor onto last page of Patient Log Book.   Place Patient Log Book in [place] box.  Use locking tab on box and tape box closed securely.  The [employer] and [employer] has [employer] on it.  Please place in mailbox as soon as possible.  Your physician should have your test results approximately 7 days after the monitor has been mailed back to [facility].   Call [place] at [phone] if you have questions regarding your ZIO XT patch monitor.  Call them immediately if  you see an orange light blinking on your monitor.   If your monitor falls off in less than 4 days contact our Monitor department at [phone].  If your monitor becomes loose or falls off after 4 days call [employer] at [phone] for suggestions on securing your monitor.    Follow-Up: At [facility], you and your health needs are our priority.  As part of our continuing mission to provide you with exceptional heart care,  we have created designated Provider Care Teams.  These Care Teams include your primary Cardiologist (physician) and Advanced Practice Providers (APPs -  Physician Assistants and Nurse Practitioners) who all work together to provide you with the care you need, when you need it.  Your next appointment:   AS NEEDED   The format for your next appointment:   In Person  Provider:   Janina Mayo, MD     Signed, Janina Mayo, MD  06/26/2021 10:27 AM    [place]

## 2021-06-26 ENCOUNTER — Ambulatory Visit: Payer: 59

## 2021-06-26 ENCOUNTER — Encounter: Payer: Self-pay | Admitting: Internal Medicine

## 2021-06-26 ENCOUNTER — Ambulatory Visit (INDEPENDENT_AMBULATORY_CARE_PROVIDER_SITE_OTHER): Payer: 59 | Admitting: Internal Medicine

## 2021-06-26 ENCOUNTER — Other Ambulatory Visit: Payer: Self-pay

## 2021-06-26 VITALS — BP 123/71 | HR 94 | Temp 98.1°F | Ht 65.0 in | Wt 233.4 lb

## 2021-06-26 VITALS — BP 132/84 | HR 87 | Ht 65.0 in | Wt 231.6 lb

## 2021-06-26 DIAGNOSIS — E1169 Type 2 diabetes mellitus with other specified complication: Secondary | ICD-10-CM | POA: Diagnosis not present

## 2021-06-26 DIAGNOSIS — R000 Tachycardia, unspecified: Secondary | ICD-10-CM

## 2021-06-26 DIAGNOSIS — I10 Essential (primary) hypertension: Secondary | ICD-10-CM

## 2021-06-26 DIAGNOSIS — E669 Obesity, unspecified: Secondary | ICD-10-CM

## 2021-06-26 DIAGNOSIS — E785 Hyperlipidemia, unspecified: Secondary | ICD-10-CM

## 2021-06-26 DIAGNOSIS — E782 Mixed hyperlipidemia: Secondary | ICD-10-CM | POA: Diagnosis not present

## 2021-06-26 MED ORDER — SEMAGLUTIDE 3 MG PO TABS: 3.0000 mg | ORAL_TABLET | Freq: Every day | ORAL | 0 refills | Status: DC

## 2021-06-26 MED ORDER — EZETIMIBE 10 MG PO TABS: 10.0000 mg | ORAL_TABLET | Freq: Every day | ORAL | 3 refills | Status: DC

## 2021-06-26 MED ORDER — METFORMIN HCL 500 MG PO TABS: 500.0000 mg | ORAL_TABLET | Freq: Two times a day (BID) | ORAL | 11 refills | Status: DC

## 2021-06-26 NOTE — Progress Notes (Unsigned)
Enrolled patient for a 7 day Zio XT monitor to be mailed to patients home.  

## 2021-06-26 NOTE — Progress Notes (Signed)
Subjective:  CC: HTN  HPI:  [name] is a 55 y.o. female with a past medical history stated below and presents today for HTN. Please see problem based assessment and plan for additional details.  Past Medical History:  Diagnosis Date   Breast pain 09/08/2017   Depression    GERD (gastroesophageal reflux disease)    HTN (hypertension)    Hyperlipemia    Leiomyoma of uterus    s/p hysterectomy, hx of uterine hemorrhage   Tobacco abuse     Current Outpatient Medications on File Prior to Visit  Medication Sig Dispense Refill   ezetimibe (ZETIA) 10 MG tablet Take 1 tablet (10 mg total) by mouth daily. 90 tablet 3   FLUoxetine (PROZAC) 20 MG tablet Take 2 tablets (40 mg total) by mouth daily. 60 tablet 5   lisinopril-hydrochlorothiazide (ZESTORETIC) 20-12.5 MG tablet Take 1 tablet by mouth once daily 30 tablet 2   rosuvastatin (CRESTOR) 40 MG tablet Take 1 tablet (40 mg total) by mouth daily. 30 tablet 5   No current facility-administered medications on file prior to visit.    Family History  Problem Relation Age of Onset   Cancer Mother    Osteoarthritis Mother    Cancer Father     Social History   Socioeconomic History   Marital status: Single    Spouse name: Not on file   Number of children: Not on file   Years of education: Not on file   Highest education level: Not on file  Occupational History   Not on file  Tobacco Use   Smoking status: Former    Packs/day: 1.00    Types: Cigarettes    Quit date: 08/02/2018    Years since quitting: 2.9   Smokeless tobacco: Never  Vaping Use   Vaping Use: Never used  Substance and Sexual Activity   Alcohol use: Yes    Comment: occasional   Drug use: Never   Sexual activity: Yes    Birth control/protection: Surgical  Other Topics Concern   Not on file  Social History Narrative   Not on file   Social Determinants of Health   Financial Resource Strain: Not on file  Food Insecurity: Not on file   Transportation Needs: Not on file  Physical Activity: Not on file  Stress: Not on file  Social Connections: Not on file  Intimate Partner Violence: Not on file    Review of Systems: ROS negative except for what is noted on the assessment and plan.  Objective:   Vitals:   06/26/21 1507 06/26/21 1515  BP: 131/81 123/71  Pulse: 99 94  Temp: 98.1 F (36.7 C)   TempSrc: Oral   SpO2: 97%   Weight: 233 lb 6.4 oz (105.9 kg)   Height: 5\' 5"  (1.651 m)     Physical Exam: Gen: A&O x3 and in no apparent distress, well appearing and nourished. Neck: no masses or nodules, AROM intact. CV: RRR, no murmurs, S1/S2 presents  Resp: Clear to ascultation bilaterally  Abd: BS (+) x4, soft, non-tender abdomen, without hepatosplenomegaly or masses MSK: Grossly normal AROM and strength x4 extremities. Skin: good skin turgor, no rashes, unusual bruising, or prominent lesions.  Neuro: No focal deficits, grossly normal sensation and coordination.     Assessment & Plan:  See Encounters Tab for problem based charting.  Patient discussed with Dr. Hilbert Odor, D.O. [facility]   PGY-3 Pager: [phone]   Phone: [phone]  Date 06/29/2021   Time 9:04 PM

## 2021-06-26 NOTE — Patient Instructions (Addendum)
Thank you, [name] for allowing [place] to provide your care today. Today we discussed Diabetes Mellitus.    Labs/Tests Ordered: Lab Orders  No laboratory test(s) ordered today     Referrals Ordered:  Referral Orders  No referral(s) requested today     Medication Changes:    Meds ordered this encounter  Medications   metFORMIN (GLUCOPHAGE) 500 MG tablet    Sig: Take 1 tablet (500 mg total) by mouth 2 (two) times daily with a meal.    Dispense:  30 tablet    Refill:  11   Semaglutide 3 MG TABS    Sig: Take 3 mg by mouth daily with breakfast.    Dispense:  30 tablet    Refill:  0     Health Maintenance Screening: Diabetes Health Maintenance Due  Topic Date Due   FOOT EXAM  Never done   OPHTHALMOLOGY EXAM  Never done   HEMOGLOBIN A1C  12/09/2021     Instructions:   Follow up: 2 months   Remember: If you have any questions or concerns, call our clinic at [phone] or after hours call [phone] and ask for the internal medicine resident on call.  Marianna Payment, D.O. [place]

## 2021-06-26 NOTE — Patient Instructions (Addendum)
Medication Instructions:  PLEASE START ZETIA 10mg  ONCE DAILY *If you need a refill on your cardiac medications before your next appointment, please call your pharmacy*  PLEASE RETURN IN 8 WEEKS FOR REPEAT LIPID PANEL-YOU WILL NEED TO BE FASTING. NO LAB APPOINTMENT NEEDED- LAB HOURS ARE Monday-Friday from 8am-4pm  Testing/Procedures:  ZIO XT- Long Term Monitor Instructions   Your physician has requested you wear your ZIO patch monitor 7 days.   This is a single patch monitor.  [employer] supplies one patch monitor per enrollment.  Additional stickers are not available.   Please do not apply patch if you will be having a Nuclear Stress Test, Echocardiogram, Cardiac CT, MRI, or Chest Xray during the time frame you would be wearing the monitor. The patch cannot be worn during these tests.  You cannot remove and re-apply the ZIO XT patch monitor.   Your ZIO patch monitor will be sent USPS Priority mail from [facility] directly to your home address. The monitor may also be mailed to a PO BOX if home delivery is not available.   It may take 3-5 days to receive your monitor after you have been enrolled.   Once you have received you monitor, please review enclosed instructions.  Your monitor has already been registered assigning a specific monitor serial # to you.   Applying the monitor   Shave hair from upper left chest.   Hold abrader disc by orange tab.  Rub abrader in 40 strokes over left upper chest as indicated in your monitor instructions.   Clean area with 4 enclosed alcohol pads .  Use all pads to assure are is cleaned thoroughly.  Let dry.   Apply patch as indicated in monitor instructions.  Patch will be place under collarbone on left side of chest with arrow pointing upward.   Rub patch adhesive wings for 2 minutes.Remove white label marked "1".  Remove white label marked "2".  Rub patch adhesive wings for 2 additional minutes.   While looking in a mirror, press and release  button in center of patch.  A small green light will flash 3-4 times .  This will be your only indicator the monitor has been turned on.     Do not shower for the first 24 hours.  You may shower after the first 24 hours.   Press button if you feel a symptom. You will hear a small click.  Record Date, Time and Symptom in the Patient Log Book.   When you are ready to remove patch, follow instructions on last 2 pages of Patient Log Book.  Stick patch monitor onto last page of Patient Log Book.   Place Patient Log Book in [place] box.  Use locking tab on box and tape box closed securely.  The [employer] has [employer] on it.  Please place in mailbox as soon as possible.  Your physician should have your test results approximately 7 days after the monitor has been mailed back to [facility].   Call [place] at [phone] if you have questions regarding your ZIO XT patch monitor.  Call them immediately if you see an orange light blinking on your monitor.   If your monitor falls off in less than 4 days contact our Monitor department at [phone].  If your monitor becomes loose or falls off after 4 days call [employer] at [phone] for suggestions on securing your monitor.    Follow-Up: At [facility], you and your health  needs are our priority.  As part of our continuing mission to provide you with exceptional heart care, we have created designated Provider Care Teams.  These Care Teams include your primary Cardiologist (physician) and Advanced Practice Providers (APPs -  Physician Assistants and Nurse Practitioners) who all work together to provide you with the care you need, when you need it.  Your next appointment:   AS NEEDED   The format for your next appointment:   In Person  Provider:   Janina Mayo, MD

## 2021-06-29 NOTE — Assessment & Plan Note (Signed)
Lipid Panel     Component Value Date/Time   CHOL 283 (H) 05/29/2021 1051   TRIG 160 (H) 05/29/2021 1051   HDL 38 (L) 05/29/2021 1051   CHOLHDL 7.4 (H) 05/29/2021 1051   CHOLHDL 5.2 10/29/2011 1522   VLDL 39 10/29/2011 1522   LDLCALC 215 (H) 05/29/2021 1051   LABVLDL 30 05/29/2021 1051   - On Crestor 40 mg and tolerated it well - Zetia recently added by cardiology

## 2021-06-29 NOTE — Assessment & Plan Note (Signed)
HTN: - lisinopril-HCTZ 20-12.5 mg, denies side effects.   BP Readings from Last 3 Encounters:  06/26/21 123/71  06/26/21 132/84  06/02/21 (!) 155/92

## 2021-06-29 NOTE — Assessment & Plan Note (Signed)
Refilled medicaitons.

## 2021-07-01 ENCOUNTER — Encounter: Payer: 59 | Admitting: Internal Medicine

## 2021-07-01 NOTE — Progress Notes (Signed)
Internal Medicine Clinic Attending  Case discussed with Dr. Marianna Payment  At the time of the visit.  We reviewed the residents history and exam and pertinent patient test results.  I agree with the assessment, diagnosis, and plan of care documented in the residents note. Previous A1c 8.5% (11/29), started on metformin at previous visit. Uptitrating today in addition to adding semaglutide for added benefit of weight loss.

## 2021-07-04 ENCOUNTER — Encounter: Payer: Self-pay | Admitting: Plastic Surgery

## 2021-07-04 ENCOUNTER — Ambulatory Visit (INDEPENDENT_AMBULATORY_CARE_PROVIDER_SITE_OTHER): Payer: 59 | Admitting: Plastic Surgery

## 2021-07-04 ENCOUNTER — Other Ambulatory Visit: Payer: Self-pay

## 2021-07-04 VITALS — BP 140/82 | HR 89 | Ht 65.0 in | Wt 232.6 lb

## 2021-07-04 DIAGNOSIS — L910 Hypertrophic scar: Secondary | ICD-10-CM

## 2021-07-04 NOTE — Progress Notes (Signed)
Referring Provider Marianna Payment, MD [address],  [place] [address]   CC:  Chief Complaint  Patient presents with   Consult   consult      Ann Stone is an 55 y.o. female.  HPI: Patient presents complaining of a left ear mass.  She believes its been present for around a year.  It is suspected that it is a keloid and it was injected with steroids but that did not help.  It is tender and painful and she is bothered by it and she wanted to be removed.  She does believe is getting bigger.  Allergies  Allergen Reactions   Influenza Vaccines     She had an allergic reaction in the past - arm became extremely swollen at the site of injection.    Metoprolol Tartrate     REACTION: Numbness, weakness and tingling   Penicillins     Outpatient Encounter Medications as of 07/04/2021  Medication Sig   ezetimibe (ZETIA) 10 MG tablet Take 1 tablet (10 mg total) by mouth daily.   FLUoxetine (PROZAC) 20 MG tablet Take 2 tablets (40 mg total) by mouth daily.   lisinopril-hydrochlorothiazide (ZESTORETIC) 20-12.5 MG tablet Take 1 tablet by mouth once daily   metFORMIN (GLUCOPHAGE) 500 MG tablet Take 1 tablet (500 mg total) by mouth 2 (two) times daily with a meal.   rosuvastatin (CRESTOR) 40 MG tablet Take 1 tablet (40 mg total) by mouth daily.   Semaglutide 3 MG TABS Take 3 mg by mouth daily with breakfast.   No facility-administered encounter medications on file as of 07/04/2021.     Past Medical History:  Diagnosis Date   Breast pain 09/08/2017   Depression    GERD (gastroesophageal reflux disease)    HTN (hypertension)    Hyperlipemia    Leiomyoma of uterus    s/p hysterectomy, hx of uterine hemorrhage   Tobacco abuse     Past Surgical History:  Procedure Laterality Date   ABDOMINAL HYSTERECTOMY     OPERATIVE HYSTEROSCOPY      Family History  Problem Relation Age of Onset   Cancer Mother    Osteoarthritis Mother    Cancer Father     Social  History   Social History Narrative   Not on file     Review of Systems General: Denies fevers, chills, weight loss CV: Denies chest pain, shortness of breath, palpitations  Physical Exam Vitals with BMI 07/04/2021 06/26/2021 06/26/2021  Height 5\' 5"  - 5\' 5"   Weight 232 lbs 10 oz - 233 lbs 6 oz  BMI 56.31 - 49.70  Systolic 263 785 885  Diastolic 82 71 81  Pulse 89 94 99    General:  No acute distress,  Alert and oriented, Non-Toxic, Normal speech and affect Examination shows a 3 cm diameter firm mass in the left superior aspect of the helical rim.  It feels and looks like a keloid but somewhat difficult to say with certainty.  Assessment/Plan Patient presents with a left ear mass.  I recommended excision.  I explained it would change the shape of her ear a little bit but would certainly be less obvious after the excision.  We discussed risks include bleeding, infection, damage to surrounding structures.  We discussed that if it is a keloid it has the potential to recur and how we would manage that.  All of her questions were answered we will plan to get this scheduled.  [name]  [place] Lay 07/04/2021, 11:09 AM

## 2021-07-30 ENCOUNTER — Other Ambulatory Visit: Payer: Self-pay | Admitting: Internal Medicine

## 2021-07-30 DIAGNOSIS — E1169 Type 2 diabetes mellitus with other specified complication: Secondary | ICD-10-CM

## 2021-07-31 ENCOUNTER — Ambulatory Visit (INDEPENDENT_AMBULATORY_CARE_PROVIDER_SITE_OTHER): Payer: Self-pay | Admitting: Plastic Surgery

## 2021-07-31 ENCOUNTER — Encounter: Payer: Self-pay | Admitting: Plastic Surgery

## 2021-07-31 ENCOUNTER — Other Ambulatory Visit: Payer: Self-pay

## 2021-07-31 ENCOUNTER — Other Ambulatory Visit (HOSPITAL_COMMUNITY)
Admission: RE | Admit: 2021-07-31 | Discharge: 2021-07-31 | Disposition: A | Discharge status: Home / Self Care | Payer: Medicaid Other | Source: Ambulatory Visit | Attending: Plastic Surgery | Admitting: Plastic Surgery

## 2021-07-31 VITALS — BP 124/75 | HR 95

## 2021-07-31 DIAGNOSIS — L910 Hypertrophic scar: Secondary | ICD-10-CM | POA: Diagnosis not present

## 2021-07-31 NOTE — Progress Notes (Signed)
Operative Note   DATE OF OPERATION: 07/31/2021  LOCATION:    SURGICAL DEPARTMENT: Plastic Surgery  PREOPERATIVE DIAGNOSES: Left ear mass  POSTOPERATIVE DIAGNOSES:  same  PROCEDURE:  Excision of left ear mass measuring 5 cm Complex closure measuring 5 cm  SURGEON: Talmadge Coventry, MD  ANESTHESIA:  Local  COMPLICATIONS: None.   INDICATIONS FOR PROCEDURE:  The patient, Ann Stone is a 56 y.o. female born on 08-Aug-1965, is here for treatment of left ear mass MRN: [id number]  CONSENT:  Informed consent was obtained directly from the patient. Risks, benefits and alternatives were fully discussed. Specific risks including but not limited to bleeding, infection, hematoma, seroma, scarring, pain, infection, wound healing problems, and need for further surgery were all discussed. The patient did have an ample opportunity to have questions answered to satisfaction.   DESCRIPTION OF PROCEDURE:  Local anesthesia was administered. The patient's operative site was prepped and draped in a sterile fashion. A time out was performed and all information was confirmed to be correct.  The lesion was excised with a 15 blade.  Hemostasis was obtained.  Circumferential undermining was performed and the skin was advanced and closed in layers with interrupted buried Monocryl sutures and 5-0 Monocryl for the skin.  The lesion excised measured 5 cm, and the total length of closure measured 5 cm.    The patient tolerated the procedure well.  There were no complications.

## 2021-08-02 LAB — SURGICAL PATHOLOGY

## 2021-08-14 ENCOUNTER — Other Ambulatory Visit: Payer: Self-pay

## 2021-08-14 ENCOUNTER — Ambulatory Visit (INDEPENDENT_AMBULATORY_CARE_PROVIDER_SITE_OTHER): Payer: 59 | Admitting: Plastic Surgery

## 2021-08-14 DIAGNOSIS — L910 Hypertrophic scar: Secondary | ICD-10-CM | POA: Diagnosis not present

## 2021-08-14 NOTE — Progress Notes (Signed)
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  She feels like things are going well.  Pathology showed hypertrophic scar.  On exam everything looks to be holding together well.  She has much improved contour and shape to her ear overall.  There is a little bit of scabbing on either side of the incision.  Suture knots were removed.  I suggested Vaseline to help the scabbing lift off and to keep the skin from being too dry.  We will plan to see her again in 3 weeks if necessary.  All of her questions were answered.

## 2021-08-19 ENCOUNTER — Telehealth: Payer: Self-pay | Admitting: Internal Medicine

## 2021-08-19 NOTE — Telephone Encounter (Signed)
Refill Request-  Pt states her insurance will pay for the capsules but not the tablets for the following medication:   Pt would like a nurse to call her back about the dosage for her Rybelsus.  Pt states she has not had any problems and wants to know if it will be increased and she is currently out of it.  Pt wants to know if an appt is required   Pt also states her ins is still waiting on an approval for the her  RYBELSUS 3 MG TABS.    FLUoxetine (PROZAC) 20 MG tablet  [facility] ([place]), [place] - [address] (Ph: [phone])

## 2021-08-20 ENCOUNTER — Other Ambulatory Visit: Payer: Self-pay | Admitting: Student

## 2021-08-20 MED ORDER — RYBELSUS 7 MG PO TABS: 7.0000 mg | ORAL_TABLET | Freq: Every day | ORAL | 1 refills | Status: DC

## 2021-08-20 MED ORDER — FLUOXETINE HCL 40 MG PO CAPS: 40.0000 mg | ORAL_CAPSULE | Freq: Every day | ORAL | 2 refills | Status: DC

## 2021-08-20 NOTE — Progress Notes (Signed)
Received message from patient stating that her insurance will cover the Prozac capsules but not the tablets.  Discontinued Prozac tabs 20 mg BID and prescribe Prozac capsules 40 mg daily to decrease her pill burden. Also increase patient's Rybelsus from 3 to 7 mg daily as patient has some weight loss from this without any side effects.  She reports she has gone 1.5 weeks without this medication. Patient advised to follow-up with PCP in 1 month for reassessment.

## 2021-08-20 NOTE — Telephone Encounter (Signed)
Request has been addressed.  Medications have been sent to her pharmacy.

## 2021-08-21 ENCOUNTER — Other Ambulatory Visit: Payer: Self-pay | Admitting: Internal Medicine

## 2021-08-21 DIAGNOSIS — I10 Essential (primary) hypertension: Secondary | ICD-10-CM

## 2021-08-26 ENCOUNTER — Telehealth: Payer: Self-pay

## 2021-08-26 NOTE — Telephone Encounter (Signed)
Pa for pt ( RYBELSUS) came through on cover my meds was submitted with office notes and labs .. awaiting approval or denial

## 2021-08-28 NOTE — Telephone Encounter (Signed)
DECISION :     Denied ... but I have ran it again .. awaiting new approval or denial

## 2021-08-28 NOTE — Telephone Encounter (Signed)
DECISION :     DENIED YET AGAIN  for the following reasons    There is no clinical information indicating your patient has met both of the following: A) Individual  will continue maximally tolerated metformin therapy, if not contraindicated per FDA label, intolerant,  or otherwise not a candidate -and- B) Documentation of one of the following: 1. Unable to achieve  goal HbA1C despite 90 consecutive days of metformin or metformin-containing regimen  (meglitinides, sulfonylureas, or thiazolidinediones) at greater than or equal to 1,500 mg per day; 2.  Intolerance to metformin 1,500 mg per day despite appropriate dose titration duration (for example,  period of 8-12 weeks); 3. Contraindication to metformin per FDA label (for example, acute/chronic  metabolic acidosis, severe renal dysfunction); 4. Not a candidate for metformin (for example,  hepatic impairment, moderate renal dysfunction, unstable heart failure). For individuals able to inject (for example using insulin), Rybelsus is considered medically  necessary when the following criteria are met: Documented contraindication per FDA label,  intolerance, or inability to use all of the following: (A) Bydureon, Bydureon BCise, OR Byetta (B)  Trulicity. Rybelsus is medically necessary when all the following are met: 1. Individual is 54 years of age or  older. 2. Type 2 diabetes mellitus 3. Metformin Requirement Criteria* 4. For individuals able to  inject medication (for example using insulin) - documented contraindication per FDA label,  intolerance, or inability to use Trulicity and Bydureon/Bydureon BCise/Byetta 5. For individuals not  able to inject medication, both of the following: A. inadequately controlled on 2 concurrent      ( COPY ALSO PLACED TO BE SCANNED TO CHART )

## 2021-08-28 NOTE — Telephone Encounter (Signed)
Patient is on maximum tolerated metformin dose.

## 2021-08-30 NOTE — Telephone Encounter (Signed)
Another pa came through this one from pt insurance  ( PAPER ) was done  and faxed back to [phone]) awaiting for approval or denial

## 2021-09-04 ENCOUNTER — Ambulatory Visit: Payer: 59 | Admitting: Plastic Surgery

## 2021-09-09 ENCOUNTER — Ambulatory Visit (INDEPENDENT_AMBULATORY_CARE_PROVIDER_SITE_OTHER): Payer: Managed Care, Other (non HMO) | Admitting: Internal Medicine

## 2021-09-09 ENCOUNTER — Encounter: Payer: Self-pay | Admitting: Internal Medicine

## 2021-09-09 DIAGNOSIS — E669 Obesity, unspecified: Secondary | ICD-10-CM | POA: Diagnosis not present

## 2021-09-09 DIAGNOSIS — E1169 Type 2 diabetes mellitus with other specified complication: Secondary | ICD-10-CM | POA: Diagnosis not present

## 2021-09-09 DIAGNOSIS — E119 Type 2 diabetes mellitus without complications: Secondary | ICD-10-CM

## 2021-09-09 MED ORDER — METFORMIN HCL 500 MG PO TABS: 1500.0000 mg | ORAL_TABLET | Freq: Every day | ORAL | 0 refills | Status: DC

## 2021-09-09 MED ORDER — RYBELSUS 3 MG PO TABS: 3.0000 mg | ORAL_TABLET | Freq: Every day | ORAL | 0 refills | Status: AC

## 2021-09-09 NOTE — Assessment & Plan Note (Addendum)
Presents because prior authorization for semaglutide was again denied.  Patient has history of type 2 diabetes with most recent hemoglobin A1c from November 2022 8.5 percent.  Patient is currently on metformin 1000 mg daily.  She had some nausea vomiting when increasing the dose of metformin but now tolerates it well.  Endorses polydipsia and polyuria.  Denies symptoms of peripheral neuropathy.  Has not established with ophthalmologist and requests referral.  She rarely checks her blood sugars and when she does they are fasting sugars which range from 100-200.  She reports she has never had a provider increase the dose beyond 1000 mg daily.  She was started on semaglutide 3 mg daily at last OV for T2DM and to assist with weight loss.  Patient was able to take the medication for 1 month after which her insurance changed and she is now with [place] health.  [employer] has denied her prior authorization for semaglutide.  This documentation from [place] was reviewed and per documentation, the patient needs to be on 1500 mg of metformin for 90 days and still requiring additional medication for diabetes management prior to authorization.  Patient also not able to use injection medication which was confirmed today with patient.  Patient's diabetes currently uncontrolled on metformin 1000 mg daily.  We will increase metformin to 1500 mg daily and monitor for side effects.  If patient has side effects, which I suspect she may with 1500 mg, this needs to be documented.  Plan: -Increase metformin to 1500 mg daily -Patient instructed to call to inform us if she starts having nausea/vomiting/diarrhea at which time we will go back to 1000 mg dosing and document side effects to 1500 mg daily dosing. -If patient does not tolerate 1500 mg metformin, patient would be candidate for addition of Rybelsus given failure of 1500 mg metformin dosing and unable to take injectable medication. -Patient will follow-up in 1 week  or sooner if needed, will need hemoglobin A1c and foot exam at follow-up. -If prior authorization for Rybelsus is approved, she would need to restart on 3 mg daily, 7 mg Rybelsus was discontinued today. -Referral to ophthalmology for diabetic eye exam placed  ADDENDUM: Patient had intolerable nausea and diarrhea on increased Metformin dose of 1500mg  daily. Patient has gone back to her usual Metformin dose with improvement in symptoms. 1000mg  per day of Metformin is her maximum tolerable dose and she should continue on this dose.

## 2021-09-09 NOTE — Progress Notes (Signed)
°  CC: Prior authorization for Semaglutide still denied  HPI:  [name] is a 56 y.o. female with a past medical history stated below and presents today for medication management. Please see problem based assessment and plan for additional details.  Past Medical History:  Diagnosis Date   Breast pain 09/08/2017   Depression    GERD (gastroesophageal reflux disease)    HTN (hypertension)    Hyperlipemia    Leiomyoma of uterus    s/p hysterectomy, hx of uterine hemorrhage   Tobacco abuse     Current Outpatient Medications on File Prior to Visit  Medication Sig Dispense Refill   ezetimibe (ZETIA) 10 MG tablet Take 1 tablet (10 mg total) by mouth daily. 90 tablet 3   FLUoxetine (PROZAC) 40 MG capsule Take 1 capsule (40 mg total) by mouth daily. 60 capsule 2   lisinopril-hydrochlorothiazide (ZESTORETIC) 20-12.5 MG tablet Take 1 tablet by mouth once daily 30 tablet 0   rosuvastatin (CRESTOR) 40 MG tablet Take 1 tablet (40 mg total) by mouth daily. 30 tablet 5   No current facility-administered medications on file prior to visit.    Family History  Problem Relation Age of Onset   Cancer Mother    Osteoarthritis Mother    Cancer Father    Review of Systems: ROS negative except for what is noted on the assessment and plan.  Vitals:   09/09/21 1557  BP: 126/79  Pulse: 93  Temp: 99.1 F (37.3 C)  TempSrc: Oral  SpO2: 98%  Weight: 218 lb 1.6 oz (98.9 kg)  Height: 5\' 5"  (1.651 m)     Physical Exam: General: Well appearing african [place] female, NAD HENT: normocephalic, atraumatic EYES: conjunctiva non-erythematous, no scleral icterus CV: regular rate, normal rhythm, no murmurs, rubs, gallops. Pulmonary: normal work of breathing on RA, lungs clear to auscultation, no rales, wheezes, rhonchi Abdominal: non-distended, soft, non-tender to palpation, normal BS Skin: Warm and dry, no rashes or lesions on exposed surfaces Neurological: MS: awake, alert and oriented x3,  normal speech and fund of knowledge Motor: moves all extremities antigravity Psych: normal affect    Assessment & Plan:   See Encounters Tab for problem based charting.  Patient discussed with Dr. Jilda Panda, M.D. [facility], PGY-1 Pager: [phone] Date 09/09/2021 Time 5:10 PM

## 2021-09-09 NOTE — Patient Instructions (Signed)
Thank you, Ms.Georgina Quint Obar for allowing [place] to provide your care today. Today we discussed rybelsus.  I ordered metformin 1500mg  once daily which insurance needs [place] to try before we get approval for rybelsus. Please call us if you are having nausea/vomting or diarrhea and we will go back to 1,000mg  dose and will document so that the insurance company can see that it was not tolerated.     My Chart Access: [email or web address]  Please follow-up in in 1 weeks or sooner if you are feeling side effects of Metformin. At next appointment will we do a foot exam and repeat a Hgb A1c test.  Please make sure to arrive 15 minutes prior to your next appointment. If you arrive late, you may be asked to reschedule.    We look forward to seeing you next time. Please call our clinic at [phone] if you have any questions or concerns. The best time to call is Monday-Friday from 9am-4pm, but there is someone available 24/7. If after hours or the weekend, call the main hospital number and ask for the Internal Medicine Resident On-Call. If you need medication refills, please notify your pharmacy one week in advance and they will send [place] a request.   Thank you for letting us take part in your care. Wishing you the best!  Wayland Denis, MD 09/09/2021, 5:06 PM IM Resident, PGY-1

## 2021-09-10 ENCOUNTER — Telehealth: Payer: Self-pay | Admitting: *Deleted

## 2021-09-10 NOTE — Telephone Encounter (Signed)
Call from pt who stated Metformin dosage was increased at her OV yesterday 09/09/21. Metformin was increased from 1000 mg daily to 1500 mg daily. She took 1500 mg early this am and stated she has been throwing up and having diarrhea since 10AM. And was told by the doctor to call the office if she develops any problems.

## 2021-09-10 NOTE — Progress Notes (Signed)
Internal Medicine Clinic Attending ? ?Case discussed with Dr. Zinoviev  At the time of the visit.  We reviewed the resident?s history and exam and pertinent patient test results.  I agree with the assessment, diagnosis, and plan of care documented in the resident?s note.  ?

## 2021-09-11 ENCOUNTER — Telehealth: Payer: Self-pay | Admitting: Internal Medicine

## 2021-09-11 ENCOUNTER — Telehealth: Payer: Self-pay

## 2021-09-11 MED ORDER — METFORMIN HCL 500 MG PO TABS: 1000.0000 mg | ORAL_TABLET | Freq: Every day | ORAL | 0 refills | Status: DC

## 2021-09-11 NOTE — Telephone Encounter (Signed)
PA  for pt [facility] ) was done again with office notes from 2/27 .. submitted through cover my meds  awaiting approval or denial  ?

## 2021-09-11 NOTE — Telephone Encounter (Signed)
09/11/21 3:12 PM ? ?Informed by [facility] staff that patient had nausea and diarrhea following first dose of 1500mg  Metformin yesterday. Patient was contact by myself today, identity was verified. Patient verifies symptoms of nausea and diarrhea yesterday following increased dose, reports these symptoms have improved today after returning to her usual dose of 1,000mg  daily. Patient was instructed to continue on 1,000mg  Metformin daily. ? ?Wayland Denis, MD ?09/11/21,  3:12 PM ?Pager: [phone] ?Internal Medicine Resident, PGY-1 ?[facility]  ?  ? ?

## 2021-09-11 NOTE — Addendum Note (Signed)
Addended by: Wayland Denis on: 09/11/2021 03:07 PM   Modules accepted: Orders

## 2021-09-11 NOTE — Telephone Encounter (Signed)
UPDATE : ? ? ? ?[employer] is reviewing your PA request and will respond within 24 hours for Medicaid or up to 72 hours for non-Medicaid plans, based on the required timeframe determined by state or federal regulations ?

## 2021-09-12 NOTE — Telephone Encounter (Signed)
See Dr Joeseph Amor telephone encounter. ?

## 2021-09-13 NOTE — Telephone Encounter (Signed)
UPDATE : ? ? ? ? Pa form from insurance  company came through [employer] for  more info .. was done and faxed back to [phone] ?

## 2021-09-24 ENCOUNTER — Encounter: Payer: Self-pay | Admitting: Physician Assistant

## 2021-09-24 ENCOUNTER — Telehealth: Payer: Managed Care, Other (non HMO) | Admitting: Family Medicine

## 2021-09-24 ENCOUNTER — Other Ambulatory Visit: Payer: Self-pay | Admitting: Physician Assistant

## 2021-09-24 DIAGNOSIS — M109 Gout, unspecified: Secondary | ICD-10-CM

## 2021-09-24 MED ORDER — COLCHICINE 0.6 MG PO TABS: ORAL_TABLET | ORAL | 0 refills | Status: DC

## 2021-09-24 MED ORDER — PREDNISONE 20 MG PO TABS: 40.0000 mg | ORAL_TABLET | Freq: Every day | ORAL | 0 refills | Status: AC

## 2021-09-24 MED ORDER — IBUPROFEN 600 MG PO TABS
600.0000 mg | ORAL_TABLET | Freq: Three times a day (TID) | ORAL | 0 refills | Status: DC | PRN
Start: 2021-09-24 — End: 2022-02-26

## 2021-09-24 NOTE — Progress Notes (Signed)
Received message from pharmacy that colchicine interacts with diltizem she is on which was not part of her medication list when provider sent in gout medication. This was canceled. Ordering provider notified. Verified prednisone was received. Message sent to patient.  ?

## 2021-09-24 NOTE — Patient Instructions (Signed)
Gout ?Gout is painful swelling of your joints. Gout is a type of arthritis. It is caused by having too much uric acid in your body. Uric acid is a chemical that is made when your body breaks down substances called purines. If your body has too much uric acid, sharp crystals can form and build up in your joints. This causes pain and swelling. ?Gout attacks can happen quickly and be very painful (acute gout). Over time, the attacks can affect more joints and happen more often (chronic gout). ?What are the causes? ?Too much uric acid in your blood. This can happen because: ?Your kidneys do not remove enough uric acid from your blood. ?Your body makes too much uric acid. ?You eat too many foods that are high in purines. These foods include organ meats, some seafood, and beer. ?Trauma or stress. ?What increases the risk? ?Having a family history of gout. ?Being female and middle-aged. ?Being female and having gone through menopause. ?Being very overweight (obese). ?Drinking alcohol, especially beer. ?Not having enough water in the body (being dehydrated). ?Losing weight too quickly. ?Having an organ transplant. ?Having lead poisoning. ?Taking certain medicines. ?Having kidney disease. ?Having a skin condition called psoriasis. ?What are the signs or symptoms? ?An attack of acute gout usually happens in just one joint. The most common place is the big toe. Attacks often start at night. Other joints that may be affected include joints of the feet, ankle, knee, fingers, wrist, or elbow. Symptoms of an attack may include: ?Very bad pain. ?Warmth. ?Swelling. ?Stiffness. ?Shiny, red, or purple skin. ?Tenderness. The affected joint may be very painful to touch. ?Chills and fever. ?Chronic gout may cause symptoms more often. More joints may be involved. You may also have white or yellow lumps (tophi) on your hands or feet or in other areas near your joints. ?How is this treated? ?Treatment for this condition has two phases:  treating an acute attack and preventing future attacks. ?Acute gout treatment may include: ?NSAIDs. ?Steroids. These are taken by mouth or injected into a joint. ?Colchicine. This medicine relieves pain and swelling. It can be given by mouth or through an IV tube. ?Preventive treatment may include: ?Taking small doses of NSAIDs or colchicine daily. ?Using a medicine that reduces uric acid levels in your blood. ?Making changes to your diet. You may need to see a food expert (dietitian) about what to eat and drink to prevent gout. ?Follow these instructions at home: ?During a gout attack ? ?If told, put ice on the painful area: ?Put ice in a plastic bag. ?Place a towel between your skin and the bag. ?Leave the ice on for 20 minutes, 2-3 times a day. ?Raise (elevate) the painful joint above the level of your heart as often as you can. ?Rest the joint as much as possible. If the joint is in your leg, you may be given crutches. ?Follow instructions from your doctor about what you cannot eat or drink. ?Avoiding future gout attacks ?Eat a low-purine diet. Avoid foods and drinks such as: ?Liver. ?Kidney. ?Anchovies. ?Asparagus. ?Herring. ?Mushrooms. ?Mussels. ?Beer. ?Stay at a healthy weight. If you want to lose weight, talk with your doctor. Do not lose weight too fast. ?Start or continue an exercise plan as told by your doctor. ?Eating and drinking ?Drink enough fluids to keep your pee (urine) pale yellow. ?If you drink alcohol: ?Limit how much you use to: ?0-1 drink a day for women. ?0-2 drinks a day for men. ?Be aware of   how much alcohol is in your drink. In the [place] one drink equals one 12 oz bottle of beer (355 mL), one 5 oz glass of wine (148 mL), or one 1? oz glass of hard liquor (44 mL). ?General instructions ?Take over-the-counter and prescription medicines only as told by your doctor. ?Do not drive or use heavy machinery while taking prescription pain medicine. ?Return to your normal activities as told by your  doctor. Ask your doctor what activities are safe for you. ?Keep all follow-up visits as told by your doctor. This is important. ?Contact a doctor if: ?You have another gout attack. ?You still have symptoms of a gout attack after 10 days of treatment. ?You have problems (side effects) because of your medicines. ?You have chills or a fever. ?You have burning pain when you pee (urinate). ?You have pain in your lower back or belly. ?Get help right away if: ?You have very bad pain. ?Your pain cannot be controlled. ?You cannot pee. ?Summary ?Gout is painful swelling of the joints. ?The most common site of pain is the big toe, but it can affect other joints. ?Medicines and avoiding some foods can help to prevent and treat gout attacks. ?This information is not intended to replace advice given to you by your health care provider. Make sure you discuss any questions you have with your health care provider. ?Document Revised: 01/08/2018 Document Reviewed: 01/20/2018 ?Elsevier Patient Education ? 2022 [employer] ? ?

## 2021-09-24 NOTE — Progress Notes (Signed)
?Virtual Visit Consent  ? ?[name], you are scheduled for a virtual visit with a [facility] provider today.   ?  ?Just as with appointments in the office, your consent must be obtained to participate.  Your consent will be active for this visit and any virtual visit you may have with one of our providers in the next 365 days.   ?  ?If you have a MyChart account, a copy of this consent can be sent to you electronically.  All virtual visits are billed to your insurance company just like a traditional visit in the office.   ? ?As this is a virtual visit, video technology does not allow for your provider to perform a traditional examination.  This may limit your provider's ability to fully assess your condition.  If your provider identifies any concerns that need to be evaluated in person or the need to arrange testing (such as labs, EKG, etc.), we will make arrangements to do so.   ?  ?Although advances in technology are sophisticated, we cannot ensure that it will always work on either your end or our end.  If the connection with a video visit is poor, the visit may have to be switched to a telephone visit.  With either a video or telephone visit, we are not always able to ensure that we have a secure connection.    ? ?I need to obtain your verbal consent now.   Are you willing to proceed with your visit today?  ?  ?Ann Stone has provided verbal consent on 09/24/2021 for a virtual visit (video or telephone). ?  ?Perlie Mayo, NP  ? ?Date: 09/24/2021 8:38 AM ? ? ?Virtual Visit via Video Note  ? ?Ann Stone, connected with  Ann Stone  ([id number], [birth year]) on 09/24/21 at  8:30 AM EDT by a video-enabled telemedicine application and verified that I am speaking with the correct person using two identifiers. ? ?Location: ?Patient: Virtual Visit Location Patient: Home ?Provider: Virtual Visit Location Provider: Home Office ?  ?I discussed the limitations of evaluation and management by telemedicine  and the availability of in person appointments. The patient expressed understanding and agreed to proceed.   ? ?History of Present Illness: ?Ann Stone is a 56 y.o. who identifies as a female who was assigned female at birth, and is being seen today for gout.Reports only having this happen on other time. But the pain and presentation are the same. No on daily meds. She is unsure what caused it.  ? ?HPI: Toe Pain  ?The incident occurred 12 to 24 hours ago. Injury mechanism: no injury- gout. The pain is present in the right toes. The pain is at a severity of 9/10. The pain is severe. The pain has been Worsening since onset. The symptoms are aggravated by weight bearing and movement. She has tried rest for the symptoms. The treatment provided no relief.   ?Problems:  ?Patient Active Problem List  ? Diagnosis Date Noted  ? Hair loss 05/30/2021  ? Diabetes ([place]) 05/29/2021  ? Vaginal mass 04/25/2021  ? Ear pain, left 03/27/2021  ? Tachycardia 10/16/2020  ? Sebaceous cyst 10/08/2020  ? Preventative health care 10/06/2016  ? Depression 07/22/2010  ? Hyperlipidemia 08/02/2008  ? 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 Hypertension, essential 06/29/2006  ?  ?Allergies:  ?Allergies  ?Allergen Reactions  ? Influenza Vaccines   ?  She had an allergic reaction in  the past - arm became extremely swollen at the site of injection. ?  ? Metoprolol Tartrate   ?  REACTION: Numbness, weakness and tingling  ? Penicillins   ? ?Medications:  ?Current Outpatient Medications:  ?  colchicine 0.6 MG tablet, Take 2 tablets immediately, then 1 tablet twice a day for the duration of the flare up to a max of 7 days. (Discontinue for stomach pains or diarrhea)., Disp: 14 tablet, Rfl: 0 ?  ibuprofen (ADVIL) 600 MG tablet, Take 1 tablet (600 mg total) by mouth every 8 (eight) hours as needed., Disp: 30 tablet, Rfl: 0 ?  predniSONE (DELTASONE) 20 MG tablet, Take 2 tablets (40 mg total) by mouth daily with breakfast  for 7 days., Disp: 14 tablet, Rfl: 0 ?  ezetimibe (ZETIA) 10 MG tablet, Take 1 tablet (10 mg total) by mouth daily., Disp: 90 tablet, Rfl: 3 ?  FLUoxetine (PROZAC) 40 MG capsule, Take 1 capsule (40 mg total) by mouth daily., Disp: 60 capsule, Rfl: 2 ?  lisinopril-hydrochlorothiazide (ZESTORETIC) 20-12.5 MG tablet, Take 1 tablet by mouth once daily, Disp: 30 tablet, Rfl: 0 ?  metFORMIN (GLUCOPHAGE) 500 MG tablet, Take 2 tablets (1,000 mg total) by mouth daily with breakfast., Disp: 60 tablet, Rfl: 0 ?  rosuvastatin (CRESTOR) 40 MG tablet, Take 1 tablet (40 mg total) by mouth daily., Disp: 30 tablet, Rfl: 5 ?  Semaglutide (RYBELSUS) 3 MG TABS, Take 3 mg by mouth daily., Disp: 30 tablet, Rfl: 0 ? ?Observations/Objective: ?Patient is well-developed, well-nourished in no acute distress.  ?Resting comfortably  at home.  ?Head is normocephalic, atraumatic.  ?No labored breathing.  ?Speech is clear and coherent with logical content.  ?Patient is alert and oriented at baseline.  ? ? ?Assessment and Plan: ?1. Acute gout involving toe of right foot, unspecified cause ?- ibuprofen (ADVIL) 600 MG tablet; Take 1 tablet (600 mg total) by mouth every 8 (eight) hours as needed.  Dispense: 30 tablet; Refill: 0 ?- colchicine 0.6 MG tablet; Take 2 tablets immediately, then 1 tablet twice a day for the duration of the flare up to a max of 7 days. (Discontinue for stomach pains or diarrhea).  Dispense: 14 tablet; Refill: 0 ?- predniSONE (DELTASONE) 20 MG tablet; Take 2 tablets (40 mg total) by mouth daily with breakfast for 7 days.  Dispense: 14 tablet; Refill: 0 ? ?S&S consistent with gout flare- not on daily meds; she is unsure what caused- advised follow up with PCP to help in future or if these fail to help her. ?Gout info on AVS  ? ?Follow Up Instructions: ?I discussed the assessment and treatment plan with the patient. The patient was provided an opportunity to ask questions and all were answered. The patient agreed with the plan  and demonstrated an understanding of the instructions.  A copy of instructions were sent to the patient via MyChart unless otherwise noted below.  ? ? ? ?The patient was advised to call back or seek an in-person evaluation if the symptoms worsen or if the condition fails to improve as anticipated. ? ?Time:  ?I spent 10 minutes with the patient via telehealth technology discussing the above problems/concerns.   ? ?Perlie Mayo, NP ? ? ?

## 2021-09-30 ENCOUNTER — Other Ambulatory Visit: Payer: Self-pay | Admitting: Internal Medicine

## 2021-09-30 DIAGNOSIS — I10 Essential (primary) hypertension: Secondary | ICD-10-CM

## 2021-10-03 NOTE — Telephone Encounter (Signed)
Pt called / informed of refill. 

## 2021-10-03 NOTE — Telephone Encounter (Signed)
Call from pt about refill on her BP pill, Lisinopril-hctz. Request was sent to PCP 09/30/21 .I will send request to the Attending. ?Thanks ?

## 2021-11-12 ENCOUNTER — Other Ambulatory Visit: Payer: Self-pay | Admitting: Internal Medicine

## 2021-11-12 DIAGNOSIS — I10 Essential (primary) hypertension: Secondary | ICD-10-CM

## 2021-12-22 ENCOUNTER — Other Ambulatory Visit: Payer: Self-pay | Admitting: Internal Medicine

## 2021-12-22 ENCOUNTER — Telehealth: Payer: Commercial Managed Care - HMO | Admitting: Family

## 2021-12-22 DIAGNOSIS — B9689 Other specified bacterial agents as the cause of diseases classified elsewhere: Secondary | ICD-10-CM

## 2021-12-22 DIAGNOSIS — N760 Acute vaginitis: Secondary | ICD-10-CM

## 2021-12-22 DIAGNOSIS — I10 Essential (primary) hypertension: Secondary | ICD-10-CM

## 2021-12-22 MED ORDER — METRONIDAZOLE 500 MG PO TABS
500.0000 mg | ORAL_TABLET | Freq: Two times a day (BID) | ORAL | 0 refills | Status: DC
Start: 2021-12-22 — End: 2022-02-26

## 2021-12-22 NOTE — Progress Notes (Signed)
Virtual Visit Consent   [name], you are scheduled for a virtual visit with a [facility] provider today. Just as with appointments in the office, your consent must be obtained to participate. Your consent will be active for this visit and any virtual visit you may have with one of our providers in the next 365 days. If you have a MyChart account, a copy of this consent can be sent to you electronically.  As this is a virtual visit, video technology does not allow for your provider to perform a traditional examination. This may limit your provider's ability to fully assess your condition. If your provider identifies any concerns that need to be evaluated in person or the need to arrange testing (such as labs, EKG, etc.), we will make arrangements to do so. Although advances in technology are sophisticated, we cannot ensure that it will always work on either your end or our end. If the connection with a video visit is poor, the visit may have to be switched to a telephone visit. With either a video or telephone visit, we are not always able to ensure that we have a secure connection.  By engaging in this virtual visit, you consent to the provision of healthcare and authorize for your insurance to be billed (if applicable) for the services provided during this visit. Depending on your insurance coverage, you may receive a charge related to this service.  I need to obtain your verbal consent now. Are you willing to proceed with your visit today? ACELYN BASHAM has provided verbal consent on 12/22/2021 for a virtual visit (video or telephone). Evelina Dun, FNP  Date: 12/22/2021 4:22 PM  Virtual Visit via Video Note   I, Evelina Dun, connected with  Ann Stone  ([id number], Sep 24, 1965) on 12/22/21 at  4:30 PM EDT by a video-enabled telemedicine application and verified that I am speaking with the correct person using two identifiers.  Location: Patient: Virtual Visit Location Patient:  Home Provider: Virtual Visit Location Provider: Home Office   I discussed the limitations of evaluation and management by telemedicine and the availability of in person appointments. The patient expressed understanding and agreed to proceed.    History of Present Illness: Ann Stone is a 56 y.o. who identifies as a female who was assigned female at birth, and is being seen today for vaginal discharge that started 5 days ago.  HPI: Vaginal Discharge The patient's primary symptoms include genital itching, a genital odor and vaginal discharge. This is a new problem. The current episode started in the past 7 days. The problem occurs intermittently. Pertinent negatives include no chills, diarrhea, dysuria, hematuria or urgency. The vaginal discharge was white. The treatment provided mild relief.    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    Metoprolol Tartrate     REACTION: Numbness, weakness and tingling   Penicillins    Medications:  Current Outpatient  Medications:    metroNIDAZOLE (FLAGYL) 500 MG tablet, Take 1 tablet (500 mg total) by mouth 2 (two) times daily., Disp: 14 tablet, Rfl: 0   diltiazem (CARDIZEM CD) 120 MG 24 hr capsule, Take 120 mg by mouth daily., Disp: , Rfl:    ezetimibe (ZETIA) 10 MG tablet, Take 1 tablet (10 mg total) by mouth daily., Disp: 90 tablet, Rfl: 3   FLUoxetine (PROZAC) 40 MG capsule, Take 1 capsule (40 mg total)  by mouth daily., Disp: 60 capsule, Rfl: 2   ibuprofen (ADVIL) 600 MG tablet, Take 1 tablet (600 mg total) by mouth every 8 (eight) hours as needed., Disp: 30 tablet, Rfl: 0   lisinopril-hydrochlorothiazide (ZESTORETIC) 20-12.5 MG tablet, Take 1 tablet by mouth once daily, Disp: 30 tablet, Rfl: 0   metFORMIN (GLUCOPHAGE) 500 MG tablet, Take 2 tablets (1,000 mg total) by mouth daily with breakfast., Disp: 60 tablet, Rfl: 0   rosuvastatin (CRESTOR) 40 MG tablet, Take 1 tablet (40 mg total) by mouth daily., Disp: 30 tablet, Rfl: 5  Observations/Objective: Patient is well-developed, well-nourished in no acute distress.  Resting comfortably at home.  Head is normocephalic, atraumatic.  No labored breathing. Speech is clear and coherent with logical content.  Patient is alert and oriented at baseline.    Assessment and Plan: 1. BV (bacterial vaginosis) - metroNIDAZOLE (FLAGYL) 500 MG tablet; Take 1 tablet (500 mg total) by mouth 2 (two) times daily.  Dispense: 14 tablet; Refill: 0  Keep clean and dry Probiotic  Safe sex  Follow up if symptoms worsens or do not improve   Follow Up Instructions: I discussed the assessment and treatment plan with the patient. The patient was provided an opportunity to ask questions and all were answered. The patient agreed with the plan and demonstrated an understanding of the instructions.  A copy of instructions were sent to the patient via MyChart unless otherwise noted below.    The patient was advised to call back or seek an in-person evaluation if the symptoms worsen or if the condition fails to improve as anticipated.  Time:  I spent 8 minutes with the patient via telehealth technology discussing the above problems/concerns.    Evelina Dun, FNP

## 2021-12-22 NOTE — Patient Instructions (Signed)

## 2021-12-23 NOTE — Telephone Encounter (Signed)
lisinopril-hydrochlorothiazide (ZESTORETIC) 20-12.5 MG tablet,REFILL REQUEST @ [facility] ([place]), [place] - [address]

## 2021-12-25 NOTE — Telephone Encounter (Signed)
Pt would like to get this medication refill by today. Please call pt back.

## 2022-01-04 ENCOUNTER — Encounter: Payer: Self-pay | Admitting: *Deleted

## 2022-02-05 ENCOUNTER — Encounter: Payer: Commercial Managed Care - HMO | Admitting: Internal Medicine

## 2022-02-25 NOTE — Progress Notes (Unsigned)
[facility]: Clinic Note  Subjective:  History of Present Illness: Ann Stone is a 56 y.o. year old female who presents for routine follow up and to establish care with me as her new PCP. Last visit was 09/09/21 with Dr. Vinetta Bergamo.   Please refer to Assessment and Plan below for full details in Problem-Based Charting.   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 Pertinent updates today include: None  Medications:  Current Outpatient Medications:    FLUoxetine (PROZAC) 40 MG capsule, Take 1 capsule (40 mg total) by mouth daily., Disp: 60 capsule, Rfl: 2   lisinopril-hydrochlorothiazide (ZESTORETIC) 20-12.5 MG tablet, Take 1 tablet by mouth daily., Disp: 90 tablet, Rfl: 1   metFORMIN (GLUCOPHAGE) 500 MG tablet, Take 2 tablets (1,000 mg total) by mouth daily with breakfast., Disp: 60 tablet, Rfl: 0   rosuvastatin (CRESTOR) 40 MG tablet, Take 1 tablet (40 mg total) by mouth daily., Disp: 30 tablet, Rfl: 5   Allergies: Allergies  Allergen Reactions   Influenza Vaccines     She had an allergic reaction in the past - arm became extremely swollen at the site of injection.    Metoprolol Tartrate     REACTION: Numbness, weakness and tingling   Penicillins      Objective:   Vitals: Vitals:   02/26/22 0953 02/26/22 1044  BP: (!) 150/84 (!) 145/84  Pulse: 80 80  Temp: 98.2 F (36.8 C)   SpO2: 99%     Physical Exam: Physical Exam Constitutional:      Appearance: Normal  appearance.  Cardiovascular:     Rate and Rhythm: Normal rate and regular rhythm.     Pulses: Normal pulses.     Heart sounds: No murmur heard. Pulmonary:     Effort: Pulmonary effort is normal.     Breath sounds: Normal breath sounds. No wheezing or rhonchi.  Musculoskeletal:     Right lower leg: No edema.     Left lower leg: No edema.  Skin:    General: Skin is warm and dry.  Neurological:     Mental Status: She is alert.      Data: Labs, imaging, and micro were reviewed in Epic. Refer to Assessment and Plan below for full details in Problem-Based Charting.  Assessment & Plan:  Hyperlipidemia - Continue Crestor '40mg'$  daily - Lipid panel today - If still elevated, will add Zetia  Hypertension, essential - Elevated today on Lisinopril 20/HCTZ 12.'5mg'$  daily  - Check BMP today - If BMP okay, will double Lisinopril/HCTZ, but final plan depends on lab results. Alternatively, we could add Amlodipine  Diabetes ([place]) - A1C today - Continue Metformin '1000mg'$  BID - [employer] insurance denied Semaglutide after multiple attempts.  - If A1C >7 today, will start Jardiance '10mg'$  daily - Eye exam referral placed - Foot exam done today and was normal - Get urine MACR next time   Depression - Patient has longstanding history of depression, and has been on Fluoxetine '40mg'$  daily for years - She's been  having more anxiety lately, and her GAD-7 score was very high at 20 today.  - No SI or thoughts of harming herself - Counseling / talk therapy was not particularly helpful previously  - For today, continue Fluoxetine '40mg'$  daily. Will see patient back in 2 weeks to focus more on her anxiety. At that visit, I think we could increase Fluoxetine to '60mg'$  daily, add something for situational anxiety (ex- hydroxyzine), and/or add a 2nd agent for anxiety specifically.   Preventative health care - MMG ordered today - Address CRC screen at next visit       Exact follow-up plan will depend on lab  results - will call patient with labs once I have those back. I anticipate she'll need to come back sooner than 5 months that we had scheduled.   Lottie Mussel, MD

## 2022-02-26 ENCOUNTER — Encounter: Payer: Self-pay | Admitting: Internal Medicine

## 2022-02-26 ENCOUNTER — Ambulatory Visit (INDEPENDENT_AMBULATORY_CARE_PROVIDER_SITE_OTHER): Payer: Commercial Managed Care - HMO | Admitting: Internal Medicine

## 2022-02-26 VITALS — BP 145/84 | HR 80 | Temp 98.2°F | Ht 65.0 in | Wt 203.7 lb

## 2022-02-26 DIAGNOSIS — I10 Essential (primary) hypertension: Secondary | ICD-10-CM

## 2022-02-26 DIAGNOSIS — E782 Mixed hyperlipidemia: Secondary | ICD-10-CM

## 2022-02-26 DIAGNOSIS — E785 Hyperlipidemia, unspecified: Secondary | ICD-10-CM | POA: Diagnosis not present

## 2022-02-26 DIAGNOSIS — Z87891 Personal history of nicotine dependence: Secondary | ICD-10-CM

## 2022-02-26 DIAGNOSIS — M109 Gout, unspecified: Secondary | ICD-10-CM | POA: Insufficient documentation

## 2022-02-26 DIAGNOSIS — E119 Type 2 diabetes mellitus without complications: Secondary | ICD-10-CM

## 2022-02-26 DIAGNOSIS — F32A Depression, unspecified: Secondary | ICD-10-CM

## 2022-02-26 DIAGNOSIS — Z0000 Encounter for general adult medical examination without abnormal findings: Secondary | ICD-10-CM

## 2022-02-26 DIAGNOSIS — F320 Major depressive disorder, single episode, mild: Secondary | ICD-10-CM

## 2022-02-26 DIAGNOSIS — M1A0790 Idiopathic chronic gout, unspecified ankle and foot, without tophus (tophi): Secondary | ICD-10-CM

## 2022-02-26 DIAGNOSIS — Z1231 Encounter for screening mammogram for malignant neoplasm of breast: Secondary | ICD-10-CM

## 2022-02-26 NOTE — Assessment & Plan Note (Signed)
-   Continue Crestor '40mg'$  daily - Lipid panel today - If still elevated, will add Zetia

## 2022-02-26 NOTE — Assessment & Plan Note (Signed)
-   A1C today - Continue Metformin '1000mg'$  BID - [employer] insurance denied Semaglutide after multiple attempts.  - If A1C >7 today, will start Jardiance '10mg'$  daily - Eye exam referral placed - Foot exam done today and was normal - Get urine MACR next time

## 2022-02-26 NOTE — Assessment & Plan Note (Signed)
-   Patient has longstanding history of depression, and has been on Fluoxetine '40mg'$  daily for years - She's been having more anxiety lately, and her GAD-7 score was very high at 20 today.  - No SI or thoughts of harming herself - Counseling / talk therapy was not particularly helpful previously  - For today, continue Fluoxetine '40mg'$  daily. Will see patient back in 2 weeks to focus more on her anxiety. At that visit, I think we could increase Fluoxetine to '60mg'$  daily, add something for situational anxiety (ex- hydroxyzine), and/or add a 2nd agent for anxiety specifically.

## 2022-02-26 NOTE — Assessment & Plan Note (Signed)
-   MMG ordered today - Address CRC screen at next visit

## 2022-02-26 NOTE — Patient Instructions (Addendum)
Dear Ms Overturf,  It was a pleasure seeing you in clinic today.   For your diabetes, we will recheck your A1C today. If that is high, I'll start you on a 2nd medicine for your diabetes.  For your blood pressure, it was a little bit high today. If your labs look good, I'm going to increase the dose of your blood pressure medicine.  We will also check your cholesterol today.  I'll call you with the results of your labs.  I've ordered a referral for a mammogram and an eye exam - they will call you to schedule this.   We'll schedule you to come back to clinic in about 5 months, and if your labs are abnormal, we'll probably see you sooner. Please call if you need anything else in the meantime.  Sincerely, Dr. Lottie Mussel

## 2022-02-26 NOTE — Assessment & Plan Note (Addendum)
-   Elevated today on Lisinopril 20/HCTZ 12.'5mg'$  daily  - Check BMP today - If BMP okay, will double Lisinopril/HCTZ, but final plan depends on lab results. Alternatively, we could add Amlodipine

## 2022-02-27 LAB — BMP8+ANION GAP
Anion Gap: 16 mmol/L (ref 10.0–18.0)
BUN/Creatinine Ratio: 26 — ABNORMAL HIGH (ref 9–23)
BUN: 16 mg/dL (ref 6–24)
CO2: 30 mmol/L — ABNORMAL HIGH (ref 20–29)
Calcium: 9.9 mg/dL (ref 8.7–10.2)
Chloride: 96 mmol/L (ref 96–106)
Creatinine, Ser: 0.61 mg/dL (ref 0.57–1.00)
Glucose: 99 mg/dL (ref 70–99)
Potassium: 3.7 mmol/L (ref 3.5–5.2)
Sodium: 142 mmol/L (ref 134–144)
eGFR: 106 mL/min/{1.73_m2} (ref >59)

## 2022-02-27 LAB — LIPID PANEL
Chol/HDL Ratio: 3.5 ratio (ref 0.0–4.4)
Cholesterol, Total: 221 mg/dL — ABNORMAL HIGH (ref 100–199)
HDL: 63 mg/dL (ref >39)
LDL Chol Calc (NIH): 144 mg/dL — ABNORMAL HIGH (ref 0–99)
Triglycerides: 78 mg/dL (ref 0–149)
VLDL Cholesterol Cal: 14 mg/dL (ref 5–40)

## 2022-02-27 LAB — HEMOGLOBIN A1C
Est. average glucose Bld gHb Est-mCnc: 117 mg/dL
Hgb A1c MFr Bld: 5.7 % — ABNORMAL HIGH (ref 4.8–5.6)

## 2022-02-27 MED ORDER — EZETIMIBE 10 MG PO TABS: 10.0000 mg | ORAL_TABLET | Freq: Every day | ORAL | 3 refills | Status: DC

## 2022-02-27 MED ORDER — LISINOPRIL-HYDROCHLOROTHIAZIDE 20-12.5 MG PO TABS: 2.0000 | ORAL_TABLET | Freq: Every day | ORAL | 3 refills | Status: DC

## 2022-02-27 NOTE — Addendum Note (Signed)
Addended by: Randalyn Rhea on: 02/27/2022 01:31 PM   Modules accepted: Orders

## 2022-02-27 NOTE — Progress Notes (Signed)
Called patient to review labs - BMP looks stable. Increase Lisinopril 20/HCTZ 12.5 to 40/25 starting today - Lipids still elevated, LDL 144 on Crestor 40. Add Zetia. If GI upset, stop it, and we'll try for PCSK9 - A1C looks great, 5.7 on metformin only - keep course - GAD7 score high - follow up in 2 weeks with lab check and visit to talk more about anxiety  Will ask front desk to call patient to schedule with me or colleague in 2- 3 weeks

## 2022-03-21 ENCOUNTER — Other Ambulatory Visit: Payer: Self-pay

## 2022-03-21 DIAGNOSIS — E782 Mixed hyperlipidemia: Secondary | ICD-10-CM

## 2022-03-21 MED ORDER — ROSUVASTATIN CALCIUM 40 MG PO TABS: 40.0000 mg | ORAL_TABLET | Freq: Every day | ORAL | 11 refills | Status: DC

## 2022-03-25 NOTE — Progress Notes (Unsigned)
[facility]: Clinic Note  Subjective:  History of Present Illness: Ann Stone is a 56 y.o. year old female who presents for 1 month follow up for anxiety.  Patient's anxiety & depression started in her 79s. No history of hospitalization or suicide attempt. She has been on Prozac for about 20 years, was first on '20mg'$  daily, and has been on '40mg'$  daily for years. Right now, she has many stressors in her life, especially with taking care of her mother with dementia. Good support with her husband. She's feeling anxious all the time, depressed, with poor appetite for 1 month, and difficulty sleeping. Able to fall asleep, but wakes after 3-4 hours, unable to fall back asleep, so only getting about 4 hours per night. No SI/HI.   HTN- she's taking the increased dose of Lisinopril 40 / HCTZ 25, doesn't have cuff at home. No chest pain or headaches. Believes the blood pressure is high from anxiety, and I agree.  HLD - tolerating Zetia well.  Discussed getting urine microalbumin today.  No other concerns.    Please refer to Assessment and Plan below for full details in Problem-Based Charting.   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   Hypertension, essential 06/29/2006     Medications:  Current Outpatient Medications:    FLUoxetine (PROZAC) 20 MG capsule, Take 3 capsules (60 mg total) by mouth daily., Disp: 270 capsule, Rfl: 3   hydrOXYzine (ATARAX) 10 MG tablet, Take 1 tablet (10 mg total) by mouth 3 (three) times daily as needed for anxiety., Disp: 90 tablet, Rfl: 3   ezetimibe (ZETIA) 10 MG tablet, Take 1  tablet (10 mg total) by mouth daily., Disp: 90 tablet, Rfl: 3   lisinopril-hydrochlorothiazide (ZESTORETIC) 20-12.5 MG tablet, Take 2 tablets by mouth daily., Disp: 90 tablet, Rfl: 3   metFORMIN (GLUCOPHAGE) 500 MG tablet, Take 2 tablets (1,000 mg total) by mouth daily with breakfast., Disp: 60 tablet, Rfl: 0   rosuvastatin (CRESTOR) 40 MG tablet, Take 1 tablet (40 mg total) by mouth daily., Disp: 30 tablet, Rfl: 11   Allergies: Allergies  Allergen Reactions   Influenza Vaccines     She had an allergic reaction in the past - arm became extremely swollen at the site of injection.    Metoprolol Tartrate     REACTION: Numbness, weakness and tingling   Penicillins      Objective:   Vitals: Vitals:   03/26/22 0921  BP: (!) 179/87  Pulse: 86  Temp: 98.6 F (37 C)  SpO2: 100%    Physical Exam: Physical Exam Constitutional:      Appearance: Normal appearance.  HENT:     Head: Normocephalic and atraumatic.  Neurological:     General: No focal deficit present.     Mental Status: She is alert. Mental status is at baseline.  Psychiatric:     Comments: +anxious, +depressed, no SI/HI, judgement & insight in tact      Data: Labs, imaging, and micro were reviewed in Epic. Refer to Assessment and Plan below for full details in Problem-Based Charting.  Assessment & Plan:  Generalized anxiety disorder -  GAD 7 is 20 - Increase Prozac from 40 to '60mg'$  daily - Start Hydroxyzine '25mg'$  TID prn severe anxiety - Referral to [facility] - 4 week follow-up in [facility]   Hypertension, essential - she is very anxious in clinic today, and I suspect this is contributing to her high blood pressure - BMP today - Continue Lisinopril 40, HCTZ '25mg'$  daily - If still elevated in 4 weeks, would add another agent (probably amlodipine '5mg'$  daily) - 4 week follow-up  Hyperlipidemia - Continue Crestor '40mg'$  daily with Zetia (started 02/2022) - Repeat lipids in a few visits, too  soon to recheck now  Diabetes ([place]) - A1C 5.7 in 02/2022, not due for recheck - Continue Metformin '1000mg'$  BID - Eye referral is in - Foot exam normal in 02/2022 - checking urine MACR today      Patient will follow up in 4 weeks. At that visit please: Repeat PHQ9 & GAD7 (score was 20). Assess how anxiety is on new medicines. Refer to [facility] if she hasn't seen [facility] yet Follow up blood pressure. If still high, would suggest Amlo '5mg'$  daily  Follow up on eye exam & MMG, both ordered at last visit in August   Lottie Mussel, MD

## 2022-03-26 ENCOUNTER — Ambulatory Visit (INDEPENDENT_AMBULATORY_CARE_PROVIDER_SITE_OTHER): Payer: Commercial Managed Care - HMO | Admitting: Internal Medicine

## 2022-03-26 ENCOUNTER — Encounter: Payer: Self-pay | Admitting: Internal Medicine

## 2022-03-26 VITALS — BP 179/87 | HR 86 | Temp 98.6°F | Ht 65.0 in | Wt 203.2 lb

## 2022-03-26 DIAGNOSIS — E782 Mixed hyperlipidemia: Secondary | ICD-10-CM

## 2022-03-26 DIAGNOSIS — I10 Essential (primary) hypertension: Secondary | ICD-10-CM | POA: Diagnosis not present

## 2022-03-26 DIAGNOSIS — Z7984 Long term (current) use of oral hypoglycemic drugs: Secondary | ICD-10-CM

## 2022-03-26 DIAGNOSIS — F411 Generalized anxiety disorder: Secondary | ICD-10-CM | POA: Insufficient documentation

## 2022-03-26 DIAGNOSIS — F320 Major depressive disorder, single episode, mild: Secondary | ICD-10-CM

## 2022-03-26 DIAGNOSIS — E119 Type 2 diabetes mellitus without complications: Secondary | ICD-10-CM

## 2022-03-26 DIAGNOSIS — Z87891 Personal history of nicotine dependence: Secondary | ICD-10-CM

## 2022-03-26 MED ORDER — HYDROXYZINE HCL 10 MG PO TABS: 10.0000 mg | ORAL_TABLET | Freq: Three times a day (TID) | ORAL | 3 refills | Status: AC | PRN

## 2022-03-26 MED ORDER — FLUOXETINE HCL 20 MG PO CAPS: 60.0000 mg | ORAL_CAPSULE | Freq: Every day | ORAL | 3 refills | Status: DC

## 2022-03-26 NOTE — Assessment & Plan Note (Signed)
-   she is very anxious in clinic today, and I suspect this is contributing to her high blood pressure - BMP today - Continue Lisinopril 40, HCTZ '25mg'$  daily - If still elevated in 4 weeks, would add another agent (probably amlodipine '5mg'$  daily) - 4 week follow-up

## 2022-03-26 NOTE — Assessment & Plan Note (Addendum)
-   GAD 7 is 20 - Increase Prozac from 40 to '60mg'$  daily - Start Hydroxyzine '25mg'$  TID prn severe anxiety - Referral to [facility] - 4 week follow-up in [facility] clinic

## 2022-03-26 NOTE — Patient Instructions (Signed)
Dear Ms Ramone,  Thank you for coming to see me in clinic today.  We mainly focused on your anxiety and depression.   We are going to do 3 things today:  1.) Increase Prozac from '40mg'$  daily to '60mg'$  daily. You'll need to take 3 tablets of the '20mg'$  pills.  2.) Start Hydroxyzine for anxiety. You can take up to 3 pills daily as needed for episodes of severe anxiety.  3.) Refer you to [facility] for more support. Their phone number is [phone]. They offer Psychiatry and Counseling. I believe they take your insurance, but if you run into any difficulty, please call our office, and we will place a separate referral to [facility] services.  Our clinic will see you back in 1 month to follow up on your anxiety and blood pressure. I will be out on my maternity leave, so I'll have you see one of my colleagues.  Please call if you have any questions or concerns.  Dr. Lottie Mussel

## 2022-03-26 NOTE — Assessment & Plan Note (Signed)
-   Continue Crestor '40mg'$  daily with Zetia (started 02/2022) - Repeat lipids in a few visits, too soon to recheck now

## 2022-03-26 NOTE — Assessment & Plan Note (Signed)
-   A1C 5.7 in 02/2022, not due for recheck - Continue Metformin '1000mg'$  BID - Eye referral is in - Foot exam normal in 02/2022 - checking urine MACR today

## 2022-03-27 LAB — BMP8+ANION GAP
Anion Gap: 17 mmol/L (ref 10.0–18.0)
BUN/Creatinine Ratio: 11 (ref 9–23)
BUN: 8 mg/dL (ref 6–24)
CO2: 25 mmol/L (ref 20–29)
Calcium: 10.1 mg/dL (ref 8.7–10.2)
Chloride: 96 mmol/L (ref 96–106)
Creatinine, Ser: 0.76 mg/dL (ref 0.57–1.00)
Glucose: 94 mg/dL (ref 70–99)
Potassium: 3.9 mmol/L (ref 3.5–5.2)
Sodium: 138 mmol/L (ref 134–144)
eGFR: 93 mL/min/{1.73_m2} (ref >59)

## 2022-03-27 LAB — MICROALBUMIN / CREATININE URINE RATIO
Creatinine, Urine: 34.6 mg/dL
Microalb/Creat Ratio: 9 mg/g creat (ref 0–29)
Microalbumin, Urine: 3 ug/mL

## 2022-03-27 NOTE — Progress Notes (Signed)
Hello Mrs Seif,  I reviewed your labs, and everything looks normal.   Keep taking the medicines like we discussed yesterday, and we will see you back in clinic in 4 weeks to follow up with your anxiety and blood pressure.  Kindly, Dr. Lottie Mussel

## 2022-04-23 ENCOUNTER — Encounter: Payer: Commercial Managed Care - HMO | Admitting: Student

## 2022-05-09 ENCOUNTER — Other Ambulatory Visit: Payer: Self-pay

## 2022-05-09 DIAGNOSIS — I10 Essential (primary) hypertension: Secondary | ICD-10-CM

## 2022-05-09 MED ORDER — LISINOPRIL-HYDROCHLOROTHIAZIDE 20-12.5 MG PO TABS: 2.0000 | ORAL_TABLET | Freq: Every day | ORAL | 3 refills | Status: DC

## 2022-05-09 NOTE — Telephone Encounter (Signed)
Patient has scheduled appt with Resident on 11/6 at [place]. She takes 2 tabs of Zestoretic per day. She is requesting #180 tabs be sent to [facility].

## 2022-05-09 NOTE — Addendum Note (Signed)
Addended by: Velora Heckler on: 05/09/2022 03:01 PM   Modules accepted: Orders

## 2022-05-09 NOTE — Telephone Encounter (Signed)
Requesting to speak with a nurse about  lisinopril-hydrochlorothiazide (ZESTORETIC) 20-12.5 MG tablet. Please call pt back.

## 2022-05-19 ENCOUNTER — Ambulatory Visit (INDEPENDENT_AMBULATORY_CARE_PROVIDER_SITE_OTHER): Payer: Commercial Managed Care - HMO | Admitting: Internal Medicine

## 2022-05-19 VITALS — BP 126/78 | HR 101 | Temp 98.2°F | Ht 65.0 in | Wt 194.5 lb

## 2022-05-19 DIAGNOSIS — E782 Mixed hyperlipidemia: Secondary | ICD-10-CM

## 2022-05-19 DIAGNOSIS — N898 Other specified noninflammatory disorders of vagina: Secondary | ICD-10-CM

## 2022-05-19 DIAGNOSIS — I10 Essential (primary) hypertension: Secondary | ICD-10-CM

## 2022-05-19 DIAGNOSIS — L910 Hypertrophic scar: Secondary | ICD-10-CM

## 2022-05-19 DIAGNOSIS — J019 Acute sinusitis, unspecified: Secondary | ICD-10-CM

## 2022-05-19 DIAGNOSIS — E119 Type 2 diabetes mellitus without complications: Secondary | ICD-10-CM | POA: Diagnosis not present

## 2022-05-19 DIAGNOSIS — Z7984 Long term (current) use of oral hypoglycemic drugs: Secondary | ICD-10-CM

## 2022-05-19 DIAGNOSIS — F411 Generalized anxiety disorder: Secondary | ICD-10-CM

## 2022-05-19 DIAGNOSIS — Z87891 Personal history of nicotine dependence: Secondary | ICD-10-CM

## 2022-05-19 LAB — POCT GLYCOSYLATED HEMOGLOBIN (HGB A1C): Hemoglobin A1C: 6 % — AB (ref 4.0–5.6)

## 2022-05-19 LAB — GLUCOSE, CAPILLARY: Glucose-Capillary: 89 mg/dL (ref 70–99)

## 2022-05-19 NOTE — Patient Instructions (Signed)
Thank you, [name] for allowing [place] to provide your care today.   Diabetes A1c was a littlie higher this time at 6. Our goal is to keep this less than 7. Continue taking metformin 100 mg daily.  Cholesterol I will call with results of this  I am glad that your anxiety is better. Pleas consider talk therapy if this interests you.  Sinus congestion Try sinus irrigation with Neti pot, you can also continue using decongestants. If your symptoms do not improve then give clinic a call by Thursday.  Keloid. Please give Dr. Donell Sievert office a call about your keloid.  I have ordered the following labs for you:   Lab Orders         Lipid Profile         Glucose, capillary         POC Hbg A1C       I have ordered the following medication/changed the following medications:   Stop the following medications: There are no discontinued medications.   Start the following medications: No orders of the defined types were placed in this encounter.    Follow up: 3 months   We look forward to seeing you next time. Please call our clinic at [phone] if you have any questions or concerns. The best time to call is Monday-Friday from 9am-4pm, but there is someone available 24/7. If after hours or the weekend, call the main hospital number and ask for the Internal Medicine Resident On-Call. If you need medication refills, please notify your pharmacy one week in advance and they will send [place] a request.   Thank you for trusting me with your care. Wishing you the best!   Ann Stone, [place]

## 2022-05-19 NOTE — Progress Notes (Addendum)
Subjective:  CC: follow-up for diabetes, hypertension and anxiety  HPI:  [name] is a 56 y.o. female with a past medical history stated below and presents today for follow-up on diabetes, hypertension and anxiety. She also has had 3 days of sinus congestion with sore throat. Please see problem based assessment and plan for additional details.  Past Medical History:  Diagnosis Date   Breast pain 09/08/2017   Depression    GERD (gastroesophageal reflux disease)    HTN (hypertension)    Hyperlipemia    Leiomyoma of uterus    s/p hysterectomy, hx of uterine hemorrhage   Tobacco abuse     Current Outpatient Medications on File Prior to Visit  Medication Sig Dispense Refill   ezetimibe (ZETIA) 10 MG tablet Take 1 tablet (10 mg total) by mouth daily. 90 tablet 3   FLUoxetine (PROZAC) 20 MG capsule Take 3 capsules (60 mg total) by mouth daily. 270 capsule 3   hydrOXYzine (ATARAX) 10 MG tablet Take 1 tablet (10 mg total) by mouth 3 (three) times daily as needed for anxiety. 90 tablet 3   lisinopril-hydrochlorothiazide (ZESTORETIC) 20-12.5 MG tablet Take 2 tablets by mouth daily. 180 tablet 3   metFORMIN (GLUCOPHAGE) 500 MG tablet Take 2 tablets (1,000 mg total) by mouth daily with breakfast. 60 tablet 0   rosuvastatin (CRESTOR) 40 MG tablet Take 1 tablet (40 mg total) by mouth daily. 30 tablet 11   No current facility-administered medications on file prior to visit.    Family History  Problem Relation Age of Onset   Cancer Mother    Osteoarthritis Mother    Cancer Father     Social History   Socioeconomic History   Marital status: Single    Spouse name: Not on file   Number of children: Not on file   Years of education: Not on file   Highest education level: Not on file  Occupational History   Not on file  Tobacco Use   Smoking status: Former    Packs/day: 1.00    Types: Cigarettes    Quit date: 08/02/2018    Years since quitting: 3.8   Smokeless tobacco:  Never  Vaping Use   Vaping Use: Never used  Substance and Sexual Activity   Alcohol use: Yes    Comment: occasional   Drug use: Never   Sexual activity: Yes    Birth control/protection: Surgical  Other Topics Concern   Not on file  Social History Narrative   Not on file   Social Determinants of Health   Financial Resource Strain: Not on file  Food Insecurity: Not on file  Transportation Needs: Not on file  Physical Activity: Not on file  Stress: Not on file  Social Connections: Not on file  Intimate Partner Violence: Not on file    Review of Systems: ROS negative except for what is noted on the assessment and plan.  Objective:   Vitals:   05/19/22 0947  BP: 126/78  Pulse: (!) 101  Temp: 98.2 F (36.8 C)  TempSrc: Oral  SpO2: 100%  Weight: 194 lb 8 oz (88.2 kg)  Height: '5\' 5"'$  (1.651 m)    Physical Exam: Constitutional: well-appearing  HENT: keloid present to left pinna Cardiovascular: regular rate and rhythm, no m/r/g Pulmonary/Chest: normal work of breathing on room air, lungs clear to auscultation bilaterally Abdominal: soft, non-tender, non-distended MSK: normal bulk and tone Skin: warm and dry   Assessment & Plan:  Generalized anxiety disorder  GAD 7 score improved from 20 9/23 to 3 11/23. She has been tolerating prozac 60 mg with no side effects. She did not reach out to [facility] as she is not interested in therapy at this time. -continue prozac 60 mg daily  Diabetes ([place]) A1c increased from 5.7 8/23 to 6.0. She reports adherence with metformin 1000 mg qd. She denies side effects to medication. -repeat A1c  in 01/23 -if A1c continues to increase then would increase metformin dosing.  Keloid She previously followed with plastic surgery for keloid on left ear. She has procedure there earlier this years. She is concerned that keloid is returning.  P: I encouraged her to follow-up with plastic surgery, Dr. Claudia Desanctis  Acute sinus infection Presents with  symptoms of sinus congestion for last 3 days. She also has sore throat for same time period. She has tried nasal decongestant with improvement in symptoms. Denies fever, chills, headache. Assessment: Presentation consistent with sinus infection Plan: Sinus rinse with saline Continue decongestant I talked with patient to return call to clinic if symptoms persist past 11/9. If so would prescribe 5 day course of amoxicillin '875mg'$  BID.   Hypertension, essential Blood pressure improved at 126/78. She is adherent with Lisinopril 40 mg qd and HCTZ 25 mg qd.  -Continue medications  Hyperlipidemia Repeat lipid panel showed LDL of 151. I called and reviewed the result with her. She is continuing try to lose weight with diet and exercise and has lost about 20 lbs over the last 9 months. She has missed several doses of crestor and zetia in the last few weeks. Plan: Continue crestor 40 mg and zetia 10 mg Continue lifestyle modifications   Vaginal mass She has not been to see GYN for this. I gave her contact information for [facility]. I will also put in another referral as prior has closed.    Patient discussed with Dr. Juluis Rainier Ann Stone, D.O. [facility]  PGY-2 Pager: [phone]  Phone: [phone] Date 05/20/2022  Time 2:15 PM

## 2022-05-20 DIAGNOSIS — L910 Hypertrophic scar: Secondary | ICD-10-CM | POA: Insufficient documentation

## 2022-05-20 DIAGNOSIS — J019 Acute sinusitis, unspecified: Secondary | ICD-10-CM | POA: Insufficient documentation

## 2022-05-20 HISTORY — DX: Hypertrophic scar: L91.0

## 2022-05-20 LAB — LIPID PANEL
Chol/HDL Ratio: 3.9 ratio (ref 0.0–4.4)
Cholesterol, Total: 239 mg/dL — ABNORMAL HIGH (ref 100–199)
HDL: 62 mg/dL (ref >39)
LDL Chol Calc (NIH): 151 mg/dL — ABNORMAL HIGH (ref 0–99)
Triglycerides: 145 mg/dL (ref 0–149)
VLDL Cholesterol Cal: 26 mg/dL (ref 5–40)

## 2022-05-20 NOTE — Assessment & Plan Note (Signed)
She previously followed with plastic surgery for keloid on left ear. She has procedure there earlier this years. She is concerned that keloid is returning.  P: I encouraged her to follow-up with plastic surgery, Dr. Claudia Desanctis

## 2022-05-20 NOTE — Assessment & Plan Note (Addendum)
Presents with symptoms of sinus congestion for last 3 days. She also has sore throat for same time period. She has tried nasal decongestant with improvement in symptoms. Denies fever, chills, headache. Assessment: Presentation consistent with sinus infection Plan: Sinus rinse with saline Continue decongestant I talked with patient to return call to clinic if symptoms persist past 11/9. If so would prescribe 5 day course of amoxicillin '875mg'$  BID.

## 2022-05-20 NOTE — Assessment & Plan Note (Signed)
GAD 7 score improved from 20 9/23 to 3 11/23. She has been tolerating prozac 60 mg with no side effects. She did not reach out to [facility] as she is not interested in therapy at this time. -continue prozac 60 mg daily

## 2022-05-20 NOTE — Assessment & Plan Note (Signed)
Repeat lipid panel showed LDL of 151. I called and reviewed the result with her. She is continuing try to lose weight with diet and exercise and has lost about 20 lbs over the last 9 months. She has missed several doses of crestor and zetia in the last few weeks. Plan: Continue crestor 40 mg and zetia 10 mg Continue lifestyle modifications

## 2022-05-20 NOTE — Addendum Note (Signed)
Addended by: Edwyna Perfect on: 05/20/2022 02:15 PM   Modules accepted: Orders

## 2022-05-20 NOTE — Assessment & Plan Note (Signed)
Blood pressure improved at 126/78. She is adherent with Lisinopril 40 mg qd and HCTZ 25 mg qd.  -Continue medications

## 2022-05-20 NOTE — Assessment & Plan Note (Signed)
A1c increased from 5.7 8/23 to 6.0. She reports adherence with metformin 1000 mg qd. She denies side effects to medication. -repeat A1c  in 01/23 -if A1c continues to increase then would increase metformin dosing.

## 2022-05-20 NOTE — Assessment & Plan Note (Signed)
She has not been to see GYN for this. I gave her contact information for [facility]. I will also put in another referral as prior has closed.

## 2022-05-23 NOTE — Progress Notes (Signed)
Internal Medicine Clinic Attending  Case discussed with the resident at the time of the visit.  We reviewed the resident's history and exam and pertinent patient test results.  I agree with the assessment, diagnosis, and plan of care documented in the resident's note.  

## 2022-06-23 ENCOUNTER — Other Ambulatory Visit: Payer: Self-pay | Admitting: Internal Medicine

## 2022-06-23 DIAGNOSIS — E669 Obesity, unspecified: Secondary | ICD-10-CM

## 2022-06-24 NOTE — Telephone Encounter (Signed)
Pt calling to f/u with her medication refill  metFORMIN (GLUCOPHAGE) 500 MG tablet [Pharmacy Med Name: metFORMIN HCl 500 MG Oral Tablet] [facility] [place] - [address] [facility] ([place]) [place] [address] Phone: [phone]  Fax: [phone] DEA #: --

## 2022-07-29 ENCOUNTER — Other Ambulatory Visit: Payer: Self-pay

## 2022-07-29 DIAGNOSIS — E669 Obesity, unspecified: Secondary | ICD-10-CM

## 2022-07-29 MED ORDER — METFORMIN HCL 500 MG PO TABS: 1000.0000 mg | ORAL_TABLET | Freq: Every day | ORAL | 3 refills | Status: DC

## 2022-08-19 NOTE — Progress Notes (Deleted)
[facility]: Clinic Note  Subjective:  History of Present Illness: Ann Stone is a 57 y.o. year old female who presents for ***   # GAD - GAD 7 today:  (was 20 then 3) - Prozac '60mg'$  daily   # T2DM - A1C 6.0 in 05/2012 - Metformin 1g daily   # Keloid L ear - Plasic surgery, Dr. Claudia Desanctis  # HTN - lisin 40, hctz 25  # HLD - crestor, zetia    Check on eye exam, mmg, cologuard  Please refer to Assessment and Plan below for full details in Problem-Based Charting.   Past Medical History:  Patient Active Problem List   Diagnosis Date Noted   Keloid 05/20/2022   Acute sinus infection 05/20/2022   Generalized anxiety disorder 03/26/2022   Gout 02/26/2022   Hair loss 05/30/2021   Diabetes ([place]) 05/29/2021   Vaginal mass 04/25/2021   Sebaceous cyst 10/08/2020   Preventative health care 10/06/2016   Depression 07/22/2010   Hyperlipidemia 08/02/2008   Tobacco use disorder, moderate, in sustained remission 06/29/2006   Hypertension, essential 06/29/2006    Social History: Reviewed in [place]. Pertinent updates today include: ***  Family History: Reviewed in Epic. Pertinent updates today include: None  Medications:  Current Outpatient Medications:    ezetimibe (ZETIA) 10 MG tablet, Take 1 tablet (10 mg total) by mouth daily., Disp: 90 tablet, Rfl: 3   FLUoxetine (PROZAC) 20 MG capsule, Take 3 capsules (60 mg total) by mouth daily., Disp: 270 capsule, Rfl: 3   lisinopril-hydrochlorothiazide (ZESTORETIC) 20-12.5 MG tablet, Take 2 tablets by mouth daily., Disp: 180 tablet, Rfl: 3   metFORMIN (GLUCOPHAGE) 500 MG tablet, Take 2 tablets (1,000 mg total) by mouth daily with breakfast., Disp: 180 tablet, Rfl: 3   rosuvastatin (CRESTOR) 40 MG tablet, Take 1 tablet (40 mg total) by mouth daily., Disp: 30 tablet, Rfl: 11   Allergies: Allergies  Allergen Reactions   Influenza Vaccines     She had an allergic reaction in the past - arm became extremely swollen  at the site of injection.    Metoprolol Tartrate     REACTION: Numbness, weakness and tingling   Penicillins     Review of Systems: ROS   Objective:   Vitals: There were no vitals filed for this visit.  Physical Exam: Physical Exam   Data: Labs, imaging, and micro were reviewed in Epic. Refer to Assessment and Plan below for full details in Problem-Based Charting.  Assessment & Plan:  No problem-specific Assessment & Plan notes found for this encounter.     Patient will follow up in ***  Lottie Mussel, MD

## 2022-08-20 ENCOUNTER — Encounter: Payer: Commercial Managed Care - HMO | Admitting: Internal Medicine

## 2022-08-20 ENCOUNTER — Other Ambulatory Visit: Payer: Self-pay

## 2022-10-21 NOTE — Progress Notes (Deleted)
[facility]: Clinic Note  Subjective:  History of Present Illness: Ann Stone is a 57 y.o. year old female who presents for routine follow up of her chronic medical conditiosn  # depression/anxiety - prozac 60 - hydrox 25mg  TID  # HTN - lisin 40, hctz 25  # HLD - crestor 40, zetia   # DM - met 1000mg  BID  Gyn for vaginal mass- she did not call  Please refer to Assessment and Plan below for full details in Problem-Based Charting.   Past Medical History:  Patient Active Problem List   Diagnosis Date Noted   Keloid 05/20/2022   Acute sinus infection 05/20/2022   Generalized anxiety disorder 03/26/2022   Gout 02/26/2022   Hair loss 05/30/2021   Diabetes 05/29/2021   Vaginal mass 04/25/2021   Sebaceous cyst 10/08/2020   Preventative health care 10/06/2016   Depression 07/22/2010   Hyperlipidemia 08/02/2008   Tobacco use disorder, moderate, in sustained remission 06/29/2006   Hypertension, essential 06/29/2006    Social History: Reviewed in [place]. Pertinent updates today include: ***  Family History: Reviewed in Epic. Pertinent updates today include: None  Medications:  Current Outpatient Medications:    ezetimibe (ZETIA) 10 MG tablet, Take 1 tablet (10 mg total) by mouth daily., Disp: 90 tablet, Rfl: 3   FLUoxetine (PROZAC) 20 MG capsule, Take 3 capsules (60 mg total) by mouth daily., Disp: 270 capsule, Rfl: 3   lisinopril-hydrochlorothiazide (ZESTORETIC) 20-12.5 MG tablet, Take 2 tablets by mouth daily., Disp: 180 tablet, Rfl: 3   metFORMIN (GLUCOPHAGE) 500 MG tablet, Take 2 tablets (1,000 mg total) by mouth daily with breakfast., Disp: 180 tablet, Rfl: 3   rosuvastatin (CRESTOR) 40 MG tablet, Take 1 tablet (40 mg total) by mouth daily., Disp: 30 tablet, Rfl: 11   Allergies: Allergies  Allergen Reactions   Influenza Vaccines     She had an allergic reaction in the past - arm became extremely swollen at the site of injection.     Metoprolol Tartrate     REACTION: Numbness, weakness and tingling   Penicillins     Review of Systems: ROS   Objective:   Vitals: There were no vitals filed for this visit.  Physical Exam: Physical Exam   Data: Labs, imaging, and micro were reviewed in Epic. Refer to Assessment and Plan below for full details in Problem-Based Charting.  Assessment & Plan:  No problem-specific Assessment & Plan notes found for this encounter.     Patient will follow up in ***  Mercie Eon, MD

## 2022-10-22 ENCOUNTER — Encounter: Payer: Commercial Managed Care - HMO | Admitting: Internal Medicine

## 2022-12-25 ENCOUNTER — Other Ambulatory Visit: Payer: Self-pay

## 2022-12-25 DIAGNOSIS — I10 Essential (primary) hypertension: Secondary | ICD-10-CM

## 2023-01-05 ENCOUNTER — Encounter: Payer: Commercial Managed Care - HMO | Admitting: Student

## 2023-01-19 ENCOUNTER — Ambulatory Visit: Payer: Commercial Managed Care - HMO | Admitting: Internal Medicine

## 2023-01-19 ENCOUNTER — Ambulatory Visit (HOSPITAL_COMMUNITY)
Admission: RE | Admit: 2023-01-19 | Discharge: 2023-01-19 | Disposition: A | Discharge status: Home / Self Care | Payer: Commercial Managed Care - HMO | Source: Ambulatory Visit | Attending: Family Medicine | Admitting: Family Medicine

## 2023-01-19 VITALS — BP 117/75 | HR 123 | Temp 98.7°F | Ht 64.0 in | Wt 198.1 lb

## 2023-01-19 DIAGNOSIS — N898 Other specified noninflammatory disorders of vagina: Secondary | ICD-10-CM

## 2023-01-19 DIAGNOSIS — E782 Mixed hyperlipidemia: Secondary | ICD-10-CM

## 2023-01-19 DIAGNOSIS — E119 Type 2 diabetes mellitus without complications: Secondary | ICD-10-CM | POA: Diagnosis not present

## 2023-01-19 DIAGNOSIS — Z1159 Encounter for screening for other viral diseases: Secondary | ICD-10-CM

## 2023-01-19 DIAGNOSIS — H538 Other visual disturbances: Secondary | ICD-10-CM | POA: Diagnosis not present

## 2023-01-19 DIAGNOSIS — E785 Hyperlipidemia, unspecified: Secondary | ICD-10-CM

## 2023-01-19 DIAGNOSIS — L910 Hypertrophic scar: Secondary | ICD-10-CM | POA: Diagnosis not present

## 2023-01-19 DIAGNOSIS — R000 Tachycardia, unspecified: Secondary | ICD-10-CM

## 2023-01-19 DIAGNOSIS — Z7984 Long term (current) use of oral hypoglycemic drugs: Secondary | ICD-10-CM

## 2023-01-19 DIAGNOSIS — Z1211 Encounter for screening for malignant neoplasm of colon: Secondary | ICD-10-CM

## 2023-01-19 LAB — POCT GLYCOSYLATED HEMOGLOBIN (HGB A1C): Hemoglobin A1C: 5.9 % — AB (ref 4.0–5.6)

## 2023-01-19 LAB — GLUCOSE, CAPILLARY: Glucose-Capillary: 135 mg/dL — ABNORMAL HIGH (ref 70–99)

## 2023-01-19 NOTE — Progress Notes (Signed)
Subjective:  CC: diabetes follow-up  HPI:  [name] is a 57 y.o. female with a past medical history stated below and presents today for follow-up on chronic medical conditions. Please see problem based assessment and plan for additional details.  Past Medical History:  Diagnosis Date   Breast pain 09/08/2017   Depression    Diabetes (HCC)    GERD (gastroesophageal reflux disease)    HTN (hypertension)    Hyperlipemia    Leiomyoma of uterus    s/p hysterectomy, hx of uterine hemorrhage   Tobacco abuse    Vaginal mass     Current Outpatient Medications on File Prior to Visit  Medication Sig Dispense Refill   ezetimibe (ZETIA) 10 MG tablet Take 1 tablet (10 mg total) by mouth daily. 90 tablet 3   FLUoxetine (PROZAC) 20 MG capsule Take 3 capsules (60 mg total) by mouth daily. 270 capsule 3   lisinopril-hydrochlorothiazide (ZESTORETIC) 20-12.5 MG tablet Take 2 tablets by mouth daily. 180 tablet 3   metFORMIN (GLUCOPHAGE) 500 MG tablet Take 2 tablets (1,000 mg total) by mouth daily with breakfast. 180 tablet 3   rosuvastatin (CRESTOR) 40 MG tablet Take 1 tablet (40 mg total) by mouth daily. 30 tablet 11   No current facility-administered medications on file prior to visit.    Family History  Problem Relation Age of Onset   Cancer Mother    Osteoarthritis Mother    Cancer Father     Social History   Socioeconomic History   Marital status: Single    Spouse name: Not on file   Number of children: Not on file   Years of education: Not on file   Highest education level: Not on file  Occupational History   Not on file  Tobacco Use   Smoking status: Former    Packs/day: 1    Types: Cigarettes    Quit date: 08/02/2018    Years since quitting: 4.4   Smokeless tobacco: Never  Vaping Use   Vaping Use: Never used  Substance and Sexual Activity   Alcohol use: Yes    Comment: occasional   Drug use: Never   Sexual activity: Yes    Birth control/protection:  Surgical  Other Topics Concern   Not on file  Social History Narrative   Not on file   Social Determinants of Health   Financial Resource Strain: Not on file  Food Insecurity: Not on file  Transportation Needs: Not on file  Physical Activity: Not on file  Stress: Not on file  Social Connections: Not on file  Intimate Partner Violence: Not on file    Review of Systems: ROS negative except for what is noted on the assessment and plan.  Objective:   Vitals:   01/19/23 1340 01/19/23 1406  BP: 117/75   Pulse: (!) 120 (!) 123  Temp: 98.7 F (37.1 C)   TempSrc: Oral   SpO2: 97%   Weight: 198 lb 1.6 oz (89.9 kg)   Height: 5\' 4"  (1.626 m)     Physical Exam: Constitutional: well-appearing  Cardiovascular: regular rate and rhythm, no m/r/g Pulmonary/Chest: normal work of breathing on room air, lungs clear to auscultation bilaterally Abdominal: soft, non-tender, non-distended MSK: normal bulk and tone Skin: warm and dry   Assessment & Plan:  Sinus tachycardia Heart rate elevated to 120s in clinic. She denies shortness of breath, dizziness, chest pain, or palpitations. She has prior history of tachycardia and was referred to cardiology. She saw  Dr. Wyline Mood who ordered heart monitor but this was not completed. P: EKG completed and showed sinus tachycardia.  It is reassuring that she is asymptomatic, however would like to know how often she is having rates this elevated.  Referral to cardiology for heart monitor BMP CBC  Blurry vision, bilateral History of blurry vision with reading for greater than a year. This corrects with reading glasses. She has never had to wear glasses in the past. Her far vision seems about the same.  Likely presbyopia, she would like referral to eye doctor to see if she would benefit from prescription glasses. -referral to ophthalmology placed  Screening for colon cancer Discussed different options for colon cancer screening. She would prefer to  complete cologuard. -order placed  Diabetes (HCC) A1c remains well controlled at 5.9. She is adherent with metformin 1000 mg every day. P: Continue current medications Insurance denied semaglutide before. If A1c increases at follow-up could consider sending dulaglutide or liraglutide  Hyperlipidemia Lab Results  Component Value Date   CHOL 249 (H) 01/19/2023   HDL 53 01/19/2023   LDLCALC 144 (H) 01/19/2023   TRIG 284 (H) 01/19/2023   CHOLHDL 4.7 (H) 01/19/2023    LDL decreased from 151 to 440 but remains above goal for primary prevention. She is not adherent with rosuvastatin 40 but denies side effects. She states that she forgets to take several doses weekly. She is taking zetia. P: Continue rosuvastatin 40 mg, adherence encouraged Continue zetia 10 mg qd  Vaginal mass She has never seen gyn since 2 mm mass found on pelvic exam 10/22. Since then she has not noted any abnormalities. P: Referral placed to GYN  Keloid She requests referral back to plastic surgery for keloid on left ear.  -referral placed    Patient discussed with Dr. Cleda Daub   Dawt Reeb, D.O. [facility]  PGY-3 Pager: [phone]  Phone: [phone] Date 01/20/2023  Time 8:26 AM

## 2023-01-19 NOTE — Patient Instructions (Addendum)
Thank you, Ms.Dionisio David Wares for allowing [place] to provide your care today.   Fast heart rate I am checking a few labs to evaluate this. I do think it would be good for you to go back to cardiology. Depending on how often this is happening they may want to add a medication to slow your heart rate.  Your diabetes is well-controlled. Please continue taking metformin. I am checking vitamin b12 as metformin can decrease this.  Cholesterol- I will call with results.  I placed a referral to Plastic surgery and to ophthalmology. Please give clinic a call if you haven't heard from them in next 1-2 weeks.  Please follow-up with GYN. I placed a referral for this. You had a mass on exam inside your vaginal canal last year.  I have ordered the following labs for you:  Lab Orders         Glucose, capillary         Cologuard         Lipid Profile         CBC no Diff         TSH         BMP8+Anion Gap         Vitamin B12         Hepatitis C Ab reflex to Quant PCR         POC Hbg A1C      Referrals ordered today:   Referral Orders         Ambulatory referral to Ophthalmology         Ambulatory referral to Plastic Surgery         Ambulatory referral to Cardiology         Ambulatory referral to Gynecology       I have ordered the following medication/changed the following medications:   Stop the following medications: There are no discontinued medications.   Start the following medications: No orders of the defined types were placed in this encounter.    Follow up: 3 months  We look forward to seeing you next time. Please call our clinic at [phone] if you have any questions or concerns. The best time to call is Monday-Friday from 9am-4pm, but there is someone available 24/7. If after hours or the weekend, call the main hospital number and ask for the Internal Medicine Resident On-Call. If you need medication refills, please notify your pharmacy one week in advance and they will send [place]  a request.   Thank you for trusting me with your care. Wishing you the best!   Rudene Christians, DO [facility]

## 2023-01-20 ENCOUNTER — Encounter: Payer: Self-pay | Admitting: Internal Medicine

## 2023-01-20 DIAGNOSIS — H538 Other visual disturbances: Secondary | ICD-10-CM | POA: Insufficient documentation

## 2023-01-20 LAB — CBC
Hematocrit: 44.4 % (ref 34.0–46.6)
Hemoglobin: 15.6 g/dL (ref 11.1–15.9)
MCH: 31.3 pg (ref 26.6–33.0)
MCHC: 35.1 g/dL (ref 31.5–35.7)
MCV: 89 fL (ref 79–97)
Platelets: 236 10*3/uL (ref 150–450)
RBC: 4.98 x10E6/uL (ref 3.77–5.28)
RDW: 12.8 % (ref 11.7–15.4)
WBC: 3.3 10*3/uL — ABNORMAL LOW (ref 3.4–10.8)

## 2023-01-20 LAB — VITAMIN B12: Vitamin B-12: 420 pg/mL (ref 232–1245)

## 2023-01-20 LAB — LIPID PANEL
Chol/HDL Ratio: 4.7 ratio — ABNORMAL HIGH (ref 0.0–4.4)
Cholesterol, Total: 249 mg/dL — ABNORMAL HIGH (ref 100–199)
HDL: 53 mg/dL (ref >39)
LDL Chol Calc (NIH): 144 mg/dL — ABNORMAL HIGH (ref 0–99)
Triglycerides: 284 mg/dL — ABNORMAL HIGH (ref 0–149)
VLDL Cholesterol Cal: 52 mg/dL — ABNORMAL HIGH (ref 5–40)

## 2023-01-20 LAB — BMP8+ANION GAP
Anion Gap: 19 mmol/L — ABNORMAL HIGH (ref 10.0–18.0)
BUN/Creatinine Ratio: 17 (ref 9–23)
BUN: 16 mg/dL (ref 6–24)
CO2: 27 mmol/L (ref 20–29)
Calcium: 9.9 mg/dL (ref 8.7–10.2)
Chloride: 90 mmol/L — ABNORMAL LOW (ref 96–106)
Creatinine, Ser: 0.96 mg/dL (ref 0.57–1.00)
Glucose: 110 mg/dL — ABNORMAL HIGH (ref 70–99)
Potassium: 3.9 mmol/L (ref 3.5–5.2)
Sodium: 136 mmol/L (ref 134–144)
eGFR: 70 mL/min/{1.73_m2} (ref >59)

## 2023-01-20 LAB — HCV AB W REFLEX TO QUANT PCR: HCV Ab: NONREACTIVE

## 2023-01-20 LAB — TSH: TSH: 3.43 u[IU]/mL (ref 0.450–4.500)

## 2023-01-20 LAB — HCV INTERPRETATION

## 2023-01-20 NOTE — Assessment & Plan Note (Signed)
She requests referral back to plastic surgery for keloid on left ear.  -referral placed

## 2023-01-20 NOTE — Assessment & Plan Note (Signed)
Heart rate elevated to 120s in clinic. She denies shortness of breath, dizziness, chest pain, or palpitations. She has prior history of tachycardia and was referred to cardiology. She saw Dr. Wyline Mood who ordered heart monitor but this was not completed. P: EKG completed and showed sinus tachycardia.  It is reassuring that she is asymptomatic, however would like to know how often she is having rates this elevated.  Referral to cardiology for heart monitor BMP CBC

## 2023-01-20 NOTE — Assessment & Plan Note (Signed)
A1c remains well controlled at 5.9. She is adherent with metformin 1000 mg every day. P: Continue current medications Insurance denied semaglutide before. If A1c increases at follow-up could consider sending dulaglutide or liraglutide

## 2023-01-20 NOTE — Assessment & Plan Note (Signed)
Discussed different options for colon cancer screening. She would prefer to complete cologuard. -order placed

## 2023-01-20 NOTE — Assessment & Plan Note (Signed)
Lab Results  Component Value Date   CHOL 249 (H) 01/19/2023   HDL 53 01/19/2023   LDLCALC 144 (H) 01/19/2023   TRIG 284 (H) 01/19/2023   CHOLHDL 4.7 (H) 01/19/2023    LDL decreased from 151 to 308 but remains above goal for primary prevention. She is not adherent with rosuvastatin 40 but denies side effects. She states that she forgets to take several doses weekly. She is taking zetia. P: Continue rosuvastatin 40 mg, adherence encouraged Continue zetia 10 mg qd

## 2023-01-20 NOTE — Assessment & Plan Note (Signed)
History of blurry vision with reading for greater than a year. This corrects with reading glasses. She has never had to wear glasses in the past. Her far vision seems about the same.  Likely presbyopia, she would like referral to eye doctor to see if she would benefit from prescription glasses. -referral to ophthalmology placed

## 2023-01-20 NOTE — Assessment & Plan Note (Signed)
She has never seen gyn since 2 mm mass found on pelvic exam 10/22. Since then she has not noted any abnormalities. P: Referral placed to GYN

## 2023-01-27 NOTE — Progress Notes (Signed)
 Internal Medicine Clinic Attending  Case discussed with the resident at the time of the visit.  We reviewed the resident's history and exam and pertinent patient test results.  I agree with the assessment, diagnosis, and plan of care documented in the resident's note.  

## 2023-03-11 ENCOUNTER — Institutional Professional Consult (permissible substitution): Payer: Commercial Managed Care - HMO | Admitting: Plastic Surgery

## 2023-04-01 ENCOUNTER — Telehealth: Payer: Self-pay | Admitting: Radiation Oncology

## 2023-04-01 ENCOUNTER — Ambulatory Visit (INDEPENDENT_AMBULATORY_CARE_PROVIDER_SITE_OTHER): Payer: Commercial Managed Care - HMO | Admitting: Plastic Surgery

## 2023-04-01 ENCOUNTER — Encounter: Payer: Self-pay | Admitting: Plastic Surgery

## 2023-04-01 VITALS — BP 176/124 | HR 97 | Ht 64.5 in | Wt 196.8 lb

## 2023-04-01 DIAGNOSIS — L910 Hypertrophic scar: Secondary | ICD-10-CM

## 2023-04-01 NOTE — Progress Notes (Signed)
Referring Provider Mercie Eon, MD [address] [place],  [place] [address]   CC:  Chief Complaint  Patient presents with   Advice Only      Ann Stone is an 57 y.o. female.  HPI: Ann Stone is a 57 year old female who presents today for evaluation of a keloid on the left ear that incorporates approximately 50 to 75% of the helical rim.  Patient underwent excision of a keloid on the left ear in January 2023.  No additional treatment was provided at that time.  She states that the keloid is slowly returned since surgery and is much larger than it was initially.  She is having pain and discomfort and would like to have the keloid removed.  She denies any significant problems other than hypertension.  Review of her chart reveals hyperlipidemia and non-insulin-dependent diabetes.  Allergies  Allergen Reactions   Influenza Vaccines     She had an allergic reaction in the past - arm became extremely swollen at the site of injection.    Metoprolol Tartrate     REACTION: Numbness, weakness and tingling   Penicillins     Outpatient Encounter Medications as of 04/01/2023  Medication Sig   ezetimibe (ZETIA) 10 MG tablet Take 1 tablet (10 mg total) by mouth daily.   FLUoxetine (PROZAC) 20 MG capsule Take 3 capsules (60 mg total) by mouth daily.   lisinopril-hydrochlorothiazide (ZESTORETIC) 20-12.5 MG tablet Take 2 tablets by mouth daily.   metFORMIN (GLUCOPHAGE) 500 MG tablet Take 2 tablets (1,000 mg total) by mouth daily with breakfast.   rosuvastatin (CRESTOR) 40 MG tablet Take 1 tablet (40 mg total) by mouth daily.   No facility-administered encounter medications on file as of 04/01/2023.     Past Medical History:  Diagnosis Date   Breast pain 09/08/2017   Depression    Diabetes (HCC)    GERD (gastroesophageal reflux disease)    HTN (hypertension)    Hyperlipemia    Leiomyoma of uterus    s/p hysterectomy, hx of uterine hemorrhage   Tobacco abuse    Vaginal mass      Past Surgical History:  Procedure Laterality Date   ABDOMINAL HYSTERECTOMY     OPERATIVE HYSTEROSCOPY      Family History  Problem Relation Age of Onset   Cancer Mother    Osteoarthritis Mother    Cancer Father     Social History   Social History Narrative   Not on file     Review of Systems General: Denies fevers, chills, weight loss CV: Denies chest pain, shortness of breath, palpitations Skin: Large keloid along the helical rim of the left ear  Physical Exam    04/01/2023   10:20 AM 04/01/2023   10:08 AM 01/19/2023    2:06 PM  Vitals with BMI  Height  5' 4.5"   Weight  196 lbs 13 oz   BMI  33.27   Systolic -- 176   Diastolic -- 124   Pulse  97 123    General:  No acute distress,  Alert and oriented, Non-Toxic, Normal speech and affect Integument: Patient has a keloid which incorporates greater than 50% of the helical rim on the left ear.  The keloid is particularly pronounced just above the lobule. Mammogram: Not applicable to current problem however patient does not have a recent documented mammogram.  Will offer to place an order if she likes. Assessment/Plan Keloid: Patient has a history of a left ear  keloid which was previously excised.  It is returned and is now larger than the initial photographs showed.  She will require excision with reconstruction which I believe should be done in the operating room.  She understands that it is unlikely that I will be able to return her ear to a normal appearance and she will likely lose a portion of the helical rim.  I also do not believe that she should undergo excision alone.  I have recommended that she consider post procedure radiation therapy.  I will make arrangements for this. Tachycardia: Patient has a history of tachycardia.  This is never been clearly explained.  She will need cardiac clearance prior to surgery.  Additionally she has hypertensive with a blood pressure of 176/124 today we will make referral back to  her primary care doctor to address this. Photographs were obtained today with her consent we will schedule her for surgery at her request.  [name] 04/01/2023, 12:31 PM

## 2023-04-01 NOTE — Telephone Encounter (Signed)
Called patient to schedule a consultation w. Dr. Mitzi Hansen. Patient requested appointment delay due to work.

## 2023-04-02 ENCOUNTER — Telehealth: Payer: Self-pay

## 2023-04-02 NOTE — Telephone Encounter (Signed)
Name: Ann Stone  DOB: [birth year]  MRN: [id number]  Primary Cardiologist: Maisie Fus, MD  Chart reviewed as part of pre-operative protocol coverage. Because of Ann Stone's past medical history and time since last visit, she will require a follow-up in-office visit in order to better assess preoperative cardiovascular risk.  Patient has an office visit scheduled with Dr. Carolan Clines on 04/16/23. Appointment notes have been updated to reflect need for pre-op evaluation.   Pre-op covering staff:  - Please contact requesting surgeon's office via preferred method (i.e, phone, fax) to inform them of need for appointment prior to surgery.  Rip Harbour, NP  04/02/2023, 4:48 PM

## 2023-04-02 NOTE — Telephone Encounter (Signed)
...  Pre-operative Risk Assessment    Patient Name: Ann Stone  DOB: 08-Jul-1966 MRN: [id number]  Last o/v 06/26/21 Next o/v 04/16/23    Request for Surgical Clearance    Procedure:   left ear keloid removal  Date of Surgery:  Clearance TBD                                 Surgeon:  dr Jean Rosenthal taylor Surgeon's Group [facility] Practice [facility] Phone number:  [phone] Fax number:  [phone]   Type of Clearance Requested:   - Medical    Type of Anesthesia:  General    Additional requests/questions:    [name]   04/02/2023, 2:30 PM

## 2023-04-02 NOTE — Telephone Encounter (Signed)
I adv pt that Dr. Alveda Reasons she check in with her PCP to have blood pressure checked. Pt stated she hadn't taken her HTN medication before her appt and that's why it was high. After taking her medication she said she took her bp and it had come down to her normal range.   She also stated that she has not had a recent mammogram and that an order submitted by Dr. Ladona Ridgel would be ok. I adv her that it would more than likely be to [facility] upstairs in our building. She conveyed understanding.

## 2023-04-02 NOTE — Telephone Encounter (Signed)
Created in error -duplicate

## 2023-04-03 NOTE — Telephone Encounter (Signed)
[place],  I put the order in for her.  Have a nice weekend.  Ree Kida

## 2023-04-03 NOTE — Addendum Note (Signed)
Addended by: Weyman Croon on: 04/03/2023 02:41 PM   Modules accepted: Orders

## 2023-04-06 NOTE — Progress Notes (Incomplete)
Location of Concern: Left Ear Keloid  Patient presented with complaints of left ear keloid recurrence.  Previous excision in 2023.  She reports having pain and discomfort to this area.  Past/Anticipated interventions by patient's surgeon/dermatologist for current problematic lesion, if any: Dr. Ladona Ridgel 04/01/2023 -She will require excision with reconstruction which I believe should be done in the operating room.  -I also do not believe that she should undergo excision alone. -I have recommended that she consider post procedure radiation therapy.    Past Keloids, if any: 1) Location/Histology/Intervention: Left Ear Keloid excision 2023 2) Location/Histology/Intervention:  3) Location/Histology/Intervention:     SAFETY ISSUES: Prior radiation?  Pacemaker/ICD?  Possible current pregnancy? Hysterectomy Is the patient on methotrexate?   Current Complaints / other details:

## 2023-04-07 ENCOUNTER — Ambulatory Visit: Payer: No Typology Code available for payment source

## 2023-04-07 ENCOUNTER — Ambulatory Visit: Payer: No Typology Code available for payment source | Admitting: Radiation Oncology

## 2023-04-07 ENCOUNTER — Telehealth: Payer: Self-pay

## 2023-04-07 DIAGNOSIS — L910 Hypertrophic scar: Secondary | ICD-10-CM

## 2023-04-07 NOTE — Telephone Encounter (Signed)
Sent pt MyChart message to inform of Mammo order. Gave her [facility] phone # to call if she has not heard from their office in a week.

## 2023-04-07 NOTE — Telephone Encounter (Signed)
-----   Message from [name] sent at 04/01/2023  1:20 PM EDT ----- Regarding: Medical Clearance Ladies,  Ann Stone will need cardiac clearance prior to surgery- I missed her tachycardia on chart review. She will have general anesthesia and her procedure will last one hour.  She needs to be sent to her [facility] for a blood pressure check.  Lastly, she hasn't had a mammogram, or at least I can't find one, since 2018. Will you please check and see if she needs one ordered?  Thank you, Ree Kida

## 2023-04-08 ENCOUNTER — Telehealth: Payer: Self-pay | Admitting: Radiation Oncology

## 2023-04-08 NOTE — Telephone Encounter (Addendum)
Called patient to R/S missed consultation w. Dr. Mitzi Hansen. No answer, LVM for a return call.

## 2023-04-08 NOTE — Telephone Encounter (Signed)
Sent no show letter 9/25.

## 2023-04-15 NOTE — Progress Notes (Deleted)
Cardiology Office Note:    Date:  04/15/2023   ID:  Ann Stone, DOB 1965-07-28, MRN [id number]  PCP:  Mercie Eon, MD   [facility] Providers Cardiologist:  Maisie Fus, MD     Referring MD: Reymundo Poll, MD   No chief complaint on file.  "Tachycardia"  History of Present Illness:    Ann Stone is a 57 y.o. female with a hx of DM2 A1c 8.5%,  HTN,HLD, referral for palpitations  She states that ever since she can remember she has had a fast pulse. If she walks into the hospital, her pulse is high. She feels that she is used to it. 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 She says with her visits it is elevated. Denies LH or dizziness. No syncope.No DOE. Reports deconditioning. She denies chest pressure. She has GERD. She drinks coffee 1 cup per day.   She has hypertension and on lisinopril 20 mg and HCTz 12.5 mg. She is going to see a nutritionist for weight loss.  She has hx of fibroids and she had hysterectomy around 2002. No children. She has hot flashes, has not menstrual cycle in 20 years. Her ovaries are there. No HT  Seen in the ED 06/02/2021 for viral illness.   EKG 03/24/2017- NSR  Family Hx: Brother died of MI 86s, Aunt died of MI and her grandmother with MI.  Social Hx: Stopped smoking 6 yeas, 1 ppd for 18 years  Labs 05/29/2021 HDL 38 LDL 215 TC 283 A1c 9.4% TSH 1.6 Crt 0.6  Interim hx 04/15/2023 A monitor was ordered, not done. She returns for a pre-op for keloid removal. There's concern because she has hx of "tachycardia".    Past Medical History:  Diagnosis Date   Breast pain 09/08/2017   Depression    Diabetes (HCC)    GERD (gastroesophageal reflux disease)    HTN (hypertension)    Hyperlipemia    Leiomyoma of uterus    s/p hysterectomy, hx of uterine hemorrhage   Tobacco abuse    Vaginal mass     Past Surgical History:  Procedure Laterality Date   ABDOMINAL HYSTERECTOMY     OPERATIVE HYSTEROSCOPY       Current Medications: No outpatient medications have been marked as taking for the 04/16/23 encounter (Appointment) with Maisie Fus, MD.     Allergies:   Influenza vaccines, Metoprolol tartrate, and Penicillins   Social History   Socioeconomic History   Marital status: Single    Spouse name: Not on file   Number of children: Not on file   Years of education: Not on file   Highest education level: Not on file  Occupational History   Not on file  Tobacco Use   Smoking status: Former    Current packs/day: 0.00    Types: Cigarettes    Quit date: 08/02/2018    Years since quitting: 4.7   Smokeless tobacco: Never  Vaping Use   Vaping status: Never Used  Substance and Sexual Activity   Alcohol use: Yes    Comment: occasional   Drug use: Never   Sexual activity: Yes    Birth control/protection: Surgical  Other Topics Concern   Not on file  Social History Narrative   Not on file   Social Determinants of Health   Financial Resource Strain: Not on file  Food Insecurity: Not on file  Transportation Needs: Not on file  Physical Activity: Not on file  Stress: Not on file  Social Connections: Not on file     Family History: The patient's family history includes Cancer in her father and mother; Osteoarthritis in her mother.  ROS:   Please see the history of present illness.     All other systems reviewed and are negative.  EKGs/Labs/Other Studies Reviewed:    The following studies were reviewed today:   EKG:  EKG is  ordered today.  The ekg ordered today demonstrates   NSR, LVH     Recent Labs: 01/19/2023: BUN 16; Creatinine, Ser 0.96; Hemoglobin 15.6; Platelets 236; Potassium 3.9; Sodium 136; TSH 3.430    Recent Lipid Panel    Component Value Date/Time   CHOL 249 (H) 01/19/2023 1503   TRIG 284 (H) 01/19/2023 1503   HDL 53 01/19/2023 1503   CHOLHDL 4.7 (H) 01/19/2023 1503   CHOLHDL 5.2 10/29/2011 1522   VLDL 39 10/29/2011 1522   LDLCALC 144 (H)  01/19/2023 1503     Risk Assessment/Calculations:     The ASCVD Risk score (Arnett DK, et al., 2019) failed to calculate for the following reasons:   The systolic blood pressure is missing       Physical Exam:    VS:   ***   Wt Readings from Last 3 Encounters:  04/01/23 196 lb 12.8 oz (89.3 kg)  01/19/23 198 lb 1.6 oz (89.9 kg)  05/19/22 194 lb 8 oz (88.2 kg)     GEN:  Well nourished, well developed in no acute distress HEENT: Normal NECK: No JVD; No carotid bruits LYMPHATICS: No lymphadenopathy CARDIAC: RRR, no murmurs, rubs, gallops RESPIRATORY:  Clear to auscultation without rales, wheezing or rhonchi  ABDOMEN: Soft, non-tender, non-distended MUSCULOSKELETAL:  No edema; No deformity  SKIN: Warm and dry NEUROLOGIC:  Alert and oriented x 3 PSYCHIATRIC:  Normal affect   ASSESSMENT:    #Palpitations:  She reports high rates when going to her primary's office. Rates are normal here. She does not have high risk features including syncope c/f arrhythmia , family hx of SCD, or abnormalities on her EKG. A monitor was ordered, not done. She has not been diagnosed with an arrhythmia. if palpitations return we can consider a monitor at that time.  PreOP She is acceptable cardiac risk for low risk surgery She can proceed with low risk surgery  #HTN: good control. Recommend continuing lisinopril-Hctz. Goal < 130/80 mmHg.  #HLD: Discussed physical activity and weight loss. She is seeing a nutritionist. On Crestor 40. LDL 215 which is very high.  Added zetia. LDL 144 mg/DL  #DM2: 1.6% continue metformin  PLAN:    In order of problems listed above:  Ziopatch 7 days Zetia  Lipid profile in 8 weeks Follow up PRN     Medication Adjustments/Labs and Tests Ordered: Current medicines are reviewed at length with the patient today.  Concerns regarding medicines are outlined above.  No orders of the defined types were placed in this encounter.  No orders of the defined types  were placed in this encounter.   There are no Patient Instructions on file for this visit.   Signed, Maisie Fus, MD  04/15/2023 2:49 PM    [facility]

## 2023-04-16 ENCOUNTER — Ambulatory Visit: Payer: Managed Care, Other (non HMO) | Attending: Internal Medicine | Admitting: Internal Medicine

## 2023-04-21 ENCOUNTER — Telehealth: Payer: Self-pay | Admitting: Radiation Oncology

## 2023-04-21 NOTE — Telephone Encounter (Signed)
Unable to contact patient to r/s her consultation w. Dr. Mitzi Hansen. Closing referral until further notice, Dr. Lubertha Basque office notified.

## 2023-05-06 ENCOUNTER — Telehealth (HOSPITAL_BASED_OUTPATIENT_CLINIC_OR_DEPARTMENT_OTHER): Payer: Self-pay | Admitting: *Deleted

## 2023-05-06 NOTE — Telephone Encounter (Signed)
Left message the pt will need appt IN OFFICE FOR PRE OP CLEARANCE.

## 2023-05-06 NOTE — Telephone Encounter (Signed)
   Pre-operative Risk Assessment    Patient Name: Ann Stone  DOB: Aug 07, 1965 MRN: [id number]      Request for Surgical Clearance    Procedure:   Left Ear Keloid Removal  Date of Surgery:  Clearance TBD                                 Surgeon:  Dr. Weyman Croon Surgeon's Group or Practice Name:  [facility] Phone number:  [phone] Fax number:  [phone]   Type of Clearance Requested:   - Medical    Type of Anesthesia:  General    Additional requests/questions:    Signed, Emmit Pomfret   05/06/2023, 8:42 AM

## 2023-05-06 NOTE — Telephone Encounter (Signed)
    Primary Cardiologist:Branch, Alben Spittle, MD  Chart reviewed as part of pre-operative protocol coverage. Because of Ann Stone's past medical history and time since last visit, he/she will require a follow-up visit in order to better assess preoperative cardiovascular risk.  Pre-op covering staff: - Please schedule in office  appointment and call patient to inform them. - Please contact requesting surgeon's office via preferred method (i.e, phone, fax) to inform them of need for appointment prior to surgery.  If applicable, this message will also be routed to pharmacy pool and/or primary cardiologist for input on holding anticoagulant/antiplatelet agent as requested below so that this information is available at time of patient's appointment.   Ronney Asters, NP  05/06/2023, 9:41 AM

## 2023-05-07 NOTE — Telephone Encounter (Signed)
Will update the requesting office that the pt needs an appt in office before she can be cleared. Our office just received the request yesterday for surgery to be done io 05/12/23. We left a message yesterday for the pt to call back to schedule in office appt and she has not called back yet. Her procedure may need to possibly be post poned until she has been seen by cardiology.

## 2023-05-11 ENCOUNTER — Encounter: Payer: Commercial Managed Care - HMO | Admitting: Student

## 2023-05-11 NOTE — Telephone Encounter (Signed)
I will update the surgeon's office that we have tried to reach the pt to schedule an appt IN OFFICE FOR PRE OP CLEARANCE. Pt has not called back we have left messages x 2.   May need to post pone surgery until she has been cleared by the cardiologist. Per pre op APP pt needs IN OFFICE APPT FOR PRE OP CLEARANCE.

## 2023-05-11 NOTE — Telephone Encounter (Signed)
Can we find out what the issue is with her pre op clearance please? She has an unexplained tachycardia and uncontrolled hypertension. Her surgery will have to wait until these issues are addressed.

## 2023-05-11 NOTE — Telephone Encounter (Signed)
Pt needs to call the office to schedule an IN OFFICE APPT PER PRE OP APP. We have left messages x 2 for pt to call back.

## 2023-05-12 ENCOUNTER — Telehealth: Payer: Self-pay | Admitting: Internal Medicine

## 2023-05-12 ENCOUNTER — Encounter: Payer: Commercial Managed Care - HMO | Admitting: Student

## 2023-05-12 NOTE — Telephone Encounter (Signed)
Patient was contacted at the request of Dr. Mercie Eon to schedule an appointment.  No answer, but left detailed message asking to return my phone call.

## 2023-05-12 NOTE — Telephone Encounter (Signed)
Cardia clearance was faxed to patient's Cardiologist, Ann Clines, MD on 04/01/23 and 05/05/23. We have not received anything back from their office. [name] will call office today to check.

## 2023-05-14 NOTE — Telephone Encounter (Signed)
LVM for pt to call office to schedule pre op in person visit.

## 2023-05-15 NOTE — Telephone Encounter (Signed)
I will update requesting office we have attempted x 3 to reach the to schedule appt in office.

## 2023-05-20 ENCOUNTER — Encounter: Payer: Managed Care, Other (non HMO) | Admitting: Student

## 2023-05-22 ENCOUNTER — Telehealth: Payer: Self-pay | Admitting: *Deleted

## 2023-05-22 NOTE — Telephone Encounter (Signed)
LVM that surgery was cancelled due to patient no show/no call for 2 preops and not calling us back this week to reschedule for 3rd time. If patient calls back to schedule we need to check with Dr. Ladona Ridgel first.

## 2023-06-05 ENCOUNTER — Encounter (HOSPITAL_BASED_OUTPATIENT_CLINIC_OR_DEPARTMENT_OTHER): Payer: Self-pay

## 2023-06-05 ENCOUNTER — Ambulatory Visit (HOSPITAL_BASED_OUTPATIENT_CLINIC_OR_DEPARTMENT_OTHER): Admit: 2023-06-05 | Payer: No Typology Code available for payment source | Admitting: Plastic Surgery

## 2023-06-05 SURGERY — CYST REMOVAL
Anesthesia: Choice | Laterality: Left

## 2023-06-10 ENCOUNTER — Other Ambulatory Visit: Payer: Self-pay

## 2023-06-10 ENCOUNTER — Encounter: Payer: Managed Care, Other (non HMO) | Admitting: Plastic Surgery

## 2023-06-10 MED ORDER — FLUOXETINE HCL 20 MG PO CAPS: 60.0000 mg | ORAL_CAPSULE | Freq: Every day | ORAL | 0 refills | Status: DC

## 2023-06-24 ENCOUNTER — Encounter: Payer: Managed Care, Other (non HMO) | Admitting: Student

## 2023-07-09 ENCOUNTER — Encounter: Payer: Managed Care, Other (non HMO) | Admitting: Student

## 2023-07-17 ENCOUNTER — Telehealth: Payer: Self-pay | Admitting: *Deleted

## 2023-07-17 ENCOUNTER — Other Ambulatory Visit: Payer: Self-pay | Admitting: Internal Medicine

## 2023-07-17 DIAGNOSIS — I10 Essential (primary) hypertension: Secondary | ICD-10-CM

## 2023-07-17 NOTE — Telephone Encounter (Signed)
 Call from patient's sister Gaylan states that patient's husband died on 07-07-2023.  Patient is extremely upset.  Is on Prozac  which she is taking.  Does not help.  Would like to see if something can be ordered to help her through this Crisis.  Medication can be sent to the [facility] on  [address].

## 2023-07-20 ENCOUNTER — Encounter: Payer: Commercial Managed Care - HMO | Admitting: Internal Medicine

## 2023-07-20 ENCOUNTER — Encounter: Payer: Self-pay | Admitting: Internal Medicine

## 2023-07-20 NOTE — Progress Notes (Deleted)
   CC: follow up  HPI:  Ms.OMESHA BOWERMAN is a 58 y.o. with medical history of HTN, HLD, DMII,  GAD, and tobacco use disorder presenting to [facility] for a follow up appointment. Last seen by our clinic in 01/2023 and was to return in 3 months.   Please see problem-based list for further details, assessments, and plans.  Past Medical History:  Diagnosis Date   Breast pain 09/08/2017   Depression    Diabetes (HCC)    GERD (gastroesophageal reflux disease)    Hair loss 05/30/2021   HTN (hypertension)    Hyperlipemia    Keloid 05/20/2022   Leiomyoma of uterus    s/p hysterectomy, hx of uterine hemorrhage   Sebaceous cyst 10/08/2020   Tobacco abuse    Vaginal mass     Current Outpatient Medications (Endocrine & Metabolic):    metFORMIN  (GLUCOPHAGE ) 500 MG tablet, Take 2 tablets (1,000 mg total) by mouth daily with breakfast.  Current Outpatient Medications (Cardiovascular):    ezetimibe  (ZETIA ) 10 MG tablet, Take 1 tablet (10 mg total) by mouth daily.   lisinopril -hydrochlorothiazide  (ZESTORETIC ) 20-12.5 MG tablet, Take 2 tablets by mouth once daily   rosuvastatin  (CRESTOR ) 40 MG tablet, Take 1 tablet (40 mg total) by mouth daily.     Current Outpatient Medications (Other):    FLUoxetine  (PROZAC ) 20 MG capsule, Take 3 capsules (60 mg total) by mouth daily.  Review of Systems:  Review of system negative unless stated in the problem list or HPI.    Physical Exam:  There were no vitals filed for this visit. Physical Exam General: NAD HENT: NCAT Lungs: CTAB, no wheeze, rhonchi or rales.  Cardiovascular: Normal heart sounds, no r/m/g, 2+ pulses in all extremities. No LE edema Abdomen: No TTP, normal bowel sounds MSK: No asymmetry or muscle atrophy.  Skin: no lesions noted on exposed skin Neuro: Alert and oriented x4. CN grossly intact Psych: Normal mood and normal affect   Assessment & Plan:   No problem-specific Assessment & Plan notes found for this encounter.   See  Encounters Tab for problem based charting.  Patient Discussed with Dr. {[id number]::Hlpoonli,Ynqqfjw,Floozw,Wjmzwimj,Cpwrzwu,Fjryzw,Ojl,Yjuryzm,Tpoopjfd} Morene Bathe, MD Jolynn DEL. [facility] Medicine Res[facility], PGY-3   HTN On Lisinopril -hydrochlorothiazide  20-12.5 mg every day. Normal renal function this year.   HLD LDL 144. On Crestor  40 mg every day.   T2DM: ON metformin  1000 mg BID. A1c 6 months ago 5.9  HM Flu shot, foot exam, ACR, eye exam, cologuard

## 2023-08-04 ENCOUNTER — Encounter: Payer: Self-pay | Admitting: Student

## 2023-08-04 ENCOUNTER — Other Ambulatory Visit: Payer: Self-pay

## 2023-08-04 ENCOUNTER — Ambulatory Visit: Payer: Commercial Managed Care - HMO | Admitting: Student

## 2023-08-04 VITALS — BP 160/86 | HR 92 | Temp 98.5°F | Ht 64.0 in | Wt 199.2 lb

## 2023-08-04 DIAGNOSIS — F331 Major depressive disorder, recurrent, moderate: Secondary | ICD-10-CM | POA: Diagnosis not present

## 2023-08-04 DIAGNOSIS — E119 Type 2 diabetes mellitus without complications: Secondary | ICD-10-CM

## 2023-08-04 DIAGNOSIS — I10 Essential (primary) hypertension: Secondary | ICD-10-CM | POA: Diagnosis not present

## 2023-08-04 LAB — POCT GLYCOSYLATED HEMOGLOBIN (HGB A1C): Hemoglobin A1C: 6.1 % — AB (ref 4.0–5.6)

## 2023-08-04 LAB — GLUCOSE, CAPILLARY: Glucose-Capillary: 117 mg/dL — ABNORMAL HIGH (ref 70–99)

## 2023-08-04 MED ORDER — SERTRALINE HCL 50 MG PO TABS: 50.0000 mg | ORAL_TABLET | Freq: Every day | ORAL | 1 refills | Status: DC

## 2023-08-04 MED ORDER — SEMAGLUTIDE(0.25 OR 0.5MG/DOS) 2 MG/3ML ~~LOC~~ SOPN: 0.2500 mg | PEN_INJECTOR | SUBCUTANEOUS | 1 refills | Status: DC

## 2023-08-04 MED ORDER — LISINOPRIL-HYDROCHLOROTHIAZIDE 20-12.5 MG PO TABS: 2.0000 | ORAL_TABLET | Freq: Every day | ORAL | 1 refills | Status: DC

## 2023-08-04 NOTE — Progress Notes (Signed)
Established Patient Office Visit  Subjective   Patient ID: Ann Stone, female    DOB: [birth year]  Age: 58 y.o. MRN: [id number]  Chief Complaint  Patient presents with   Follow-up    6 month  no acute problems    Diabetes   Hypertension   Medication Refill    Ann Stone is a 58 y.o. who presents to the clinic for a six month follow up of HTN and T2DM. Please see problem based assessment and plan for additional details.  Patient Active Problem List   Diagnosis Date Noted   Blurry vision, bilateral 01/20/2023   Generalized anxiety disorder 03/26/2022   Gout 02/26/2022   Diabetes (HCC) 05/29/2021   Vaginal mass 04/25/2021   Sinus tachycardia 10/16/2020   Depression 07/22/2010   Hyperlipidemia 08/02/2008   Tobacco use disorder, moderate, in sustained remission 06/29/2006   Hypertension, essential 06/29/2006      Objective:     BP (!) 160/86 (BP Location: Left Arm, Cuff Size: Large)   Pulse 92   Temp 98.5 F (36.9 C) (Oral)   Ht 5\' 4"  (1.626 m)   Wt 199 lb 3.2 oz (90.4 kg)   SpO2 100%   BMI 34.19 kg/m  BP Readings from Last 3 Encounters:  08/04/23 (!) 160/86  04/01/23 (!) 176/124  01/19/23 117/75   Wt Readings from Last 3 Encounters:  08/04/23 199 lb 3.2 oz (90.4 kg)  04/01/23 196 lb 12.8 oz (89.3 kg)  01/19/23 198 lb 1.6 oz (89.9 kg)      Physical Exam Vitals reviewed.  Cardiovascular:     Rate and Rhythm: Normal rate and regular rhythm.     Heart sounds: No murmur heard. Pulmonary:     Effort: Pulmonary effort is normal. No respiratory distress.     Breath sounds: Normal breath sounds. No stridor. No wheezing, rhonchi or rales.  Skin:    General: Skin is warm and dry.  Neurological:     Mental Status: She is alert.  Psychiatric:        Mood and Affect: Mood is not depressed. Affect is tearful.      Results for orders placed or performed in visit on 08/04/23  Glucose, capillary  Result Value Ref Range   Glucose-Capillary 117 (H) 70 - 99  mg/dL  POC Hbg O9G  Result Value Ref Range   Hemoglobin A1C 6.1 (A) 4.0 - 5.6 %   HbA1c POC (<> result, manual entry)     HbA1c, POC (prediabetic range)     HbA1c, POC (controlled diabetic range)      Last metabolic panel Lab Results  Component Value Date   GLUCOSE 110 (H) 01/19/2023   NA 136 01/19/2023   K 3.9 01/19/2023   CL 90 (L) 01/19/2023   CO2 27 01/19/2023   BUN 16 01/19/2023   CREATININE 0.96 01/19/2023   EGFR 70 01/19/2023   CALCIUM 9.9 01/19/2023   PROT 7.5 06/21/2018   ALBUMIN 4.2 06/21/2018   LABGLOB 3.3 06/21/2018   AGRATIO 1.3 06/21/2018   BILITOT 0.3 06/21/2018   ALKPHOS 68 06/21/2018   AST 14 06/21/2018   ALT 14 06/21/2018   Last hemoglobin A1c Lab Results  Component Value Date   HGBA1C 6.1 (A) 08/04/2023      The 10-year ASCVD risk score (Arnett DK, et al., 2019) is: 28.7%    Assessment & Plan:   Problem List Items Addressed This Visit       Cardiovascular and Mediastinum  Hypertension, essential (Chronic)   Patient presents to clinic with uncontrolled hypertension.  Her blood pressure today was 167/89 and rechecked at 160/86.  She reports compliance with her regimen of lisinopril-hydrochlorothiazide 20-12.5 twice daily.  At home she checks her blood pressure once a day in the morning.  She stated that her average blood pressure is 98/80; however, her blood pressure does increase some mornings up to 170/120s.  With the patient having fluctuations in blood pressure readings at home, I am hesitant to increase her regimen and cause hypotension. Plan: -Will have patient take regimen of lisinopril hydrochlorothiazide 20-12.5, 2 pills once a day. -Patient was instructed to check her blood pressure after taking her blood pressure medication and to keep a log  -Follow-up in 1 month -BMP today      Relevant Medications   lisinopril-hydrochlorothiazide (ZESTORETIC) 20-12.5 MG tablet   Other Relevant Orders   BMP8+Anion Gap     Endocrine   Diabetes  (HCC) - Primary (Chronic)   Patient presents with a history of well-controlled type 2 diabetes mellitus.  Her A1c was 5.9 in July and did increase to 6.1 today.  She was on a regimen of metformin 1000 mg daily.  She reports being compliant with this regimen for years until last month when she experienced diarrhea.  Her diarrhea stopped after stopping metformin.  Due to her adverse effect of metformin, will begin other medication time. Plan -Will start Ozempic 0.25 mg weekly and have patient increase to 0.5 mg after 4 weeks. -Patient was educated on side effects such as nausea and vomiting      Relevant Medications   lisinopril-hydrochlorothiazide (ZESTORETIC) 20-12.5 MG tablet   Semaglutide,0.25 or 0.5MG /DOS, 2 MG/3ML SOPN   Other Relevant Orders   POC Hbg A1C (Completed)   Glucose, capillary (Completed)   Microalbumin / Creatinine Urine Ratio     Other   Depression (Chronic)   Patient presents to clinic today with worsening of MDD.  Per patient, she has been taking 60 mg of Prozac daily which has not helped with her depression.  She stated that Prozac did not help her in the past.  I do wonder if this is worsening MDD versus a grief reaction due to her husband passing away 1 month ago.  With her underlying history of MDD with a moderate score of 18 on the PHQ-9, we discussed treatment options at this time.  Patient declined therapy. She denied SI today.  Plan -Will transition patient to Zoloft 50 mg daily since prozac is not helping and has not improved her mood in the past (per pt) -I do suspect that patient is having a reaction of grief due to her husband's death, I encouraged her to lean on her support system and talk with family members since she does not wish to pursue therapy at this time.       Relevant Medications   sertraline (ZOLOFT) 50 MG tablet    Return in about 4 weeks (around 09/01/2023) for MDD, BP.    Faith Rogue, DO

## 2023-08-04 NOTE — Assessment & Plan Note (Signed)
Patient presents to clinic today with worsening of MDD.  Per patient, she has been taking 60 mg of Prozac daily which has not helped with her depression.  She stated that Prozac did not help her in the past.  I do wonder if this is worsening MDD versus a grief reaction due to her husband passing away 1 month ago.  With her underlying history of MDD with a moderate score of 18 on the PHQ-9, we discussed treatment options at this time.  Patient declined therapy. She denied SI today.  Plan -Will transition patient to Zoloft 50 mg daily since prozac is not helping and has not improved her mood in the past (per pt) -I do suspect that patient is having a reaction of grief due to her husband's death, I encouraged her to lean on her support system and talk with family members since she does not wish to pursue therapy at this time.

## 2023-08-04 NOTE — Assessment & Plan Note (Signed)
Patient presents with a history of well-controlled type 2 diabetes mellitus.  Her A1c was 5.9 in July and did increase to 6.1 today.  She was on a regimen of metformin 1000 mg daily.  She reports being compliant with this regimen for years until last month when she experienced diarrhea.  Her diarrhea stopped after stopping metformin.  Due to her adverse effect of metformin, will begin other medication time. Plan -Will start Ozempic 0.25 mg weekly and have patient increase to 0.5 mg after 4 weeks. -Patient was educated on side effects such as nausea and vomiting

## 2023-08-04 NOTE — Patient Instructions (Signed)
Thank you, Ms.Dionisio David Sundberg for allowing [place] to provide your care today. Today we discussed blood pressure, depression, and diabetes.    I have ordered the following labs for you:   Lab Orders         Glucose, capillary         BMP8+Anion Gap         Microalbumin / Creatinine Urine Ratio         POC Hbg A1C        Referrals ordered today:   Referral Orders  No referral(s) requested today     I have ordered the following medication/changed the following medications:   Stop the following medications: Medications Discontinued During This Encounter  Medication Reason   lisinopril-hydrochlorothiazide (ZESTORETIC) 20-12.5 MG tablet Change in therapy   FLUoxetine (PROZAC) 20 MG capsule Change in therapy     Start the following medications: Meds ordered this encounter  Medications   sertraline (ZOLOFT) 50 MG tablet    Sig: Take 1 tablet (50 mg total) by mouth daily.    Dispense:  30 tablet    Refill:  1   lisinopril-hydrochlorothiazide (ZESTORETIC) 20-12.5 MG tablet    Sig: Take 2 tablets by mouth daily.    Dispense:  60 tablet    Refill:  1   Semaglutide,0.25 or 0.5MG /DOS, 2 MG/3ML SOPN    Sig: Inject 0.25 mg into the skin once a week. For total of 4 weeks.  After 4 weeks, please increase the dose to 0.5 mg.    Dispense:  3 mL    Refill:  1     Follow up: 1 month     Remember: To take your blood pressure after taking your medications and to bring your cuff in for your next appointment.   Should you have any questions or concerns please call the internal medicine clinic at [phone].     Faith Rogue, D.O. [facility]

## 2023-08-04 NOTE — Assessment & Plan Note (Signed)
Patient presents to clinic with uncontrolled hypertension.  Her blood pressure today was 167/89 and rechecked at 160/86.  She reports compliance with her regimen of lisinopril-hydrochlorothiazide 20-12.5 twice daily.  At home she checks her blood pressure once a day in the morning.  She stated that her average blood pressure is 98/80; however, her blood pressure does increase some mornings up to 170/120s.  With the patient having fluctuations in blood pressure readings at home, I am hesitant to increase her regimen and cause hypotension. Plan: -Will have patient take regimen of lisinopril hydrochlorothiazide 20-12.5, 2 pills once a day. -Patient was instructed to check her blood pressure after taking her blood pressure medication and to keep a log  -Follow-up in 1 month -BMP today

## 2023-08-05 LAB — MICROALBUMIN / CREATININE URINE RATIO
Creatinine, Urine: 47.9 mg/dL
Microalb/Creat Ratio: <6 mg/g{creat} (ref 0–29)
Microalbumin, Urine: <3 ug/mL

## 2023-08-05 LAB — BMP8+ANION GAP
Anion Gap: 19 mmol/L — ABNORMAL HIGH (ref 10.0–18.0)
BUN/Creatinine Ratio: 24 — ABNORMAL HIGH (ref 9–23)
BUN: 24 mg/dL (ref 6–24)
CO2: 23 mmol/L (ref 20–29)
Calcium: 10.1 mg/dL (ref 8.7–10.2)
Chloride: 96 mmol/L (ref 96–106)
Creatinine, Ser: 1.01 mg/dL — ABNORMAL HIGH (ref 0.57–1.00)
Glucose: 100 mg/dL — ABNORMAL HIGH (ref 70–99)
Potassium: 5 mmol/L (ref 3.5–5.2)
Sodium: 138 mmol/L (ref 134–144)
eGFR: 66 mL/min/{1.73_m2} (ref >59)

## 2023-08-05 NOTE — Progress Notes (Signed)
 Internal Medicine Clinic Attending  Case discussed with the resident at the time of the visit.  We reviewed the resident's history and exam and pertinent patient test results.  I agree with the assessment, diagnosis, and plan of care documented in the resident's note.

## 2023-08-10 ENCOUNTER — Telehealth: Payer: Self-pay

## 2023-08-10 NOTE — Telephone Encounter (Signed)
Prior Authorization for patient (Ozempic (0.25 or 0.5 MG/DOSE) 2MG /3ML pen-injectors) came through on cover my meds was submitted with last office notes and labs awaiting approval or denial.  XBJ:[id number]

## 2023-08-10 NOTE — Telephone Encounter (Signed)
Decision:Approved  Ann Stone (Key: [id number]) Rx #: [id number] Ozempic (0.25 or 0.5 MG/DOSE) 2MG Ann Stone pen-injectors Form Passenger transport manager PA Form (2017 NCPDP) Created Message from Plan CaseId:[id number];Status:Approved;Review Type:Prior Auth;Coverage Start Date:08/10/2023;Coverage End Date:08/09/2024;. Authorization Expiration Date: August 09, 2024.

## 2023-08-11 ENCOUNTER — Telehealth: Payer: Self-pay

## 2023-08-11 ENCOUNTER — Telehealth: Payer: Self-pay | Admitting: *Deleted

## 2023-08-11 MED ORDER — TRULICITY 1.5 MG/0.5ML ~~LOC~~ SOAJ
1.5000 mg | SUBCUTANEOUS | 0 refills | Status: AC
Start: 2023-09-03 — End: 2023-09-25

## 2023-08-11 MED ORDER — TRULICITY 0.75 MG/0.5ML ~~LOC~~ SOAJ
0.7500 mg | SUBCUTANEOUS | 0 refills | Status: AC
Start: 2023-08-11 — End: 2023-09-02

## 2023-08-11 NOTE — Telephone Encounter (Signed)
RTC from patient her Insurance will cover Trulicity for her.  Will need a prescription sent to the [facility].

## 2023-08-11 NOTE — Telephone Encounter (Signed)
RTC to patient about herr Ozempic.  Spoke to patient who said that her Insurance will only cover $100.00 and she will need to pay $900.00 for her Ozempic.  Was told that Rosann Auerbach would cover something starting with a T.  Unsure if if was Tirzipitide which is Mounjaro.  Will contact her insurance and call the clinics with the medication that will be covered.

## 2023-08-11 NOTE — Telephone Encounter (Signed)
Decision:Approved  Epifanio Lesches (Key: [id number]) PA Case ID #: [id number] Rx #: [id number] Need Help? Call us at [phone] Outcome Approved today by [facility] 2017 CaseId:[id number];Status:Approved;Review Type:Prior Auth;Coverage Start Date:08/11/2023;Coverage End Date:08/10/2024; Authorization Expiration Date: 08/09/2024 Drug Trulicity 1.5MG /0.5ML auto-injectors ePA cloud Pension scheme manager PA Form [phone] NCPDP) Original Claim Info 75

## 2023-08-11 NOTE — Telephone Encounter (Signed)
Prior Authorization for patient (Trulicity 1.5MG /0.5ML auto-injectors) came through on cover my meds was submitted with last office notes and labs awaiting approval or denial.  [id number]

## 2023-08-11 NOTE — Telephone Encounter (Signed)
Please call patient regarding cost of Ozempic at [phone].

## 2023-08-11 NOTE — Telephone Encounter (Signed)
Call to patient informed her that Dr. Lafonda Mosses has called in the Trulicity for her.  She will need to take 0.75mg  weekly for 4 weeks.  Then 1.5 mg weekly for 4 weeks and then call when she has almost completed the 1.5  mg for a refill in which her dose may be increased.  Advised patient to call the Clinics for questions.

## 2023-08-18 ENCOUNTER — Telehealth: Payer: Self-pay

## 2023-08-18 NOTE — Telephone Encounter (Signed)
 Decision:Approved  Ann Stone (Key: [id number]) PA Case ID #: [id number] Rx #: [id number] Need Help? Call us  at [phone] Outcome Approved today by [facility] 2017 CaseId:[id number];Status:Approved;Review Type:Prior Auth;Coverage Start Date:08/18/2023;Coverage End Date:08/17/2024; Authorization Expiration Date: 08/16/2024 Drug Trulicity  0.75MG /0.5ML auto-injectors ePA cloud logo Form [employer] Electronic PA Form [phone] NCPDP) Original Claim Info 75

## 2023-08-18 NOTE — Telephone Encounter (Signed)
Prior Authorization for patient (Trulicity 0.75MG /0.5ML auto-injectors) came through on cover my meds was submitted with last office notes and labs awaiting approval or denial.  KEY:[id number]

## 2023-08-31 ENCOUNTER — Encounter: Payer: Commercial Managed Care - HMO | Admitting: Internal Medicine

## 2023-09-07 ENCOUNTER — Encounter: Payer: Commercial Managed Care - HMO | Admitting: Internal Medicine

## 2023-09-07 NOTE — Progress Notes (Deleted)
 CC: ***  HPI:  Ms.TYNISA VOHS is a 58 y.o. female living with a history stated below and presents today for ***. Please see problem based assessment and plan for additional details.  Past Medical History:  Diagnosis Date   Breast pain 09/08/2017   Depression    Diabetes (HCC)    GERD (gastroesophageal reflux disease)    Hair loss 05/30/2021   HTN (hypertension)    Hyperlipemia    Keloid 05/20/2022   Leiomyoma of uterus    s/p hysterectomy, hx of uterine hemorrhage   Sebaceous cyst 10/08/2020   Tobacco abuse    Vaginal mass     Current Outpatient Medications on File Prior to Visit  Medication Sig Dispense Refill   Dulaglutide (TRULICITY) 1.5 MG/0.5ML SOAJ Inject 1.5 mg into the skin once a week for 4 doses. 2 mL 0   ezetimibe (ZETIA) 10 MG tablet Take 1 tablet (10 mg total) by mouth daily. 90 tablet 3   lisinopril-hydrochlorothiazide (ZESTORETIC) 20-12.5 MG tablet Take 2 tablets by mouth daily. 60 tablet 1   rosuvastatin (CRESTOR) 40 MG tablet Take 1 tablet (40 mg total) by mouth daily. 30 tablet 11   sertraline (ZOLOFT) 50 MG tablet Take 1 tablet (50 mg total) by mouth daily. 30 tablet 1   No current facility-administered medications on file prior to visit.    Family History  Problem Relation Age of Onset   Cancer Mother    Osteoarthritis Mother    Cancer Father     Social History   Socioeconomic History   Marital status: Single    Spouse name: Not on file   Number of children: Not on file   Years of education: Not on file   Highest education level: Not on file  Occupational History   Not on file  Tobacco Use   Smoking status: Former    Current packs/day: 0.00    Types: Cigarettes    Quit date: 08/02/2018    Years since quitting: 5.1   Smokeless tobacco: Never  Vaping Use   Vaping status: Never Used  Substance and Sexual Activity   Alcohol use: Yes    Comment: occasional   Drug use: Never   Sexual activity: Yes    Birth control/protection:  Surgical  Other Topics Concern   Not on file  Social History Narrative   Not on file   Social Drivers of Health   Financial Resource Strain: Not on file  Food Insecurity: Not on file  Transportation Needs: Not on file  Physical Activity: Not on file  Stress: Not on file  Social Connections: Not on file  Intimate Partner Violence: Not on file    Review of Systems: ROS negative except for what is noted on the assessment and plan.  There were no vitals filed for this visit.  Physical Exam: Constitutional: well-appearing *** sitting in ***, in no acute distress HENT: normocephalic atraumatic, mucous membranes moist Eyes: conjunctiva non-erythematous Cardiovascular: regular rate and rhythm, no m/r/g Pulmonary/Chest: normal work of breathing on room air, lungs clear to auscultation bilaterally Abdominal: soft, non-tender, non-distended MSK: normal bulk and tone Neurological: alert & oriented x 3, no focal deficit Skin: warm and dry Psych: normal mood and behavior  Assessment & Plan:   HTN: - on lisinopril-hydrochlorothiazide 20-12.5 mg 2 pills  DM: - A1c 6.1 - ozempic 0.25 - increase to 0.5? - foot exam - ophtho   HCM: - flu  -   Patient {GC/GE:[id number]::"discussed with","seen with"} Dr. {UEAVW:[id number]::"YNWGNFAO","Z.  Hoffman","Mullen","Narendra","Vincent","Guilloud","Lau","Machen"}  No problem-specific Assessment & Plan notes found for this encounter.   Elza Rafter, D.O. [facility], PGY-3 Phone: [phone] Date 09/07/2023 Time 8:17 AM

## 2023-09-09 ENCOUNTER — Telehealth: Payer: Self-pay

## 2023-09-09 ENCOUNTER — Ambulatory Visit: Payer: Commercial Managed Care - HMO | Admitting: Internal Medicine

## 2023-09-09 VITALS — BP 142/81 | HR 96 | Temp 98.3°F | Ht 64.0 in | Wt 209.3 lb

## 2023-09-09 DIAGNOSIS — Z1211 Encounter for screening for malignant neoplasm of colon: Secondary | ICD-10-CM

## 2023-09-09 DIAGNOSIS — Z794 Long term (current) use of insulin: Secondary | ICD-10-CM | POA: Diagnosis not present

## 2023-09-09 DIAGNOSIS — E119 Type 2 diabetes mellitus without complications: Secondary | ICD-10-CM

## 2023-09-09 DIAGNOSIS — I10 Essential (primary) hypertension: Secondary | ICD-10-CM

## 2023-09-09 DIAGNOSIS — Z0000 Encounter for general adult medical examination without abnormal findings: Secondary | ICD-10-CM | POA: Insufficient documentation

## 2023-09-09 MED ORDER — TRULICITY 3 MG/0.5ML ~~LOC~~ SOAJ: 3.0000 mg | SUBCUTANEOUS | 1 refills | Status: DC

## 2023-09-09 MED ORDER — AMLODIPINE BESYLATE 5 MG PO TABS: 5.0000 mg | ORAL_TABLET | Freq: Every day | ORAL | 3 refills | Status: DC

## 2023-09-09 NOTE — Assessment & Plan Note (Signed)
 The patient presents to the clinic for a 1 month follow-up of her hypertension.  At the last office visit her systolic was in the 160s, but her lisinopril-HCTZ 40-25 mg daily was not adjusted, as the patient had reported some hypotensive blood pressures at home.  Today, her blood pressure is 145/86 and 142/81 upon recheck.  She states that she does not check her blood pressure every day at home, but when she does, the systolic is not less than 140.  She denies any headaches, dizziness, chest pain, or shortness of breath.  Plan: - Continue lisinopril-HCTZ 40-25 mg daily - Start amlodipine 5 mg daily - Follow-up in 1 month

## 2023-09-09 NOTE — Assessment & Plan Note (Signed)
-   Cologuard ordered - Patient given phone number to schedule mammogram

## 2023-09-09 NOTE — Telephone Encounter (Signed)
 Ann Stone (Ann Stone) PA Case ID #: [id number] Rx #: [id number] Need Help? Call us at [phone] Outcome Approved today by [facility] 2017 NWGNFA:[id number];QIONGE:[id number];Review Type:Prior Auth;Coverage Start Date:09/09/2023;Coverage End Date:09/08/2024;;CaseId:[id number];Status:Approved;Review Type:Qty;Coverage Start Date:08/26/2023;Coverage End Date:10/07/2023; Effective Date: 09/08/2023 Authorization Expiration Date: 09/07/2024 Drug Trulicity 3MG /0.5ML auto-injectors ePA cloud Pension scheme manager PA Form [phone] NCPDP) Original Claim Info 75

## 2023-09-09 NOTE — Progress Notes (Signed)
 CC: HTN  HPI:  Ann Stone is a 58 y.o. female living with a history stated below and presents today for a 1 month follow up of her hypertension. Please see problem based assessment and plan for additional details.  Past Medical History:  Diagnosis Date   Breast pain 09/08/2017   Depression    Diabetes (HCC)    GERD (gastroesophageal reflux disease)    Hair loss 05/30/2021   HTN (hypertension)    Hyperlipemia    Keloid 05/20/2022   Leiomyoma of uterus    s/p hysterectomy, hx of uterine hemorrhage   Sebaceous cyst 10/08/2020   Tobacco abuse    Vaginal mass     Current Outpatient Medications on File Prior to Visit  Medication Sig Dispense Refill   Dulaglutide (TRULICITY) 1.5 MG/0.5ML SOAJ Inject 1.5 mg into the skin once a week for 4 doses. 2 mL 0   ezetimibe (ZETIA) 10 MG tablet Take 1 tablet (10 mg total) by mouth daily. 90 tablet 3   lisinopril-hydrochlorothiazide (ZESTORETIC) 20-12.5 MG tablet Take 2 tablets by mouth daily. 60 tablet 1   rosuvastatin (CRESTOR) 40 MG tablet Take 1 tablet (40 mg total) by mouth daily. 30 tablet 11   sertraline (ZOLOFT) 50 MG tablet Take 1 tablet (50 mg total) by mouth daily. 30 tablet 1   No current facility-administered medications on file prior to visit.    Family History  Problem Relation Age of Onset   Cancer Mother    Osteoarthritis Mother    Cancer Father     Social History   Socioeconomic History   Marital status: Single    Spouse name: Not on file   Number of children: Not on file   Years of education: Not on file   Highest education level: Not on file  Occupational History   Not on file  Tobacco Use   Smoking status: Former    Current packs/day: 0.00    Types: Cigarettes    Quit date: 08/02/2018    Years since quitting: 5.1   Smokeless tobacco: Never  Vaping Use   Vaping status: Never Used  Substance and Sexual Activity   Alcohol use: Yes    Comment: occasional   Drug use: Never   Sexual activity: Yes     Birth control/protection: Surgical  Other Topics Concern   Not on file  Social History Narrative   Not on file   Social Drivers of Health   Financial Resource Strain: Not on file  Food Insecurity: Not on file  Transportation Needs: Not on file  Physical Activity: Not on file  Stress: Not on file  Social Connections: Not on file  Intimate Partner Violence: Not on file    Review of Systems: ROS negative except for what is noted on the assessment and plan.  Vitals:   09/09/23 1335 09/09/23 1353  BP: (!) 145/86 (!) 142/81  Pulse:  96  Temp: 98.3 F (36.8 C)   TempSrc: Oral   SpO2: 100%   Weight: 209 lb 4.8 oz (94.9 kg)   Height: 5\' 4"  (1.626 m)     Physical Exam: Constitutional: appears well, NAD HENT: large keloid on left ear helical rim  Cardiovascular: regular rate and rhythm, no m/r/g Pulmonary/Chest: normal work of breathing on room air MSK: normal bulk and tone Neurological: alert & oriented x 3, no focal deficit Skin: warm and dry Psych: normal mood and behavior  Assessment & Plan:   Patient discussed with Dr. Sol Blazing  Hypertension, essential The patient presents to the clinic for a 1 month follow-up of her hypertension.  At the last office visit her systolic was in the 160s, but her lisinopril-HCTZ 40-25 mg daily was not adjusted, as the patient had reported some hypotensive blood pressures at home.  Today, her blood pressure is 145/86 and 142/81 upon recheck.  She states that she does not check her blood pressure every day at home, but when she does, the systolic is not less than 140.  She denies any headaches, dizziness, chest pain, or shortness of breath.  Plan: - Continue lisinopril-HCTZ 40-25 mg daily - Start amlodipine 5 mg daily - Follow-up in 1 month  Diabetes (HCC) A1c 6.1% at her last office visit 1 month ago.  Continue Trulicity 1.5 mg/week for 1 more week, and then I have sent in the Trulicity 3 mg/week dose.  I have also placed a referral for  ophthalmology and foot exam was done today    Healthcare maintenance - Cologuard ordered - Patient given phone number to schedule mammogram   Marisol Giambra, D.O. [facility], PGY-3 Phone: [phone] Date 09/09/2023 Time 2:08 PM

## 2023-09-09 NOTE — Patient Instructions (Addendum)
 Thank you, Ms.Dionisio David Craney for allowing [place] to provide your care today. Today we discussed:  Blood pressure Keep taking lisinopril-hydrochlorothiazide two tablets per day Start amlodipine 5 mg daily Follow up in ~1 month for your blood pressure/recheck   Plastic surgery Call  [phone] to reschedule surgery  Stool kit (cologuard) to be mailed to you for colon cancer screening  I have ordered the following labs for you:  Lab Orders  No laboratory test(s) ordered today     Tests ordered today:  Cologuard  Referrals ordered today:   Referral Orders  No referral(s) requested today     I have ordered the following medication/changed the following medications:   Stop the following medications: There are no discontinued medications.   Start the following medications: No orders of the defined types were placed in this encounter.    Follow up:  4-6 weeks     Should you have any questions or concerns please call the internal medicine clinic at [phone].     Elza Rafter, D.O. [facility]

## 2023-09-09 NOTE — Assessment & Plan Note (Signed)
 A1c 6.1% at her last office visit 1 month ago.  Continue Trulicity 1.5 mg/week for 1 more week, and then I have sent in the Trulicity 3 mg/week dose.  I have also placed a referral for ophthalmology and foot exam was done today

## 2023-09-09 NOTE — Telephone Encounter (Signed)
 Prior Authorization for patient (Trulicity 3MG /0.5ML auto-injectors) came through on cover my meds was submitted with last office notes and labs awaiting approval or denial.  ZOX:[id number]

## 2023-09-14 NOTE — Progress Notes (Signed)
 Internal Medicine Clinic Attending  Case discussed with the resident at the time of the visit.  We reviewed the resident's history and exam and pertinent patient test results.  I agree with the assessment, diagnosis, and plan of care documented in the resident's note.

## 2023-09-15 ENCOUNTER — Telehealth: Payer: Self-pay | Admitting: *Deleted

## 2023-09-15 ENCOUNTER — Other Ambulatory Visit: Payer: Self-pay | Admitting: Internal Medicine

## 2023-09-15 DIAGNOSIS — Z1231 Encounter for screening mammogram for malignant neoplasm of breast: Secondary | ICD-10-CM

## 2023-09-15 NOTE — Telephone Encounter (Signed)
 Mammogram appointment March 26.2025 @ 10:40 am to arrive 10:20 am /breast center. Patient is aware to call the breast center is unable to keep or to reschedule appointment failure to do so will be a 75.00 no show fee. Appointment also mailed to the patient.

## 2023-09-15 NOTE — Progress Notes (Signed)
 Order placed for mammogram for breast cancer screening.

## 2023-09-28 ENCOUNTER — Telehealth: Admitting: Physician Assistant

## 2023-09-28 DIAGNOSIS — J069 Acute upper respiratory infection, unspecified: Secondary | ICD-10-CM

## 2023-09-28 DIAGNOSIS — Z9109 Other allergy status, other than to drugs and biological substances: Secondary | ICD-10-CM

## 2023-09-28 MED ORDER — FEXOFENADINE HCL 180 MG PO TABS: 180.0000 mg | ORAL_TABLET | Freq: Every day | ORAL | 1 refills | Status: DC

## 2023-09-28 MED ORDER — FLUTICASONE PROPIONATE 50 MCG/ACT NA SUSP: 2.0000 | Freq: Every day | NASAL | 1 refills | Status: DC

## 2023-09-28 NOTE — Progress Notes (Signed)
 Virtual Visit Consent   Ann Stone, you are scheduled for a virtual visit with a [facility] provider today. Just as with appointments in the office, your consent must be obtained to participate. Your consent will be active for this visit and any virtual visit you may have with one of our providers in the next 365 days. If you have a MyChart account, a copy of this consent can be sent to you electronically.  As this is a virtual visit, video technology does not allow for your provider to perform a traditional examination. This may limit your provider's ability to fully assess your condition. If your provider identifies any concerns that need to be evaluated in person or the need to arrange testing (such as labs, EKG, etc.), we will make arrangements to do so. Although advances in technology are sophisticated, we cannot ensure that it will always work on either your end or our end. If the connection with a video visit is poor, the visit may have to be switched to a telephone visit. With either a video or telephone visit, we are not always able to ensure that we have a secure connection.  By engaging in this virtual visit, you consent to the provision of healthcare and authorize for your insurance to be billed (if applicable) for the services provided during this visit. Depending on your insurance coverage, you may receive a charge related to this service.  I need to obtain your verbal consent now. Are you willing to proceed with your visit today? Ann Stone has provided verbal consent on 09/28/2023 for a virtual visit (video or telephone). Gilberto Better, [place]  Date: 09/28/2023 6:47 PM   Virtual Visit via Video Note   I, Loral Campi, connected with  Ann Stone  ([id number], October 07, 1965) (58) on 09/28/23 at  6:45 PM EDT by a video-enabled telemedicine application and verified that I am speaking with the correct person using two identifiers.  Location: Patient: Virtual Visit Location Patient:  Home Provider: Virtual Visit Location Provider: Home Office   I discussed the limitations of evaluation and management by telemedicine and the availability of in person appointments. The patient expressed understanding and agreed to proceed.    History of Present Illness: Ann Stone is a 58 y.o. who identifies as a female who was assigned female at birth, and is being seen today for cough, sore throat x 10 days.  HPI: 58 y/o F presents via video telehealth visit for c/o sore throat, cough worse at night x 10 days. Denies fever. Able to drink and eat without pain. No known exposure to strep. Has been taking Coricidin without much relief.     Problems:  Patient Active Problem List   Diagnosis Date Noted   Healthcare maintenance 09/09/2023   Blurry vision, bilateral 01/20/2023   Generalized anxiety disorder 03/26/2022   Gout 02/26/2022   Diabetes (HCC) 05/29/2021   Vaginal mass 04/25/2021   Sinus tachycardia 10/16/2020   Depression 07/22/2010   Hyperlipidemia 08/02/2008   Tobacco use disorder, moderate, in sustained remission 06/29/2006   Hypertension, essential 06/29/2006    Allergies:  Allergies  Allergen Reactions   Influenza Vaccines     She had an allergic reaction in the past - arm became extremely swollen at the site of injection.    Metoprolol Tartrate     REACTION: Numbness, weakness and tingling   Penicillins    Medications:  Current Outpatient Medications:    fexofenadine (ALLEGRA) 180 MG tablet, Take 1 tablet (  180 mg total) by mouth daily., Disp: 30 tablet, Rfl: 1   fluticasone (FLONASE) 50 MCG/ACT nasal spray, Place 2 sprays into both nostrils daily., Disp: 16 g, Rfl: 1   amLODipine (NORVASC) 5 MG tablet, Take 1 tablet (5 mg total) by mouth daily., Disp: 90 tablet, Rfl: 3   Dulaglutide (TRULICITY) 3 MG/0.5ML SOAJ, Inject 3 mg as directed once a week., Disp: 3 mL, Rfl: 1   ezetimibe (ZETIA) 10 MG tablet, Take 1 tablet (10 mg total) by mouth daily., Disp: 90  tablet, Rfl: 3   lisinopril-hydrochlorothiazide (ZESTORETIC) 20-12.5 MG tablet, Take 2 tablets by mouth daily., Disp: 60 tablet, Rfl: 1   rosuvastatin (CRESTOR) 40 MG tablet, Take 1 tablet (40 mg total) by mouth daily., Disp: 30 tablet, Rfl: 11   sertraline (ZOLOFT) 50 MG tablet, Take 1 tablet (50 mg total) by mouth daily., Disp: 30 tablet, Rfl: 1  Observations/Objective: Patient is well-developed, well-nourished in no acute distress.  Resting comfortably  at home.  Head is normocephalic, atraumatic.  No labored breathing.  Speech is clear and coherent with logical content.  Patient is alert and oriented at baseline.    Assessment and Plan: 1. Viral upper respiratory tract infection (Primary) - fluticasone (FLONASE) 50 MCG/ACT nasal spray; Place 2 sprays into both nostrils daily.  Dispense: 16 g; Refill: 1  2. Environmental allergies - fexofenadine (ALLEGRA) 180 MG tablet; Take 1 tablet (180 mg total) by mouth daily.  Dispense: 30 tablet; Refill: 1  Increase fluids Use warm water gargles for sore throat Take anti-histamine and nasal spray as prescribed Eat soft ice cream to help soothe sore throat Consider throat logenzes for throat pain. Continue to watch for worsening symptoms. Schedule another virtual visit appointment if symptoms don't improve.  Pt verbalized understanding and in agreement.    Follow Up Instructions: I discussed the assessment and treatment plan with the patient. The patient was provided an opportunity to ask questions and all were answered. The patient agreed with the plan and demonstrated an understanding of the instructions.  A copy of instructions were sent to the patient via MyChart unless otherwise noted below.   Patient has requested to receive PHI (AVS, Work Notes, etc) pertaining to this video visit through e-mail as they are currently without active MyChart. They have voiced understand that email is not considered secure and their health information  could be viewed by someone other than the patient.   The patient was advised to call back or seek an in-person evaluation if the symptoms worsen or if the condition fails to improve as anticipated.    Gilberto Better, PA-C

## 2023-09-28 NOTE — Patient Instructions (Signed)
 [facility] [place], thank you for joining Gilberto Better, PA-C for today's virtual visit.  While this provider is not your primary care provider (PCP), if your PCP is located in our provider database this encounter information will be shared with them immediately following your visit.   A [facility] MyChart account gives you access to today's visit and all your visits, tests, and labs performed at [facility] " click here if you don't have a [facility] MyChart account or go to [email or web address]  Consent: (Patient) Dionisio David Ferrington provided verbal consent for this virtual visit at the beginning of the encounter.  Current Medications:  Current Outpatient Medications:    fexofenadine (ALLEGRA) 180 MG tablet, Take 1 tablet (180 mg total) by mouth daily., Disp: 30 tablet, Rfl: 1   fluticasone (FLONASE) 50 MCG/ACT nasal spray, Place 2 sprays into both nostrils daily., Disp: 16 g, Rfl: 1   amLODipine (NORVASC) 5 MG tablet, Take 1 tablet (5 mg total) by mouth daily., Disp: 90 tablet, Rfl: 3   Dulaglutide (TRULICITY) 3 MG/0.5ML SOAJ, Inject 3 mg as directed once a week., Disp: 3 mL, Rfl: 1   ezetimibe (ZETIA) 10 MG tablet, Take 1 tablet (10 mg total) by mouth daily., Disp: 90 tablet, Rfl: 3   lisinopril-hydrochlorothiazide (ZESTORETIC) 20-12.5 MG tablet, Take 2 tablets by mouth daily., Disp: 60 tablet, Rfl: 1   rosuvastatin (CRESTOR) 40 MG tablet, Take 1 tablet (40 mg total) by mouth daily., Disp: 30 tablet, Rfl: 11   sertraline (ZOLOFT) 50 MG tablet, Take 1 tablet (50 mg total) by mouth daily., Disp: 30 tablet, Rfl: 1   Medications ordered in this encounter:  Meds ordered this encounter  Medications   fluticasone (FLONASE) 50 MCG/ACT nasal spray    Sig: Place 2 sprays into both nostrils daily.    Dispense:  16 g    Refill:  1    Supervising Provider:   Merrilee Jansky [[id number]]   fexofenadine (ALLEGRA) 180 MG tablet    Sig: Take 1 tablet (180 mg total) by mouth daily.     Dispense:  30 tablet    Refill:  1    Supervising Provider:   Merrilee Jansky [phone]     *If you need refills on other medications prior to your next appointment, please contact your pharmacy*  Follow-Up: Call back or seek an in-person evaluation if the symptoms worsen or if the condition fails to improve as anticipated.  [facility] [phone]  Other Instructions Increase fluids Use warm water gargles for sore throat Take anti-histamine and nasal spray as prescribed Eat soft ice cream to help soothe sore throat Consider throat logenzes for throat pain. Continue to watch for worsening symptoms. Schedule another virtual visit appointment if symptoms don't improve.    If you have been instructed to have an in-person evaluation today at a local Urgent Care facility, please use the link below. It will take you to a list of all of our available [facility], including address, phone number and hours of operation. Please do not delay care.  [facility]  If you or a family member do not have a primary care provider, use the link below to schedule a visit and establish care. When you choose a [facility] primary care physician or advanced practice provider, you gain a long-term partner in health. Find a Primary Care Provider  Learn more about [facility]'s in-office and virtual care options:  - Get  Care Now

## 2023-10-01 ENCOUNTER — Telehealth: Admitting: Physician Assistant

## 2023-10-01 DIAGNOSIS — J020 Streptococcal pharyngitis: Secondary | ICD-10-CM | POA: Diagnosis not present

## 2023-10-01 MED ORDER — AZITHROMYCIN 250 MG PO TABS: ORAL_TABLET | ORAL | 0 refills | Status: AC

## 2023-10-01 NOTE — Patient Instructions (Signed)
 Little [place], thank you for joining [name], PA-C for today's virtual visit.  While this provider is not your primary care provider (PCP), if your PCP is located in our provider database this encounter information will be shared with them immediately following your visit.   A [facility] MyChart account gives you access to today's visit and all your visits, tests, and labs performed at [facility] " click here if you don't have a [facility] MyChart account or go to [email or web address]  Consent: (Patient) Ann Stone provided verbal consent for this virtual visit at the beginning of the encounter.  Current Medications:  Current Outpatient Medications:    azithromycin (ZITHROMAX) 250 MG tablet, Take 2 tablets on day 1, then 1 tablet daily on days 2 through 5, Disp: 6 tablet, Rfl: 0   amLODipine (NORVASC) 5 MG tablet, Take 1 tablet (5 mg total) by mouth daily., Disp: 90 tablet, Rfl: 3   Dulaglutide (TRULICITY) 3 MG/0.5ML SOAJ, Inject 3 mg as directed once a week., Disp: 3 mL, Rfl: 1   ezetimibe (ZETIA) 10 MG tablet, Take 1 tablet (10 mg total) by mouth daily., Disp: 90 tablet, Rfl: 3   fexofenadine (ALLEGRA) 180 MG tablet, Take 1 tablet (180 mg total) by mouth daily., Disp: 30 tablet, Rfl: 1   fluticasone (FLONASE) 50 MCG/ACT nasal spray, Place 2 sprays into both nostrils daily., Disp: 16 g, Rfl: 1   lisinopril-hydrochlorothiazide (ZESTORETIC) 20-12.5 MG tablet, Take 2 tablets by mouth daily., Disp: 60 tablet, Rfl: 1   rosuvastatin (CRESTOR) 40 MG tablet, Take 1 tablet (40 mg total) by mouth daily., Disp: 30 tablet, Rfl: 11   sertraline (ZOLOFT) 50 MG tablet, Take 1 tablet (50 mg total) by mouth daily., Disp: 30 tablet, Rfl: 1   Medications ordered in this encounter:  Meds ordered this encounter  Medications   azithromycin (ZITHROMAX) 250 MG tablet    Sig: Take 2 tablets on day 1, then 1 tablet daily on days 2 through 5    Dispense:  6 tablet    Refill:  0     Supervising Provider:   Merrilee Jansky [phone]     *If you need refills on other medications prior to your next appointment, please contact your pharmacy*  Follow-Up: Call back or seek an in-person evaluation if the symptoms worsen or if the condition fails to improve as anticipated.  [facility] [phone]  Other Instructions Strep Throat, Adult Strep throat is an infection in the throat that is caused by bacteria. It is common during the cold months of the year. It mostly affects children who are 48-37 years old. However, people of all ages can get it at any time of the year. This infection spreads from person to person (is contagious) through coughing, sneezing, or having close contact. Your health care provider may use other names to describe the infection. When strep throat affects the tonsils, it is called tonsillitis. When it affects the back of the throat, it is called pharyngitis. What are the causes? This condition is caused by the Streptococcus pyogenes bacteria. What increases the risk? You are more likely to develop this condition if: You care for school-age children, or are around school-age children. Children are more likely to get strep throat and may spread it to others. You spend time in crowded places where the infection can spread easily. You have close contact with someone who has strep throat. What are the signs or symptoms? Symptoms of  this condition include: Fever or chills. Redness, swelling, or pain in the tonsils or throat. Pain or difficulty when swallowing. White or yellow spots on the tonsils or throat. Tender glands in the neck and under the jaw. Bad smelling breath. Red rash all over the body. This is rare. How is this diagnosed? This condition is diagnosed by tests that check for the presence and the amount of bacteria that cause strep throat. They are: Rapid strep test. Your throat is swabbed and checked for the presence of  bacteria. Results are usually ready in minutes. Throat culture test. Your throat is swabbed. The sample is placed in a cup that allows infections to grow. Results are usually ready in 1 or 2 days. How is this treated? This condition may be treated with: Medicines that kill germs (antibiotics). Medicines that relieve pain or fever. These include: Ibuprofen or acetaminophen. Aspirin, only for people who are over the age of 58. Throat lozenges. Throat sprays. Follow these instructions at home: Medicines  Take over-the-counter and prescription medicines only as told by your health care provider. Take your antibiotic medicine as told by your health care provider. Do not stop taking the antibiotic even if you start to feel better. Eating and drinking  If you have trouble swallowing, try eating soft foods until your sore throat feels better. Drink enough fluid to keep your urine pale yellow. To help relieve pain, you may have: Warm fluids, such as soup and tea. Cold fluids, such as frozen desserts or popsicles. General instructions Gargle with a salt-water mixture 3-4 times a day or as needed. To make a salt-water mixture, completely dissolve -1 tsp (3-6 g) of salt in 1 cup (237 mL) of warm water. Get plenty of rest. Stay home from work or school until you have been taking antibiotics for 24 hours. Do not use any products that contain nicotine or tobacco. These products include cigarettes, chewing tobacco, and vaping devices, such as e-cigarettes. If you need help quitting, ask your health care provider. It is up to you to get your test results. Ask your health care provider, or the department that is doing the test, when your results will be ready. Keep all follow-up visits. This is important. How is this prevented?  Do not share food, drinking cups, or personal items that could cause the infection to spread to other people. Wash your hands often with soap and water for at least 20  seconds. If soap and water are not available, use hand sanitizer. Make sure that all people in your house wash their hands well. Have family members tested if they have a sore throat or fever. They may need an antibiotic if they have strep throat. Contact a health care provider if: You have swelling in your neck that keeps getting bigger. You develop a rash, cough, or earache. You cough up a thick mucus that is green, yellow-brown, or bloody. You have pain or discomfort that does not get better with medicine. Your symptoms seem to be getting worse. You have a fever. Get help right away if: You have new symptoms, such as vomiting, severe headache, stiff or painful neck, chest pain, or shortness of breath. You have severe throat pain, drooling, or changes in your voice. You have swelling of the neck, or the skin on the neck becomes red and tender. You have signs of dehydration, such as tiredness (fatigue), dry mouth, and decreased urination. You become increasingly sleepy, or you cannot wake up completely. Your joints become  red or painful. These symptoms may represent a serious problem that is an emergency. Do not wait to see if the symptoms will go away. Get medical help right away. Call your local emergency services (911 in the [place] Do not drive yourself to the hospital. Summary Strep throat is an infection in the throat that is caused by the Streptococcus pyogenes bacteria. This infection is spread from person to person (is contagious) through coughing, sneezing, or having close contact. Take your medicines, including antibiotics, as told by your health care provider. Do not stop taking the antibiotic even if you start to feel better. To prevent the spread of germs, wash your hands well with soap and water. Have others do the same. Do not share food, drinking cups, or personal items. Get help right away if you have new symptoms, such as vomiting, severe headache, stiff or painful neck, chest  pain, or shortness of breath. This information is not intended to replace advice given to you by your health care provider. Make sure you discuss any questions you have with your health care provider. Document Revised: 10/23/2020 Document Reviewed: 10/23/2020 Elsevier Patient Education  2024 Elsevier Inc.   If you have been instructed to have an in-person evaluation today at a local Urgent Care facility, please use the link below. It will take you to a list of all of our available [facility], including address, phone number and hours of operation. Please do not delay care.  [facility]  If you or a family member do not have a primary care provider, use the link below to schedule a visit and establish care. When you choose a [facility] primary care physician or advanced practice provider, you gain a long-term partner in health. Find a Primary Care Provider  Learn more about [facility]'s in-office and virtual care options: [facility] - Get Care Now

## 2023-10-01 NOTE — Progress Notes (Signed)
 Virtual Visit Consent   Ann Stone, you are scheduled for a virtual visit with a [facility] provider today. Just as with appointments in the office, your consent must be obtained to participate. Your consent will be active for this visit and any virtual visit you may have with one of our providers in the next 365 days. If you have a MyChart account, a copy of this consent can be sent to you electronically.  As this is a virtual visit, video technology does not allow for your provider to perform a traditional examination. This may limit your provider's ability to fully assess your condition. If your provider identifies any concerns that need to be evaluated in person or the need to arrange testing (such as labs, EKG, etc.), we will make arrangements to do so. Although advances in technology are sophisticated, we cannot ensure that it will always work on either your end or our end. If the connection with a video visit is poor, the visit may have to be switched to a telephone visit. With either a video or telephone visit, we are not always able to ensure that we have a secure connection.  By engaging in this virtual visit, you consent to the provision of healthcare and authorize for your insurance to be billed (if applicable) for the services provided during this visit. Depending on your insurance coverage, you may receive a charge related to this service.  I need to obtain your verbal consent now. Are you willing to proceed with your visit today? Ann Stone has provided verbal consent on 10/01/2023 for a virtual visit (video or telephone). Ann Loveless, PA-C  Date: 10/01/2023 11:24 AM   Virtual Visit via Video Note   I, Ann Stone, connected with  Ann Stone  ([id number], 07-Sep-1965) on 10/01/23 at 11:15 AM EDT by a video-enabled telemedicine application and verified that I am speaking with the correct person using two identifiers.  Location: Patient: Virtual Visit Location  Patient: Home Provider: Virtual Visit Location Provider: Home Office   I discussed the limitations of evaluation and management by telemedicine and the availability of in person appointments. The patient expressed understanding and agreed to proceed.    History of Present Illness: Ann Stone is a 58 y.o. who identifies as a female who was assigned female at birth, and is being seen today for sore throat.  HPI: Sore Throat  This is a new problem. The current episode started in the past 7 days (Seen on 09/28/23 and diagnosed with viral uri). The problem has been gradually worsening. The pain is worse on the left side. There has been no fever. The pain is moderate. Associated symptoms include congestion, coughing (mild), ear pain (left), swollen glands and trouble swallowing. Pertinent negatives include no diarrhea, ear discharge, headaches, hoarse voice, plugged ear sensation, shortness of breath or vomiting. She has had no exposure to strep or mono. She has tried acetaminophen (allegra, flonase, cough drops) for the symptoms. The treatment provided no relief.     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    Metoprolol Tartrate     REACTION: Numbness, weakness and tingling   Penicillins    Medications:  Current Outpatient Medications:    azithromycin (ZITHROMAX) 250 MG tablet, Take 2 tablets on day 1, then 1 tablet daily  on days 2 through 5, Disp: 6 tablet, Rfl: 0   amLODipine (NORVASC) 5 MG tablet, Take 1 tablet (5 mg total) by mouth daily., Disp: 90 tablet, Rfl: 3   Dulaglutide (TRULICITY) 3 MG/0.5ML SOAJ, Inject 3 mg as directed once a week., Disp: 3 mL, Rfl: 1   ezetimibe (ZETIA) 10 MG tablet, Take 1 tablet (10 mg total) by mouth daily., Disp: 90 tablet, Rfl: 3   fexofenadine (ALLEGRA) 180 MG tablet, Take 1 tablet (180 mg total) by mouth daily., Disp: 30 tablet, Rfl: 1   fluticasone (FLONASE) 50 MCG/ACT nasal spray, Place 2 sprays into both nostrils daily., Disp: 16 g, Rfl: 1   lisinopril-hydrochlorothiazide (ZESTORETIC) 20-12.5 MG tablet, Take 2 tablets by mouth daily., Disp: 60 tablet, Rfl: 1   rosuvastatin (CRESTOR) 40 MG tablet, Take 1 tablet (40 mg total) by mouth daily., Disp: 30 tablet, Rfl: 11   sertraline (ZOLOFT) 50 MG tablet, Take 1 tablet (50 mg total) by mouth daily., Disp: 30 tablet, Rfl: 1  Observations/Objective: Patient is well-developed, well-nourished in no acute distress.  Resting comfortably at home.  Head is normocephalic, atraumatic.  No labored breathing.  Speech is clear and coherent with logical content.  Patient is alert and oriented at baseline.    Assessment and Plan: 1. Strep pharyngitis (Primary) - azithromycin (ZITHROMAX) 250 MG tablet; Take 2 tablets on day 1, then 1 tablet daily on days 2 through 5  Dispense: 6 tablet; Refill: 0  - Suspect strep throat - Azithromycin prescribed - Tylenol and Ibuprofen alternating every 4 hours - Salt water gargles - Chloraseptic spray - Liquid and soft food diet - Push fluids - New toothbrush in 3 days - Seek in person evaluation if not improving or if symptoms worsen   Follow Up Instructions: I discussed the assessment and treatment plan with the patient. The patient was provided an opportunity to ask questions and all were answered. The patient agreed with the plan and demonstrated an understanding of the instructions.  A  copy of instructions were sent to the patient via MyChart unless otherwise noted below.    The patient was advised to call back or seek an in-person evaluation if the symptoms worsen or if the condition fails to improve as anticipated.    Ann Loveless, PA-C

## 2023-10-07 ENCOUNTER — Ambulatory Visit

## 2023-10-13 NOTE — Progress Notes (Unsigned)
 [facility]: Clinic Note  Subjective:  History of Present Illness: Ann Stone is a 58 y.o. year old female who presents for 1 month followup of chronic medical conditions including HTN.  [ ]  PHQ9, GAD7  HTN - Continue lisinopril-HCTZ 40-25 mg daily - Start amlodipine 5 mg daily - Follow-up in 1 month  Diabetes (HCC) A1c 6.1% at her last office visit 1 month ago.  Continue Trulicity 1.5 mg/week for 1 more week, and then I have sent in the Trulicity 3 mg/week dose.  I have also placed a referral for ophthalmology and foot exam was done today       Healthcare maintenance - Cologuard ordered - Patient given phone number to schedule mammogram   Generalized anxiety disorder - GAD 7 is 20 - Increase Prozac from 40 to 60mg  daily - Start Hydroxyzine 25mg  TID prn severe anxiety - Referral to [facility]    Hyperlipidemia - Continue Crestor 40mg  daily with Zetia (started 02/2022) - Repeat lipids in a few visits, too soon to recheck now   Diabetes (HCC) - A1C 5.7 in 02/2022, not due for recheck - Continue Metformin 1000mg  BID - Eye referral is in - Foot exam normal in 02/2022 - checking urine MACR today        Patient will follow up in 4 weeks. At that visit please: Repeat PHQ9 & GAD7 (score was 20). Assess how anxiety is on new medicines. Refer to [facility] if she hasn't seen [facility] yet Follow up blood pressure. If still high, would suggest Amlo 5mg  daily  Follow up on eye exam & MMG, both ordered at last visit in August   Please refer to Assessment and Plan below for full details in Problem-Based Charting.   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   Hypertension, essential 06/29/2006      Medications:  Current Outpatient Medications:    amLODipine (NORVASC) 5 MG tablet, Take 1 tablet (5 mg total) by mouth daily., Disp: 90 tablet, Rfl: 3   Dulaglutide (TRULICITY) 3 MG/0.5ML SOAJ, Inject 3 mg as directed once a week., Disp: 3 mL, Rfl: 1   ezetimibe (ZETIA) 10 MG tablet, Take 1 tablet (10 mg total) by mouth daily., Disp: 90 tablet, Rfl: 3   fexofenadine (ALLEGRA) 180 MG tablet, Take 1 tablet (180 mg total) by mouth daily., Disp: 30 tablet, Rfl: 1   fluticasone (FLONASE) 50 MCG/ACT nasal spray, Place 2 sprays into both nostrils daily., Disp: 16 g, Rfl: 1   lisinopril-hydrochlorothiazide (ZESTORETIC) 20-12.5 MG tablet, Take 2 tablets by mouth daily., Disp: 60 tablet, Rfl: 1   rosuvastatin (CRESTOR) 40 MG tablet, Take 1 tablet (40 mg total) by mouth daily., Disp: 30 tablet, Rfl: 11   sertraline (ZOLOFT) 50 MG tablet, Take 1 tablet (50 mg total) by mouth daily., Disp: 30 tablet, Rfl: 1   Allergies: Allergies  Allergen Reactions   Influenza Vaccines     She had an allergic reaction in the past - arm became extremely swollen at the site of injection.    Metoprolol Tartrate     REACTION: Numbness, weakness and tingling  Penicillins        Objective:   Vitals: There were no vitals filed for this visit.   Physical Exam: Physical Exam   Data: Labs, imaging, and micro were reviewed in Epic. Refer to Assessment and Plan below for full details in Problem-Based Charting.  Assessment & Plan:  No problem-specific Assessment & Plan notes found for this encounter.     Patient will follow up in ***  Mercie Eon, MD

## 2023-10-14 ENCOUNTER — Ambulatory Visit: Payer: Commercial Managed Care - HMO | Admitting: Internal Medicine

## 2023-10-14 ENCOUNTER — Encounter: Payer: Self-pay | Admitting: Internal Medicine

## 2023-10-14 ENCOUNTER — Other Ambulatory Visit: Payer: Self-pay

## 2023-10-14 VITALS — BP 121/70 | HR 102 | Temp 98.2°F | Ht 65.0 in | Wt 210.1 lb

## 2023-10-14 DIAGNOSIS — F331 Major depressive disorder, recurrent, moderate: Secondary | ICD-10-CM

## 2023-10-14 DIAGNOSIS — Z794 Long term (current) use of insulin: Secondary | ICD-10-CM

## 2023-10-14 DIAGNOSIS — Z1211 Encounter for screening for malignant neoplasm of colon: Secondary | ICD-10-CM

## 2023-10-14 DIAGNOSIS — H538 Other visual disturbances: Secondary | ICD-10-CM

## 2023-10-14 DIAGNOSIS — E119 Type 2 diabetes mellitus without complications: Secondary | ICD-10-CM | POA: Diagnosis not present

## 2023-10-14 DIAGNOSIS — E782 Mixed hyperlipidemia: Secondary | ICD-10-CM

## 2023-10-14 DIAGNOSIS — I10 Essential (primary) hypertension: Secondary | ICD-10-CM

## 2023-10-14 DIAGNOSIS — Z0000 Encounter for general adult medical examination without abnormal findings: Secondary | ICD-10-CM

## 2023-10-14 MED ORDER — EZETIMIBE 10 MG PO TABS: 10.0000 mg | ORAL_TABLET | Freq: Every day | ORAL | 3 refills | Status: AC

## 2023-10-14 MED ORDER — ROSUVASTATIN CALCIUM 40 MG PO TABS: 40.0000 mg | ORAL_TABLET | Freq: Every day | ORAL | 3 refills | Status: AC

## 2023-10-14 NOTE — Assessment & Plan Note (Signed)
-   chronic and stable - continue lisinopril 40 - hydrochlorothiazide 25mg  daily - continue amlodipine 5mg  daily

## 2023-10-14 NOTE — Assessment & Plan Note (Signed)
-   chronic and stable - A1C 6.1 - continue Trulicity 3mg  weekly, which she is tolerating well

## 2023-10-14 NOTE — Assessment & Plan Note (Signed)
-   Cologuard re-ordered, patient never received it - She has the number to call to schedule mammogram, so will call them to reschedule this

## 2023-10-14 NOTE — Assessment & Plan Note (Signed)
-   chronic and stable, PHQ9 3 today - continue zoloft 50mg  daily - Patient is aware that we offer BHT if she's interested in the future

## 2023-10-14 NOTE — Patient Instructions (Signed)
 Thank you, [name] for allowing [place] to provide your care today. Today we discussed your blurry vision, your blood pressure, and your blood sugars.    I have ordered the following labs for you:   Lab Orders         Cologuard         Lipid Profile      Tests ordered today:  None  Referrals ordered today:    Referral Orders         Ambulatory referral to Ophthalmology      I have ordered the following medication/changed the following medications:   Stop the following medications: Medications Discontinued During This Encounter  Medication Reason   ezetimibe (ZETIA) 10 MG tablet Reorder   rosuvastatin (CRESTOR) 40 MG tablet Reorder     Start the following medications: Meds ordered this encounter  Medications   ezetimibe (ZETIA) 10 MG tablet    Sig: Take 1 tablet (10 mg total) by mouth daily.    Dispense:  90 tablet    Refill:  3   rosuvastatin (CRESTOR) 40 MG tablet    Sig: Take 1 tablet (40 mg total) by mouth daily.    Dispense:  90 tablet    Refill:  3     Return in about 6 months (around 04/14/2024) for Chronic medical conditions.    Remember:  - Please call to schedule your mammogram - Please call me if you want to increase your Zoloft - We are working on mailing you the Cologuard test to screen for colon cancer   Should you have any questions or concerns please call the internal medicine clinic at [phone].     Mercie Eon, MD Faculty, Internal Medicine Teaching Progam [facility]

## 2023-10-14 NOTE — Assessment & Plan Note (Signed)
-   Likely presbyopia, but she's also due for diabetic retinopathy screening  - Repeat referral to Ophthalmology placed

## 2023-10-14 NOTE — Assessment & Plan Note (Signed)
-   Lipid panel today - Continue Rosuvastatin 40mg  daily & Zetia 10mg  daily

## 2023-10-15 ENCOUNTER — Encounter: Payer: Self-pay | Admitting: Internal Medicine

## 2023-10-15 LAB — LIPID PANEL
Chol/HDL Ratio: 4.2 ratio (ref 0.0–4.4)
Cholesterol, Total: 232 mg/dL — ABNORMAL HIGH (ref 100–199)
HDL: 55 mg/dL (ref >39)
LDL Chol Calc (NIH): 149 mg/dL — ABNORMAL HIGH (ref 0–99)
Triglycerides: 155 mg/dL — ABNORMAL HIGH (ref 0–149)
VLDL Cholesterol Cal: 28 mg/dL (ref 5–40)

## 2023-10-23 ENCOUNTER — Other Ambulatory Visit: Payer: Self-pay | Admitting: Student

## 2023-10-23 DIAGNOSIS — F331 Major depressive disorder, recurrent, moderate: Secondary | ICD-10-CM

## 2023-11-02 ENCOUNTER — Telehealth: Payer: Self-pay | Admitting: *Deleted

## 2023-11-02 NOTE — Telephone Encounter (Signed)
 It appears this patient was a no show for appointment on 10-07-2023 @ 10:40 am.

## 2023-11-04 ENCOUNTER — Encounter: Payer: Self-pay | Admitting: Surgical

## 2023-11-04 ENCOUNTER — Telehealth: Payer: Self-pay | Admitting: *Deleted

## 2023-11-04 ENCOUNTER — Ambulatory Visit (INDEPENDENT_AMBULATORY_CARE_PROVIDER_SITE_OTHER): Admitting: Surgical

## 2023-11-04 VITALS — BP 145/90 | HR 98 | Ht 65.0 in | Wt 200.0 lb

## 2023-11-04 DIAGNOSIS — L910 Hypertrophic scar: Secondary | ICD-10-CM

## 2023-11-04 NOTE — Telephone Encounter (Addendum)
 I called pt to let her know to stop Trulicity 1 week prior to procedure. Pt stated it has been placed on hold until they get in contact with her cardiologist.

## 2023-11-04 NOTE — Telephone Encounter (Signed)
 Copied from CRM [id number]. Topic: General - Other >> Nov 04, 2023  1:40 PM Carrielelia G wrote: Reason for CRM:  Patient Ann Stone is calling and she states she is having a procedure May 6, and provider Scheeler, Janalyn Me, PA-C would like her off her medication: Dulaglutide (TRULICITY) 3 MG/0.5ML SOAJ.Aaron Aas a week prior to the procedure. Please advise.

## 2023-11-04 NOTE — Progress Notes (Signed)
 Patient ID: Ann Stone, female    DOB: [birth year], 58 y.o.   MRN: [id number]  Chief Complaint  Patient presents with   Pre-op Exam      ICD-10-CM   1. Keloid of skin  L91.0 Ambulatory referral to Radiation Oncology      History of Present Illness: ARMANIE Stone is a 58 y.o.  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  She reports that she is on dulaglutide/Trulicity.  She injects this weekly.  She has had the keloid injected in the past, unfortunately it recurred.  She has not seen radiation oncology yet to discuss radiation to her left ear.  She reports that the ear keloid has increased in size.  She was last seen by Dr. Carolynne Citron September 2024.  She is interested in pursuing surgical intervention for excision of the left ear keloid.  She is scheduled for the procedure on 11/17/2023 with Dr. Carolynne Citron, however this will need to be rescheduled.  She has a history of hypertension and tachycardia, was seen by cardiologist in 2022, was recommended to follow-up as needed.  She does see PCP regularly for diabetes, hypertension  Post recent A1c 6.1.  She reports has been feeling well lately, no major changes to her health.  Past Medical History: Allergies: Allergies  Allergen Reactions   Influenza Vaccines     She had an allergic reaction in the past - arm became extremely swollen at the site of injection.    Metoprolol  Tartrate     REACTION: Numbness, weakness and tingling   Penicillins     Current Medications:  Current Outpatient Medications:    amLODipine  (NORVASC ) 5 MG tablet, Take 1 tablet (5 mg total) by mouth daily., Disp: 90 tablet, Rfl: 3   Dulaglutide (TRULICITY) 3 MG/0.5ML SOAJ, Inject 3 mg as directed once a week., Disp: 3 mL, Rfl: 1   ezetimibe  (ZETIA ) 10 MG tablet, Take 1 tablet (10 mg total) by mouth daily., Disp: 90 tablet, Rfl: 3   fexofenadine  (ALLEGRA ) 180 MG tablet, Take 1 tablet (180 mg total) by mouth daily., Disp: 30 tablet, Rfl: 1    fluticasone  (FLONASE ) 50 MCG/ACT nasal spray, Place 2 sprays into both nostrils daily., Disp: 16 g, Rfl: 1   lisinopril -hydrochlorothiazide  (ZESTORETIC ) 20-12.5 MG tablet, Take 2 tablets by mouth daily., Disp: 60 tablet, Rfl: 1   rosuvastatin  (CRESTOR ) 40 MG tablet, Take 1 tablet (40 mg total) by mouth daily., Disp: 90 tablet, Rfl: 3   sertraline  (ZOLOFT ) 50 MG tablet, Take 1 tablet by mouth once daily, Disp: 30 tablet, Rfl: 0  Past Medical Problems: Past Medical History:  Diagnosis Date   Breast pain 09/08/2017   Depression    Diabetes (HCC)    GERD (gastroesophageal reflux disease)    Hair loss 05/30/2021   HTN (hypertension)    Hyperlipemia    Keloid 05/20/2022   Leiomyoma of uterus    s/p hysterectomy, hx of uterine hemorrhage   Sebaceous cyst 10/08/2020   Tobacco abuse    Vaginal mass     Past Surgical History: Past Surgical History:  Procedure Laterality Date   ABDOMINAL HYSTERECTOMY     OPERATIVE HYSTEROSCOPY      Social History: Social History   Socioeconomic History   Marital status: Single    Spouse name: Not on file   Number of children: Not on file   Years of education: Not on file   Highest education level: Not on file  Occupational History   Not on file  Tobacco Use   Smoking status: Former    Current packs/day: 0.00    Types: Cigarettes    Quit date: 08/02/2018    Years since quitting: 5.2   Smokeless tobacco: Never  Vaping Use   Vaping status: Never Used  Substance and Sexual Activity   Alcohol use: Yes    Comment: occasional   Drug use: Never   Sexual activity: Yes    Birth control/protection: Surgical  Other Topics Concern   Not on file  Social History Narrative   Not on file   Social Drivers of Health   Financial Resource Strain: Not on file  Food Insecurity: No Food Insecurity (10/14/2023)   Hunger Vital Sign    Worried About Running Out of Food in the Last Year: Never true    Ran Out of Food in the Last Year: Never true   Transportation Needs: No Transportation Needs (10/14/2023)   PRAPARE - Administrator, Civil Service (Medical): No    Lack of Transportation (Non-Medical): No  Physical Activity: Not on file  Stress: Not on file  Social Connections: Socially Isolated (10/14/2023)   Social Connection and Isolation Panel [NHANES]    Frequency of Communication with Friends and Family: More than three times a week    Frequency of Social Gatherings with Friends and Family: Once a week    Attends Religious Services: Never    Database administrator or Organizations: No    Attends Banker Meetings: Never    Marital Status: Widowed  Intimate Partner Violence: Not At Risk (10/14/2023)   Humiliation, Afraid, Rape, and Kick questionnaire    Fear of Current or Ex-Partner: No    Emotionally Abused: No    Physically Abused: No    Sexually Abused: No    Family History: Family History  Problem Relation Age of Onset   Cancer Mother    Osteoarthritis Mother    Cancer Father     Review of Systems: Review of Systems  Constitutional: Negative.   Respiratory: Negative.    Cardiovascular: Negative.   Neurological: Negative.     Physical Exam: Vital Signs BP (!) 145/90 (BP Location: Left Arm, Patient Position: Sitting, Cuff Size: Large)   Pulse 98   Ht 5\' 5"  (1.651 m)   Wt 200 lb (90.7 kg)   SpO2 95%   BMI 33.28 kg/m   Physical Exam Constitutional:      General: Not in acute distress.    Appearance: Normal appearance. Not ill-appearing.  HENT:     Head: Normocephalic and atraumatic.  Eyes:     Pupils: Pupils are equal, round Neck:     Musculoskeletal: Normal range of motion.  Cardiovascular:     Rate and Rhythm: Normal rate    Pulses: Normal pulses.  Pulmonary:     Effort: Pulmonary effort is normal. No respiratory distress.  Abdominal:     General: Abdomen is flat. There is no distension.  Musculoskeletal: Normal range of motion.  Skin:    General: Skin is warm and dry.      Findings: No erythema or rash.  Neurological:     General: No focal deficit present.     Mental Status: Alert and oriented to person, place, and time. Mental status is at baseline.     Motor: No weakness.  Psychiatric:        Mood and Affect: Mood normal.  Behavior: Behavior normal.    Assessment/Plan: Will send cardiac and PCP clearance to patient's PCP and cardiologist today to have prior to surgery. Patient not seen cardiology since 2022  Surgery will be postponed from 11/17/2023 until a few weeks later. Sent referral to patient oncology for patient to have consultation to discuss radiation after excision of left ear keloid.  Discussed patient's case with Dr. Carolynne Citron, he is in agreement with postponing for a few weeks until patient's clearances can be obtained and referral to radiation oncology is completed.  Pictures were obtained of the patient and placed in the chart with the patient's or guardian's permission.  Recommend calling with questions or concerns.  Surgical scheduling staff notified.  Electronically signed by: Janalyn Me Albi Rappaport, PA-C 11/04/2023 2:50 PM

## 2023-11-05 ENCOUNTER — Telehealth: Payer: Self-pay | Admitting: Radiation Oncology

## 2023-11-05 LAB — HM DIABETES EYE EXAM

## 2023-11-05 NOTE — Telephone Encounter (Signed)
 4/24 @ 9:03 am Called patient to be schedule for consult.  Patient not available at this time. She will call back later after her doctor's appt.  Waiting on call back.

## 2023-11-06 ENCOUNTER — Telehealth: Payer: Self-pay | Admitting: Radiation Oncology

## 2023-11-06 NOTE — Telephone Encounter (Signed)
 4/25 Called and spoke to patient, she now need new sx date (Pending) due to has to be cleared by Cardiology first (schedule 3 to 4 weeks out -per Melissa -ref coord.)  Waiting on new sx date, before reach back out to patient to be schedule for consult.

## 2023-11-09 ENCOUNTER — Telehealth: Payer: Self-pay

## 2023-11-09 NOTE — Telephone Encounter (Signed)
 Faxed surgical clearance on 11/06/2023 to Alois Arnt, MD (Cardiologist) and Driscilla George, MD (PCP) patient needs to be off Trulicity 1-2 weeks prior to surgery.  Received fax confirmation:  To:               Recipient at [phone] Subject:          Surgical Clearance Result:           The transmission was successful. Explanation:      All Pages Ok Pages Sent:       2 Connect Time:     0 minutes, 51 seconds Transmit Time:    11/06/2023 17:20 Transfer Rate:    14400 Status Code:      0000 Retry Count:      0 Job Id:           [id number] Unique Id:        [id number] Fax Line:         73 Fax Server:       [id number]  To:               Recipient at [phone] Subject:          Surgical Clearance Result:           The transmission was successful. Explanation:      All Pages Ok Pages Sent:       2 Connect Time:     0 minutes, 49 seconds Transmit Time:    11/06/2023 17:22 Transfer Rate:    14400 Status Code:      0000 Retry Count:      0 Job Id:           [id number] Unique Id:        [id number] Fax Line:         34 Fax Server:       [id number]                      -------Fax Transmission Report-------  To:               Recipient at [phone] Subject:          Surgical Clearance Result:           The transmission was successful. Explanation:      All Pages Ok Pages Sent:       2 Connect Time:     0 minutes, 49 seconds Transmit Time:    11/06/2023 17:22 Transfer Rate:    14400 Status Code:      0000 Retry Count:      0 Job Id:           [id number] Unique Id:        [id number] Fax Line:         34 Fax Server:       [email or web address]  To:               Recipient at [phone] Subject:          Surgical Clearance Result:           The transmission was successful. Explanation:      All Pages Ok Pages Sent:       2 Connect Time:     0 minutes, 51 seconds Transmit Time:    11/06/2023 17:20 Transfer Rate:    14400 Status  Code:      0000 Retry Count:      0 Job Id:  [id number] Unique Id:        [id number] Fax Line:         20 Fax Server:       [id number]

## 2023-11-09 NOTE — Telephone Encounter (Signed)
    Primary Cardiologist:Branch, Tomas Fountain, MD  Chart reviewed as part of pre-operative protocol coverage. Because of Ann Stone's past medical history and time since last visit, he/she will require a follow-up visit in order to better assess preoperative cardiovascular risk.  Pre-op covering staff: - Please schedule I office appointment and call patient to inform them. - Please contact requesting surgeon's office via preferred method (i.e, phone, fax) to inform them of need for appointment prior to surgery.  Patient's Trulicity is not prescribed by cardiology.  Recommendations for holding Trulicity will need to come from prescribing provider.  If applicable, this message will also be routed to pharmacy pool and/or primary cardiologist for input on holding anticoagulant/antiplatelet agent as requested below so that this information is available at time of patient's appointment.   Carie Charity, NP  11/09/2023, 11:13 AM

## 2023-11-09 NOTE — Telephone Encounter (Signed)
   Pre-operative Risk Assessment    Patient Name: Ann Stone  DOB: 29-Aug-1965 MRN: [id number]   Date of last office visit: 06/26/2021 Dr. Alois Arnt Date of next office visit: NONE   Request for Surgical Clearance    Procedure:   Excision of Left Ear Keloid and Reconstruction of Helix  Date of Surgery:  Clearance TBD                                Surgeon: Dr. Larraine Plush Surgeon's Group or Practice Name: [facility] Phone number: [phone] Fax number: [phone]   Type of Clearance Requested:   - Medical  - Pharmacy:  Hold Trulicity 1-2 weeks prior to surgery       Type of Anesthesia:  General    Additional requests/questions:    [name]   11/09/2023, 8:29 AM

## 2023-11-09 NOTE — Telephone Encounter (Signed)
 This is not a clearance for anticoagulation. Its a clearance to hold Trulicity which is prescribed by Dr. Verlene Glimpse

## 2023-11-11 ENCOUNTER — Other Ambulatory Visit: Payer: Self-pay | Admitting: Internal Medicine

## 2023-11-17 ENCOUNTER — Ambulatory Visit (HOSPITAL_BASED_OUTPATIENT_CLINIC_OR_DEPARTMENT_OTHER): Admit: 2023-11-17 | Admitting: Plastic Surgery

## 2023-11-17 ENCOUNTER — Encounter (HOSPITAL_BASED_OUTPATIENT_CLINIC_OR_DEPARTMENT_OTHER): Payer: Self-pay

## 2023-11-17 SURGERY — REVISION, SCAR
Anesthesia: Choice | Laterality: Left

## 2023-11-25 ENCOUNTER — Encounter: Admitting: Plastic Surgery

## 2023-11-29 NOTE — Progress Notes (Addendum)
 Cardiology Office Note    Patient Name: Ann Stone Date of Encounter: 11/29/2023  Primary Care Provider:  Lovie Clarity, MD Primary Cardiologist:  Alvan Ronal BRAVO, MD Primary Electrophysiologist: None   Past Medical History    Past Medical History:  Diagnosis Date   Breast pain 09/08/2017   Depression    Diabetes (HCC)    GERD (gastroesophageal reflux disease)    Hair loss 05/30/2021   HTN (hypertension)    Hyperlipemia    Keloid 05/20/2022   Leiomyoma of uterus    s/p hysterectomy, hx of uterine hemorrhage   Sebaceous cyst 10/08/2020   Tobacco abuse    Vaginal mass     History of Present Illness  Ann Stone is a 58 y.o. female with a PMH of primary HTN, HLD, GERD, DM type II, former tobacco abuse who presents today for preoperative clearance.  Mr. Satterwhite was seen initially by Dr. Alvan on 06/26/2021 for referral of tachycardia.  She had EKG completed that showed sinus rhythm with LVH and blood pressure was stable at 132/84.  She was ordered a 7-day ZIO monitor however this was not completed.  She was currently following a nutritionist for management of cholesterol.  She has been lost to follow-up since 2022 and presents today for preoperative clearance.  Ann Stone presents today for preoperative clearance and overdue follow-up.  She is scheduled for keloid removal surgery on her ear and requires preoperative clearance. She has a history of tachycardia, initially evaluated in Dec 31, 2022with heart rates ranging from 100 to 123 beats per minute. She experiences shortness of breath, which she associates with the initiation of amlodipine , occurring during activities such as showering and subsiding afterward. Her current medications include amlodipine , Zestoretic , Crestor , ezetimibe , and Trulicity . Her cholesterol levels are not at goal, and her primary doctor plans to retest her cholesterol levels after ensuring consistent medication intake. She has not completed an event  monitor study previously recommended to evaluate her tachycardia. No chest pain or dizziness. She reports a weight gain of 10-15 pounds since her husband's death in 2024/07/13. She has a history of smoking, having quit five years ago after smoking for approximately fifteen years. Her thyroid  function is stable but on the elevated side, which is being monitored in relation to her tachycardia. Patient denies chest pain, palpitations, dyspnea, PND, orthopnea, nausea, vomiting, dizziness, syncope, edema, weight gain, or early satiety.  Discussed the use of AI scribe software for clinical note transcription with the patient, who gave verbal consent to proceed.  History of Present Illness   Review of Systems  Please see the history of present illness.    All other systems reviewed and are otherwise negative except as noted above.  Physical Exam     Wt Readings from Last 3 Encounters:  11/04/23 200 lb (90.7 kg)  10/14/23 210 lb 1.6 oz (95.3 kg)  09/09/23 209 lb 4.8 oz (94.9 kg)   CD:Uyzmz were no vitals filed for this visit.,There is no height or weight on file to calculate BMI. GEN: Well nourished, well developed in no acute distress Neck: No JVD; No carotid bruits Pulmonary: Clear to auscultation without rales, wheezing or rhonchi  Cardiovascular: Tachycardia regular rhythm. Normal S1. Normal S2.   Murmurs: There is no murmur.  ABDOMEN: Soft, non-tender, non-distended EXTREMITIES:  No edema; No deformity   EKG/LABS/ Recent Cardiac Studies   ECG personally reviewed by me today -sinus tachycardia with rate of 104 bpm and no acute changes consistent with  previous EKG.  Risk Assessment/Calculations:          Lab Results  Component Value Date   WBC 3.3 (L) 01/19/2023   HGB 15.6 01/19/2023   HCT 44.4 01/19/2023   MCV 89 01/19/2023   PLT 236 01/19/2023   Lab Results  Component Value Date   CREATININE 1.01 (H) 08/04/2023   BUN 24 08/04/2023   NA 138 08/04/2023   K 5.0 08/04/2023    CL 96 08/04/2023   CO2 23 08/04/2023   Lab Results  Component Value Date   CHOL 232 (H) 10/14/2023   HDL 55 10/14/2023   LDLCALC 149 (H) 10/14/2023   TRIG 155 (H) 10/14/2023   CHOLHDL 4.2 10/14/2023    Lab Results  Component Value Date   HGBA1C 6.1 (A) 08/04/2023   Assessment & Plan    Assessment & Plan   1.  Preoperative clearance: - Patient's RCRI score is 0.9% The patient affirms she has been doing well without any new cardiac symptoms. They are able to achieve 7 METS without cardiac limitations. Therefore, based on ACC/AHA guidelines, the patient would be at acceptable risk for the planned procedure without further cardiovascular testing. The patient was advised that if she develops new symptoms prior to surgery to contact our office to arrange for a follow-up visit, and she verbalized understanding.   Patient completed ZIO monitor as well as echo showing no acute changes in LV function.  Please follow-up with PCP regarding advisement on holding Trulicity .  2.  Essential hypertension: - Patient's blood pressure was stable at 99/67 - Continue Zestoretic  20-12.5 mg and amlodipine  5 mg daily  3.  Hyperlipidemia: - Cholesterol not at goal, managed by primary care physician. Alternatives to statins may be considered if uncontrolled. - Await retesting of cholesterol levels by primary care physician. - Consider referral to pharmacist for alternative lipid-lowering therapies if uncontrolled. - Continue Crestor  40 mg daily  4.  Sinus tachycardia Chronic tachycardia with heart rate 100-123 bpm. Shortness of breath possibly linked to amlodipine . Differential includes deconditioning or cardiac issues. Echocardiogram and event monitor needed. Surgical risk increased with anesthesia, low major cardiac event risk at 0.9%. - Order echocardiogram to assess cardiac structure and valve function. - Order Zio monitor for 7 days to evaluate heart rhythm and identify triggers. - Consider beta  blocker if monitor shows persistent high heart rate.  5.  History of tobacco abuse: Former smoker with 15-year history, quit 5 years ago. Consideration for coronary calcium  score due to smoking, hypertension, and hyperlipidemia. - Consider coronary calcium  score in the future to assess coronary artery calcium  buildup.     Disposition: Follow-up with Alvan Ronal BRAVO, MD or APP in 12 months    Signed, Wyn Raddle, Jackee Shove, NP 11/29/2023, 4:26 PM [facility]

## 2023-11-30 ENCOUNTER — Ambulatory Visit: Attending: Nurse Practitioner

## 2023-11-30 ENCOUNTER — Other Ambulatory Visit: Payer: Self-pay | Admitting: Nurse Practitioner

## 2023-11-30 ENCOUNTER — Ambulatory Visit: Attending: Nurse Practitioner | Admitting: Nurse Practitioner

## 2023-11-30 ENCOUNTER — Encounter: Payer: Self-pay | Admitting: Nurse Practitioner

## 2023-11-30 VITALS — BP 99/67 | HR 104 | Ht 65.0 in | Wt 208.2 lb

## 2023-11-30 DIAGNOSIS — R000 Tachycardia, unspecified: Secondary | ICD-10-CM

## 2023-11-30 DIAGNOSIS — I10 Essential (primary) hypertension: Secondary | ICD-10-CM

## 2023-11-30 DIAGNOSIS — Z01810 Encounter for preprocedural cardiovascular examination: Secondary | ICD-10-CM

## 2023-11-30 DIAGNOSIS — E785 Hyperlipidemia, unspecified: Secondary | ICD-10-CM

## 2023-11-30 DIAGNOSIS — Z87891 Personal history of nicotine dependence: Secondary | ICD-10-CM

## 2023-11-30 NOTE — Patient Instructions (Signed)
 Medication Instructions:  Your physician recommends that you continue on your current medications as directed. Please refer to the Current Medication list given to you today. *If you need a refill on your cardiac medications before your next appointment, please call your pharmacy*  Lab Work: None ordered If you have labs (blood work) drawn today and your tests are completely normal, you will receive your results only by: MyChart Message (if you have MyChart) OR A paper copy in the mail If you have any lab test that is abnormal or we need to change your treatment, we will call you to review the results.  Testing/Procedures: Your physician has requested that you have an echocardiogram. Echocardiography is a painless test that uses sound waves to create images of your heart. It provides your doctor with information about the size and shape of your heart and how well your heart's chambers and valves are working. This procedure takes approximately one hour. There are no restrictions for this procedure. Please do NOT wear cologne, perfume, aftershave, or lotions (deodorant is allowed). Please arrive 15 minutes prior to your appointment time.  Please note: We ask at that you not bring children with you during ultrasound (echo/ vascular) testing. Due to room size and safety concerns, children are not allowed in the ultrasound rooms during exams. Our front office staff cannot provide observation of children in our lobby area while testing is being conducted. An adult accompanying a patient to their appointment will only be allowed in the ultrasound room at the discretion of the ultrasound technician under special circumstances. We apologize for any inconvenience.  ZIO XT- Long Term Monitor Instructions  Your physician has requested you wear a ZIO patch monitor for 14 days.  This is a single patch monitor. [employer] supplies one patch monitor per enrollment. Additional stickers are not available. Please do  not apply patch if you will be having a Nuclear Stress Test,  Echocardiogram, Cardiac CT, MRI, or Chest Xray during the period you would be wearing the  monitor. The patch cannot be worn during these tests. You cannot remove and re-apply the  ZIO XT patch monitor.  Your ZIO patch monitor will be mailed 3 day USPS to your address on file. It may take 3-5 days  to receive your monitor after you have been enrolled.  Once you have received your monitor, please review the enclosed instructions. Your monitor  has already been registered assigning a specific monitor serial # to you.  Billing and Patient Assistance Program Information  We have supplied [employer] with any of your insurance information on file for billing purposes. [employer] offers a sliding scale Patient Assistance Program for patients that do not have  insurance, or whose insurance does not completely cover the cost of the ZIO monitor.  You must apply for the Patient Assistance Program to qualify for this discounted rate.  To apply, please call [employer] at [phone], select option 4, select option 2, ask to apply for  Patient Assistance Program. Sanna Crystal will ask your household income, and how many people  are in your household. They will quote your out-of-pocket cost based on that information.  [employer] will also be able to set up a 37-month, interest-free payment plan if needed.  Applying the monitor   Shave hair from upper left chest.  Hold abrader disc by orange tab. Rub abrader in 40 strokes over the upper left chest as  indicated in your monitor instructions.  Clean area with 4 enclosed alcohol pads. Let dry.  Apply patch as indicated in monitor instructions. Patch will be placed under collarbone on left  side of chest with arrow pointing upward.  Rub patch adhesive wings for 2 minutes. Remove white label marked "1". Remove the white  label marked "2". Rub patch adhesive wings for 2 additional minutes.  While looking in a  mirror, press and release button in center of patch. A small green light will  flash 3-4 times. This will be your only indicator that the monitor has been turned on.  Do not shower for the first 24 hours. You may shower after the first 24 hours.  Press the button if you feel a symptom. You will hear a small click. Record Date, Time and  Symptom in the Patient Logbook.  When you are ready to remove the patch, follow instructions on the last 2 pages of Patient  Logbook. Stick patch monitor onto the last page of Patient Logbook.  Place Patient Logbook in the blue and white box. Use locking tab on box and tape box closed  securely. The blue and white box has prepaid postage on it. Please place it in the mailbox as  soon as possible. Your physician should have your test results approximately 7 days after the  monitor has been mailed back to [facility].  Call [facility] Customer Care at [phone] if you have questions regarding  your ZIO XT patch monitor. Call them immediately if you see an orange light blinking on your  monitor.  If your monitor falls off in less than 4 days, contact our Monitor department at [phone].  If your monitor becomes loose or falls off after 4 days call [employer] at [phone] for  suggestions on securing your monitor   Follow-Up: At [facility], you and your health needs are our priority.  As part of our continuing mission to provide you with exceptional heart care, our providers are all part of one team.  This team includes your primary Cardiologist (physician) and Advanced Practice Providers or APPs (Physician Assistants and Nurse Practitioners) who all work together to provide you with the care you need, when you need it.  Your next appointment:   6 month(s)  Provider:   Christena Covert, MD    We recommend signing up for the patient portal called "MyChart".  Sign up information is provided on this After Visit Summary.  MyChart is used  to connect with patients for Virtual Visits (Telemedicine).  Patients are able to view lab/test results, encounter notes, upcoming appointments, etc.  Non-urgent messages can be sent to your provider as well.   To learn more about what you can do with MyChart, go to [email or web address].   Other Instructions

## 2023-11-30 NOTE — Progress Notes (Unsigned)
 Enrolled for [employer] to mail a ZIO XT long term holter monitor to the patients address on file.   DOD to read.

## 2023-12-09 ENCOUNTER — Encounter: Admitting: Surgical

## 2023-12-10 ENCOUNTER — Other Ambulatory Visit: Payer: Self-pay

## 2023-12-10 MED ORDER — TRULICITY 3 MG/0.5ML ~~LOC~~ SOAJ: 3.0000 mg | SUBCUTANEOUS | 3 refills | Status: AC

## 2023-12-18 ENCOUNTER — Telehealth: Payer: Self-pay | Admitting: Radiation Oncology

## 2023-12-18 NOTE — Telephone Encounter (Signed)
 6/6 @ 9:22 am Follow up call to Dr. Meridith Stanford office spoke to [facility], Surgery Coordinator.  Patient still has not been cleared from Cardiology. Will resend referral once patient is cleared and has new surgery date.  Closing referral at this time.

## 2023-12-21 ENCOUNTER — Other Ambulatory Visit: Payer: Self-pay | Admitting: Internal Medicine

## 2023-12-21 DIAGNOSIS — F331 Major depressive disorder, recurrent, moderate: Secondary | ICD-10-CM

## 2023-12-21 NOTE — Telephone Encounter (Signed)
 Last Fill: 10/23/23  Last OV: 10/14/23 Next OV: None Scheduled  Routing to provider for review/authorization.

## 2023-12-21 NOTE — Telephone Encounter (Unsigned)
 Copied from CRM [id number]. Topic: Clinical - Medication Refill >> Dec 21, 2023  2:53 PM Tisa Forester wrote: Medication: sertraline  (ZOLOFT ) 50 MG tablet  Has the patient contacted their pharmacy? Yes (Agent: If no, request that the patient contact the pharmacy for the refill. If patient does not wish to contact the pharmacy document the reason why and proceed with request.) (Agent: If yes, when and what did the pharmacy advise?)  This is the patient's preferred pharmacy:  [id number] - [place] - [address] [place] ([place]) [place] [address] Phone: [phone] Fax: [phone]  Is this the correct pharmacy for this prescription? Yes If no, delete pharmacy and type the correct one.   Has the prescription been filled recently? No  Is the patient out of the medication? Yes  Has the patient been seen for an appointment in the last year OR does the patient have an upcoming appointment? Yes  Can we respond through MyChart? Yes  Agent: Please be advised that Rx refills may take up to 3 business days. We ask that you follow-up with your pharmacy.

## 2023-12-22 MED ORDER — SERTRALINE HCL 50 MG PO TABS: 50.0000 mg | ORAL_TABLET | Freq: Every day | ORAL | 0 refills | Status: AC

## 2023-12-28 ENCOUNTER — Ambulatory Visit: Payer: Self-pay | Admitting: Nurse Practitioner

## 2023-12-28 DIAGNOSIS — R000 Tachycardia, unspecified: Secondary | ICD-10-CM

## 2023-12-28 DIAGNOSIS — I10 Essential (primary) hypertension: Secondary | ICD-10-CM | POA: Diagnosis not present

## 2023-12-28 DIAGNOSIS — Z01810 Encounter for preprocedural cardiovascular examination: Secondary | ICD-10-CM | POA: Diagnosis not present

## 2023-12-30 ENCOUNTER — Encounter: Admitting: Surgical

## 2024-01-08 ENCOUNTER — Ambulatory Visit: Payer: Self-pay | Admitting: Nurse Practitioner

## 2024-01-08 ENCOUNTER — Ambulatory Visit (HOSPITAL_COMMUNITY)
Admission: RE | Admit: 2024-01-08 | Discharge: 2024-01-08 | Disposition: A | Discharge status: Home / Self Care | Source: Ambulatory Visit | Attending: Cardiology | Admitting: Cardiology

## 2024-01-08 DIAGNOSIS — E785 Hyperlipidemia, unspecified: Secondary | ICD-10-CM | POA: Diagnosis not present

## 2024-01-08 DIAGNOSIS — Z01810 Encounter for preprocedural cardiovascular examination: Secondary | ICD-10-CM | POA: Diagnosis present

## 2024-01-08 DIAGNOSIS — R000 Tachycardia, unspecified: Secondary | ICD-10-CM

## 2024-01-08 DIAGNOSIS — I10 Essential (primary) hypertension: Secondary | ICD-10-CM

## 2024-01-08 LAB — ECHOCARDIOGRAM COMPLETE
Area-P 1/2: 4.24 cm2
P 1/2 time: 330 ms
S' Lateral: 2.8 cm

## 2024-01-23 ENCOUNTER — Other Ambulatory Visit: Payer: Self-pay | Admitting: Student

## 2024-01-23 DIAGNOSIS — I10 Essential (primary) hypertension: Secondary | ICD-10-CM

## 2024-01-25 ENCOUNTER — Encounter: Payer: Self-pay | Admitting: *Deleted

## 2024-02-05 ENCOUNTER — Other Ambulatory Visit: Payer: Self-pay

## 2024-02-05 ENCOUNTER — Telehealth: Payer: Self-pay | Admitting: *Deleted

## 2024-02-05 MED ORDER — AMLODIPINE BESYLATE 5 MG PO TABS: 5.0000 mg | ORAL_TABLET | Freq: Every day | ORAL | 1 refills | Status: AC

## 2024-02-05 MED ORDER — METFORMIN HCL ER 500 MG PO TB24: 500.0000 mg | ORAL_TABLET | Freq: Two times a day (BID) | ORAL | 1 refills | Status: DC

## 2024-02-05 NOTE — Telephone Encounter (Signed)
  Will forward to PCP and to [facility] to see if patient will qualify for patient assistance.    Copied from CRM [id number]. Topic: Clinical - Prescription Issue >> Feb 05, 2024  9:32 AM Zane F wrote: Reason for CRM:   Patient is calling in due to her insurance no longer being active and noting that she cannot cover her Dulaglutide  (TRULICITY ) 3 MG/0.5ML SOAJ out of pocket. The patient would like clinical advice on whether her PCP thinks it would be best for her to restart the metFORMIN  (GLUCOPHAGE ) 500 MG tablet prescription instead due to cost.   If the clinical staff agrees with continuing the metformin . Please submit prescription to her preferred pharmacy and contact the patient with an update.   Preferred Pharmacy:  [facility] [address] [place] - [address] [place] ([place]) [place] [address] Phone: [phone]  Fax: [phone]    Callback Number:[phone]

## 2024-02-05 NOTE — Telephone Encounter (Unsigned)
 Copied from CRM #[id number]. Topic: Clinical - Medication Refill >> Feb 05, 2024  9:38 AM Zane F wrote: Patient is completely out of the prescription listed. Please refill. Patient has not taken the prescription in a week so far.    Medication: amLODipine  (NORVASC ) 5 MG tablet  Has the patient contacted their pharmacy? Yes   This is the patient's preferred pharmacy:   [id number] - [place] ([place]), [place] - [address] [place] ([place]) [place] [address] Phone: [phone] Fax: [phone]   Is this the correct pharmacy for this prescription? Yes  Has the prescription been filled recently? No  Is the patient out of the medication? Yes  Has the patient been seen for an appointment in the last year OR does the patient have an upcoming appointment? Yes  Can we respond through MyChart? Yes  Agent: Please be advised that Rx refills may take up to 3 business days. We ask that you follow-up with your pharmacy.

## 2024-02-05 NOTE — Telephone Encounter (Signed)
 Unfortunately no longer able to access Trulicity .  In past tolerated metformin  for years but then did have episode of diarrhea and was switched to trulicity . Will go back to metformin   Sent her mychart message

## 2024-05-17 ENCOUNTER — Other Ambulatory Visit: Payer: Self-pay | Admitting: Family Medicine
